# Patient Record
Sex: Female | Born: 1940 | Race: White | Hispanic: No | Smoking: Former smoker
Health system: Southern US, Community
[De-identification: ages and names within clinical notes are randomized; demographics above are authoritative.]

## PROBLEM LIST (undated history)

## (undated) DIAGNOSIS — K219 Gastro-esophageal reflux disease without esophagitis: Secondary | ICD-10-CM

## (undated) DIAGNOSIS — J449 Chronic obstructive pulmonary disease, unspecified: Secondary | ICD-10-CM

## (undated) DIAGNOSIS — I4891 Unspecified atrial fibrillation: Secondary | ICD-10-CM

## (undated) DIAGNOSIS — I10 Essential (primary) hypertension: Secondary | ICD-10-CM

---

## 2000-07-09 ENCOUNTER — Other Ambulatory Visit: Admission: RE | Admit: 2000-07-09 | Discharge: 2000-07-09 | Payer: Self-pay | Admitting: Obstetrics and Gynecology

## 2001-11-05 ENCOUNTER — Other Ambulatory Visit: Admission: RE | Admit: 2001-11-05 | Discharge: 2001-11-05 | Payer: Self-pay | Admitting: Obstetrics and Gynecology

## 2002-11-09 ENCOUNTER — Other Ambulatory Visit: Admission: RE | Admit: 2002-11-09 | Discharge: 2002-11-09 | Payer: Self-pay | Admitting: Obstetrics and Gynecology

## 2005-05-18 ENCOUNTER — Other Ambulatory Visit: Admission: RE | Admit: 2005-05-18 | Discharge: 2005-05-18 | Payer: Self-pay | Admitting: Obstetrics and Gynecology

## 2005-08-17 ENCOUNTER — Ambulatory Visit (HOSPITAL_BASED_OUTPATIENT_CLINIC_OR_DEPARTMENT_OTHER): Admission: RE | Admit: 2005-08-17 | Discharge: 2005-08-17 | Payer: Self-pay | Admitting: Urology

## 2006-05-17 ENCOUNTER — Ambulatory Visit (HOSPITAL_COMMUNITY): Admission: RE | Admit: 2006-05-17 | Discharge: 2006-05-17 | Payer: Self-pay | Admitting: *Deleted

## 2011-07-13 ENCOUNTER — Other Ambulatory Visit: Payer: Self-pay | Admitting: Gynecology

## 2014-04-26 ENCOUNTER — Emergency Department (HOSPITAL_COMMUNITY)
Admission: EM | Admit: 2014-04-26 | Discharge: 2014-04-26 | Disposition: A | Discharge status: Home / Self Care | Payer: Medicare Other | Source: Home / Self Care | Attending: Family Medicine | Admitting: Family Medicine

## 2014-04-26 ENCOUNTER — Encounter (HOSPITAL_COMMUNITY): Payer: Self-pay | Admitting: Emergency Medicine

## 2014-04-26 DIAGNOSIS — S61219A Laceration without foreign body of unspecified finger without damage to nail, initial encounter: Secondary | ICD-10-CM | POA: Diagnosis not present

## 2014-04-26 HISTORY — DX: Essential (primary) hypertension: I10

## 2014-04-26 NOTE — ED Notes (Signed)
Pt states she cut her right finger on a can of beans about 1100 this morning.  Pt denies any pain.

## 2014-04-26 NOTE — Discharge Instructions (Signed)
Keep clean and dry, avoid water for 2-3 days, return as needed.

## 2014-04-26 NOTE — ED Provider Notes (Signed)
CSN: [id number]     Arrival date & time 04/26/14  1223 History   First MD Initiated Contact with Patient 04/26/14 1357     Chief Complaint  Patient presents with  . Laceration    right finger   (Consider location/radiation/quality/duration/timing/severity/associated sxs/prior Treatment) Patient is a 74 y.o. female presenting with skin laceration. The history is provided by the patient.  Laceration Location:  Finger Finger laceration location:  R index finger Depth:  Cutaneous Quality: straight   Bleeding: controlled   Time since incident:  3 hours Laceration mechanism:  Metal edge Foreign body present:  No foreign bodies Tetanus status:  Up to date   Past Medical History  Diagnosis Date  . Hypertension    History reviewed. No pertinent past surgical history. History reviewed. No pertinent family history. History  Substance Use Topics  . Smoking status: Never Smoker   . Smokeless tobacco: Never Used  . Alcohol Use: No   OB History    No data available     Review of Systems  Constitutional: Negative.   Skin: Positive for wound.    Allergies  Review of patient's allergies indicates no known allergies.  Home Medications   Prior to Admission medications   Medication Sig Start Date End Date Taking? Authorizing Provider  lisinopril (PRINIVIL,ZESTRIL) 10 MG tablet Take 10 mg by mouth daily.   Yes Historical Provider, MD   BP 165/80 mmHg  Pulse 79  Temp(Src) 97.9 F (36.6 C) (Oral)  Resp 14  SpO2 95% Physical Exam  Constitutional: She is oriented to person, place, and time. She appears well-developed and well-nourished.  Musculoskeletal: Normal range of motion.  Neurological: She is alert and oriented to person, place, and time.  Skin: Skin is warm and dry.  Volar distal phalanx lac of rif., nvt fxn intact.  Nursing note and vitals reviewed.   ED Course  LACERATION REPAIR Date/Time: 04/26/2014 2:04 PM Performed by: Linna Hoff Authorized by: Bradd Canary  D Consent: Verbal consent obtained. Risks and benefits: risks, benefits and alternatives were discussed Consent given by: patient Body area: upper extremity Location details: right index finger Laceration length: 1.5 cm Foreign bodies: no foreign bodies Tendon involvement: none Nerve involvement: none Vascular damage: yes Patient sedated: no Preparation: Patient was prepped and draped in the usual sterile fashion. Irrigation solution: saline Amount of cleaning: standard Debridement: minimal Degree of undermining: none Skin closure: glue Technique: simple Approximation: close Approximation difficulty: simple Patient tolerance: Patient tolerated the procedure well with no immediate complications   (including critical care time) Labs Review Labs Reviewed - No data to display  Imaging Review No results found.   MDM   1. Finger laceration, initial encounter        Linna Hoff, MD 04/26/14 1415

## 2014-07-15 DIAGNOSIS — H40013 Open angle with borderline findings, low risk, bilateral: Secondary | ICD-10-CM | POA: Diagnosis not present

## 2014-10-07 DIAGNOSIS — M859 Disorder of bone density and structure, unspecified: Secondary | ICD-10-CM | POA: Diagnosis not present

## 2014-10-07 DIAGNOSIS — I10 Essential (primary) hypertension: Secondary | ICD-10-CM | POA: Diagnosis not present

## 2014-10-14 DIAGNOSIS — M8580 Other specified disorders of bone density and structure, unspecified site: Secondary | ICD-10-CM | POA: Diagnosis not present

## 2014-10-14 DIAGNOSIS — I129 Hypertensive chronic kidney disease with stage 1 through stage 4 chronic kidney disease, or unspecified chronic kidney disease: Secondary | ICD-10-CM | POA: Diagnosis not present

## 2014-10-14 DIAGNOSIS — E6609 Other obesity due to excess calories: Secondary | ICD-10-CM | POA: Diagnosis not present

## 2014-10-14 DIAGNOSIS — N182 Chronic kidney disease, stage 2 (mild): Secondary | ICD-10-CM | POA: Diagnosis not present

## 2015-01-13 DIAGNOSIS — H40013 Open angle with borderline findings, low risk, bilateral: Secondary | ICD-10-CM | POA: Diagnosis not present

## 2015-06-02 DIAGNOSIS — M4316 Spondylolisthesis, lumbar region: Secondary | ICD-10-CM | POA: Diagnosis not present

## 2015-06-02 DIAGNOSIS — M5136 Other intervertebral disc degeneration, lumbar region: Secondary | ICD-10-CM | POA: Diagnosis not present

## 2015-06-02 DIAGNOSIS — M4806 Spinal stenosis, lumbar region: Secondary | ICD-10-CM | POA: Diagnosis not present

## 2015-06-14 DIAGNOSIS — M4806 Spinal stenosis, lumbar region: Secondary | ICD-10-CM | POA: Diagnosis not present

## 2015-06-16 DIAGNOSIS — I129 Hypertensive chronic kidney disease with stage 1 through stage 4 chronic kidney disease, or unspecified chronic kidney disease: Secondary | ICD-10-CM | POA: Diagnosis not present

## 2015-06-16 DIAGNOSIS — N182 Chronic kidney disease, stage 2 (mild): Secondary | ICD-10-CM | POA: Diagnosis not present

## 2015-06-16 DIAGNOSIS — M8580 Other specified disorders of bone density and structure, unspecified site: Secondary | ICD-10-CM | POA: Diagnosis not present

## 2015-06-20 DIAGNOSIS — E875 Hyperkalemia: Secondary | ICD-10-CM | POA: Diagnosis not present

## 2015-06-20 DIAGNOSIS — Z0000 Encounter for general adult medical examination without abnormal findings: Secondary | ICD-10-CM | POA: Diagnosis not present

## 2015-06-20 DIAGNOSIS — R7989 Other specified abnormal findings of blood chemistry: Secondary | ICD-10-CM | POA: Diagnosis not present

## 2015-06-30 DIAGNOSIS — Z0000 Encounter for general adult medical examination without abnormal findings: Secondary | ICD-10-CM | POA: Diagnosis not present

## 2015-06-30 DIAGNOSIS — E875 Hyperkalemia: Secondary | ICD-10-CM | POA: Diagnosis not present

## 2015-06-30 DIAGNOSIS — R7989 Other specified abnormal findings of blood chemistry: Secondary | ICD-10-CM | POA: Diagnosis not present

## 2015-06-30 DIAGNOSIS — I10 Essential (primary) hypertension: Secondary | ICD-10-CM | POA: Diagnosis not present

## 2015-07-02 DIAGNOSIS — Z1212 Encounter for screening for malignant neoplasm of rectum: Secondary | ICD-10-CM | POA: Diagnosis not present

## 2015-07-02 DIAGNOSIS — Z1211 Encounter for screening for malignant neoplasm of colon: Secondary | ICD-10-CM | POA: Diagnosis not present

## 2015-08-03 DIAGNOSIS — E0590 Thyrotoxicosis, unspecified without thyrotoxic crisis or storm: Secondary | ICD-10-CM | POA: Diagnosis not present

## 2015-08-03 DIAGNOSIS — I10 Essential (primary) hypertension: Secondary | ICD-10-CM | POA: Diagnosis not present

## 2015-08-04 ENCOUNTER — Other Ambulatory Visit (HOSPITAL_COMMUNITY): Payer: Self-pay | Admitting: Internal Medicine

## 2015-08-04 ENCOUNTER — Other Ambulatory Visit (HOSPITAL_COMMUNITY): Payer: Self-pay | Admitting: Endocrinology

## 2015-08-04 DIAGNOSIS — E0590 Thyrotoxicosis, unspecified without thyrotoxic crisis or storm: Secondary | ICD-10-CM

## 2015-08-15 ENCOUNTER — Encounter (HOSPITAL_COMMUNITY)
Admission: RE | Admit: 2015-08-15 | Discharge: 2015-08-15 | Disposition: A | Discharge status: Home / Self Care | Payer: Medicare Other | Source: Ambulatory Visit | Attending: Internal Medicine | Admitting: Internal Medicine

## 2015-08-15 DIAGNOSIS — E0590 Thyrotoxicosis, unspecified without thyrotoxic crisis or storm: Secondary | ICD-10-CM | POA: Insufficient documentation

## 2015-08-15 MED ORDER — SODIUM IODIDE I 131 CAPSULE
9.4000 | Freq: Once | INTRAVENOUS | Status: AC | PRN
  Administered 2015-08-15: 9.4 via ORAL

## 2015-08-16 ENCOUNTER — Encounter (HOSPITAL_COMMUNITY)
Admission: RE | Admit: 2015-08-16 | Discharge: 2015-08-16 | Disposition: A | Discharge status: Home / Self Care | Payer: Medicare Other | Source: Ambulatory Visit | Attending: Internal Medicine | Admitting: Internal Medicine

## 2015-08-16 DIAGNOSIS — E0590 Thyrotoxicosis, unspecified without thyrotoxic crisis or storm: Secondary | ICD-10-CM | POA: Diagnosis not present

## 2015-08-16 MED ORDER — SODIUM PERTECHNETATE TC 99M INJECTION
9.2800 | Freq: Once | INTRAVENOUS | Status: AC | PRN
  Administered 2015-08-16: 9 via INTRAVENOUS

## 2015-08-24 DIAGNOSIS — H40053 Ocular hypertension, bilateral: Secondary | ICD-10-CM | POA: Diagnosis not present

## 2015-08-24 DIAGNOSIS — H40013 Open angle with borderline findings, low risk, bilateral: Secondary | ICD-10-CM | POA: Diagnosis not present

## 2015-09-29 DIAGNOSIS — E0590 Thyrotoxicosis, unspecified without thyrotoxic crisis or storm: Secondary | ICD-10-CM | POA: Diagnosis not present

## 2015-09-29 DIAGNOSIS — I10 Essential (primary) hypertension: Secondary | ICD-10-CM | POA: Diagnosis not present

## 2015-10-03 DIAGNOSIS — I10 Essential (primary) hypertension: Secondary | ICD-10-CM | POA: Diagnosis not present

## 2015-10-03 DIAGNOSIS — E0590 Thyrotoxicosis, unspecified without thyrotoxic crisis or storm: Secondary | ICD-10-CM | POA: Diagnosis not present

## 2015-10-11 ENCOUNTER — Encounter (HOSPITAL_COMMUNITY): Payer: Self-pay | Admitting: Family Medicine

## 2015-10-11 ENCOUNTER — Emergency Department (HOSPITAL_COMMUNITY)
Admission: EM | Admit: 2015-10-11 | Discharge: 2015-10-11 | Disposition: A | Discharge status: Home / Self Care | Payer: Medicare Other | Attending: Emergency Medicine | Admitting: Emergency Medicine

## 2015-10-11 ENCOUNTER — Emergency Department (HOSPITAL_COMMUNITY): Payer: Medicare Other

## 2015-10-11 DIAGNOSIS — S2231XA Fracture of one rib, right side, initial encounter for closed fracture: Secondary | ICD-10-CM

## 2015-10-11 DIAGNOSIS — I10 Essential (primary) hypertension: Secondary | ICD-10-CM | POA: Insufficient documentation

## 2015-10-11 DIAGNOSIS — S299XXA Unspecified injury of thorax, initial encounter: Secondary | ICD-10-CM | POA: Diagnosis present

## 2015-10-11 DIAGNOSIS — X509XXA Other and unspecified overexertion or strenuous movements or postures, initial encounter: Secondary | ICD-10-CM | POA: Insufficient documentation

## 2015-10-11 DIAGNOSIS — Y939 Activity, unspecified: Secondary | ICD-10-CM | POA: Diagnosis not present

## 2015-10-11 DIAGNOSIS — Y929 Unspecified place or not applicable: Secondary | ICD-10-CM | POA: Diagnosis not present

## 2015-10-11 DIAGNOSIS — Y990 Civilian activity done for income or pay: Secondary | ICD-10-CM | POA: Insufficient documentation

## 2015-10-11 IMAGING — DX DG RIBS W/ CHEST 3+V*R*
3 series · 3 of 3 positions shown · non-contrast
Comparison: [DATE]

CLINICAL DATA: Right rib pain after reaching and stocking shelves
at work.

EXAM:
RIGHT RIBS AND CHEST - 3+ VIEW

[chest pa]
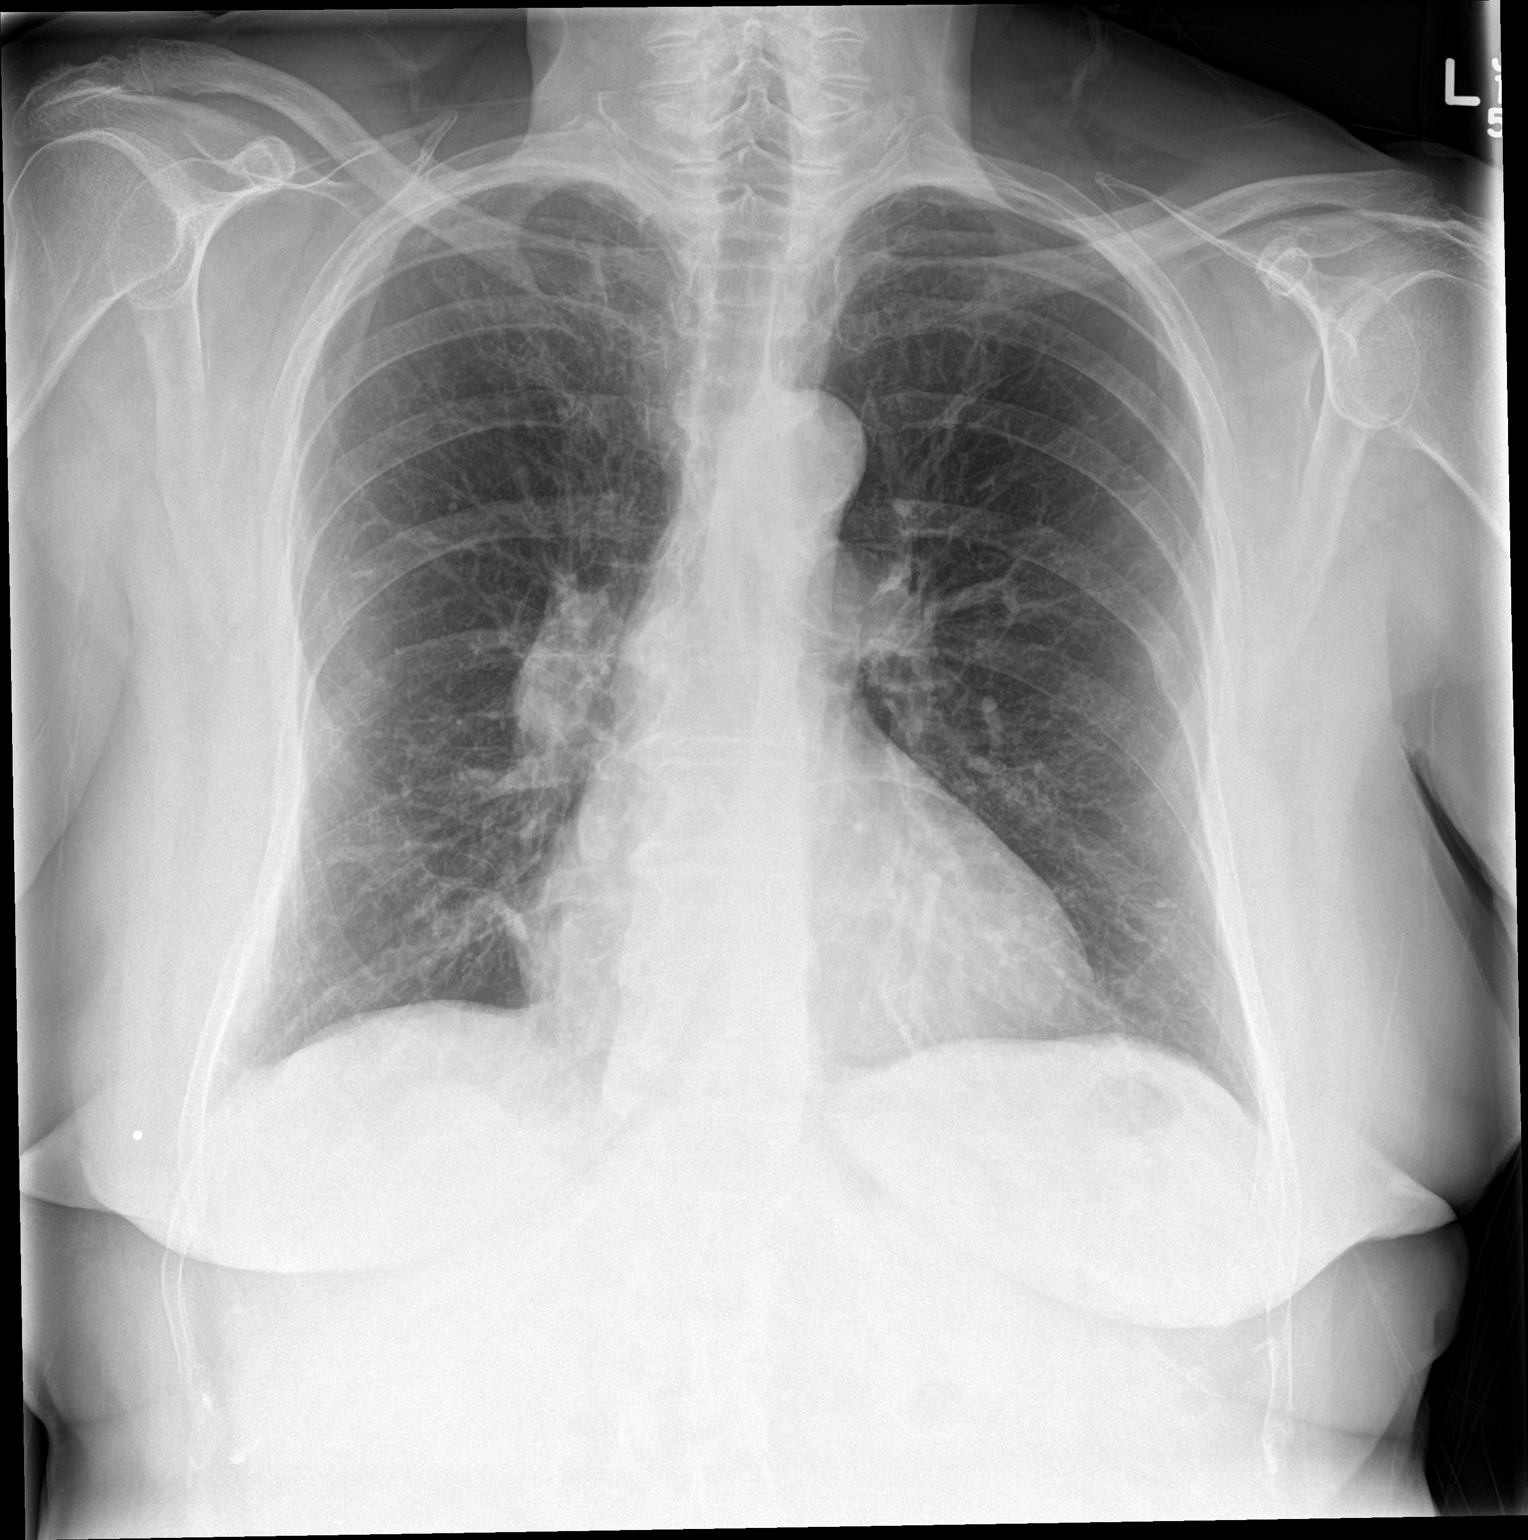

[rib ap]
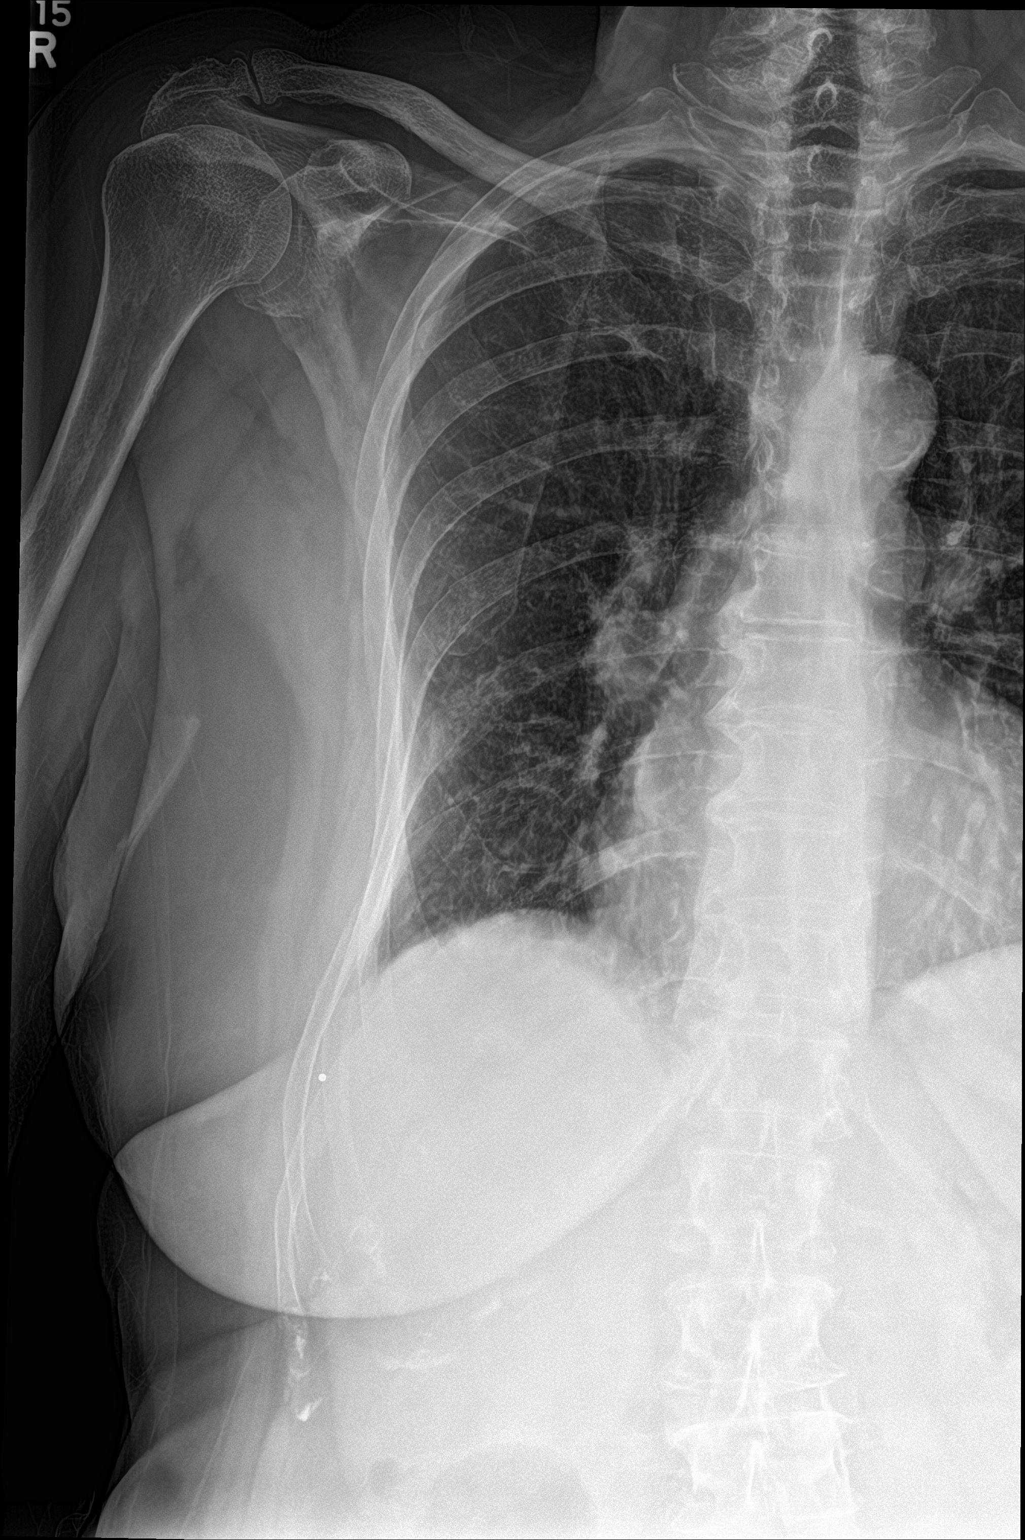

[rib ap obl]
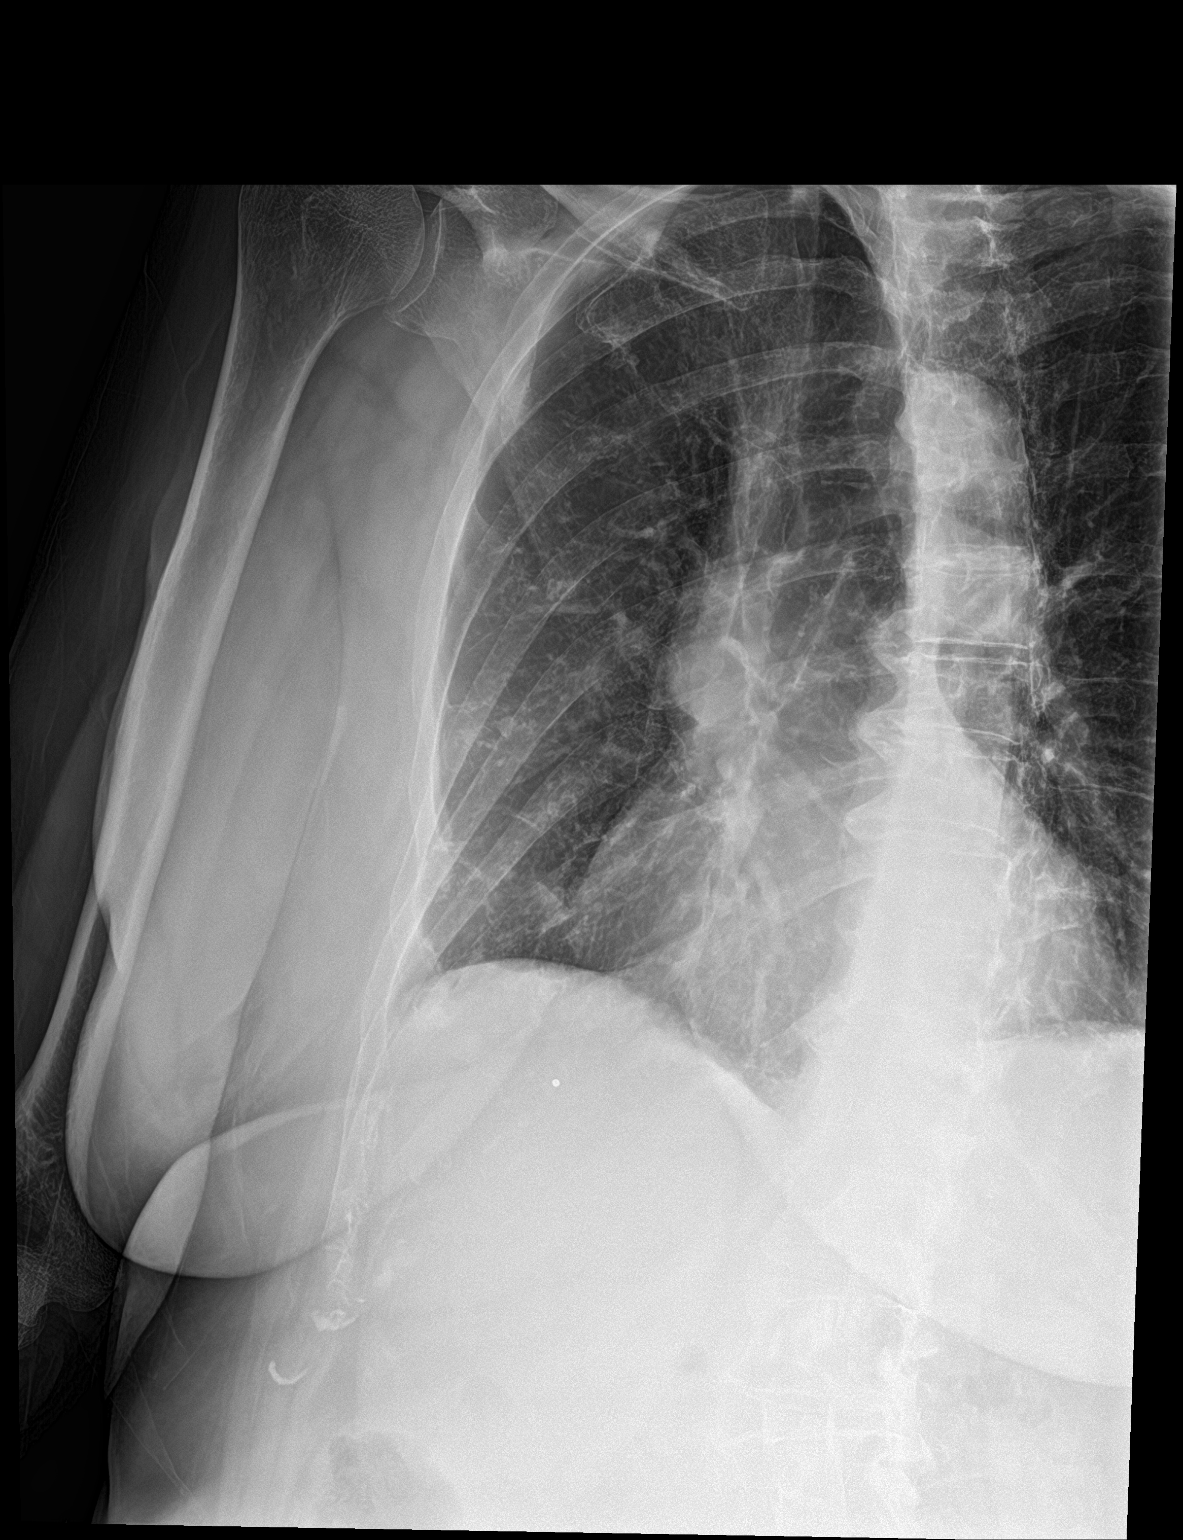

[3 of 3 positions shown; findings below may reference images not displayed]

FINDINGS: Three views right ribs submitted. No infiltrate or pulmonary edema.
There is subtle nondisplaced fracture of the right seventh rib. No
pneumothorax. Question nondisplaced fracture or old fracture of the
left seventh rib.
IMPRESSION: Subtle nondisplaced fracture of the right seventh rib . No
pneumothorax. Question nondisplaced fracture or old fracture of the
left seventh rib.

## 2015-10-11 MED ORDER — METHOCARBAMOL 500 MG PO TABS
1000.0000 mg | ORAL_TABLET | Freq: Once | ORAL | Status: AC
  Administered 2015-10-11: 1000 mg via ORAL
  Filled 2015-10-11: qty 2

## 2015-10-11 MED ORDER — ACETAMINOPHEN 325 MG PO TABS
650.0000 mg | ORAL_TABLET | Freq: Once | ORAL | Status: AC
Start: 2015-10-11 — End: 2015-10-11
  Administered 2015-10-11: 650 mg via ORAL
  Filled 2015-10-11: qty 2

## 2015-10-11 MED ORDER — ACETAMINOPHEN 325 MG PO TABS: 650.0000 mg | ORAL_TABLET | Freq: Four times a day (QID) | ORAL | Status: DC | PRN

## 2015-10-11 MED ORDER — NAPROXEN 500 MG PO TABS: 500.0000 mg | ORAL_TABLET | Freq: Two times a day (BID) | ORAL | Status: DC

## 2015-10-11 MED ORDER — METHOCARBAMOL 500 MG PO TABS: 500.0000 mg | ORAL_TABLET | Freq: Two times a day (BID) | ORAL | Status: DC

## 2015-10-11 NOTE — ED Provider Notes (Signed)
Bianca Wolf is a 75 year old lady who had a popping sensation while reaching for something on a shelf at work. Here on chest x-Bianca Wolf she has a right rib fracture. It is nondisplaced. She is moving and breathing without difficulty. Her lung exam is clear with good breath sounds bilaterally. She's advised regarding incentive spirometry use, pain management, and return precautions and voices understanding.  I performed a history and physical examination of Bianca Wolf and discussed her management with Bianca Wolf.  I agree with the history, physical, assessment, and plan of care, with the following exceptions: None  I was present for the following procedures: None Time Spent in Critical Care of the patient: None Time spent in discussions with the patient and family: 7  Bianca Wolf    Bianca Harral, MD 10/11/15 [id number]

## 2015-10-11 NOTE — ED Provider Notes (Signed)
CSN: [id number]     Arrival date & time 10/11/15  1654 History  By signing my name below, I, Bianca Wolf, attest that this documentation has been prepared under the direction and in the presence of non-physician practitioner, Melton Krebs, [place]. Electronically Signed: Marisue Wolf, Scribe. 10/11/2015. 5:49 PM.   Chief Complaint  Patient presents with  . Back Pain    The history is provided by the patient. No language interpreter was used.   HPI Comments:  Bianca Wolf is a 75 y.o. female who presents to the Emergency Department complaining of sudden onset right rib pain and upper back pain, worse with certain movements and cough. 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 No alleviating factors noted or treatments attempted PTA. Denies shortness of breath, chest pain, nausea, heart history besides HTN.  Past Medical History  Diagnosis Date  . Hypertension    History reviewed. No pertinent past surgical history. History reviewed. No pertinent family history. Social History  Substance Use Topics  . Smoking status: Never Smoker   . Smokeless tobacco: Never Used  . Alcohol Use: No   OB History    No data available     Review of Systems 10 systems reviewed and all are negative for acute change except as noted in the HPI.  Allergies  Review of patient's allergies indicates no known allergies.  Home Medications   Prior to Admission medications   Medication Sig Start Date End Date Taking? Authorizing Provider  lisinopril (PRINIVIL,ZESTRIL) 10 MG tablet Take 10 mg by mouth daily.    Historical Provider, MD   BP 142/52 mmHg  Pulse 63  Temp(Src) 98.6 F (37 C) (Oral)  Resp 18  SpO2 99%   Physical Exam  Constitutional: She is oriented to person, place, and time. She appears well-developed and well-nourished.  HENT:  Head: Normocephalic and atraumatic.  Eyes: Conjunctivae and EOM are normal.  Neck: Normal range of  motion. No tracheal deviation present.  Cardiovascular: Normal rate, regular rhythm and normal heart sounds.  Exam reveals no gallop and no friction rub.   No murmur heard. Pulmonary/Chest: Effort normal and breath sounds normal. No stridor. No respiratory distress. She has no wheezes. She has no rales. She exhibits tenderness.    Abdominal: She exhibits no distension.  Musculoskeletal: Normal range of motion.  Neurological: She is alert and oriented to person, place, and time.  Psychiatric: She has a normal mood and affect.  Nursing note and vitals reviewed.   ED Course  Procedures  DIAGNOSTIC STUDIES:  Oxygen Saturation is 99% on RA, normal by my interpretation.    COORDINATION OF CARE:  5:48 PM Will order x-ray of right ribs with chest. Discussed treatment plan with pt at bedside and pt agreed to plan.  6:34 PM Informed pt of imaging results.  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 EXAM: RIGHT RIBS AND CHEST - 3+ VIEW COMPARISON:  07/24/2006 FINDINGS: Three views right ribs submitted. No infiltrate or pulmonary edema. There is subtle nondisplaced fracture of the right seventh rib. No pneumothorax. Question nondisplaced fracture or old fracture of the left seventh rib. IMPRESSION: Subtle nondisplaced fracture of the right seventh rib . No pneumothorax. Question nondisplaced fracture or old fracture of the left seventh rib. Electronically Signed   By: Natasha Mead M.D.   On: 10/11/2015 18:16  I have personally reviewed and evaluated these images and lab results as part of my medical decision-making.  MDM   Final diagnoses:  Rib fracture, right, closed, initial encounter   Patient nontoxic appearing, VSS. Per xrays, patient with subtle nondisplaced fracture of the right seventh rib. No pneumothorax. Questionable nondisplaced fracture or old fracture of the left seventh rib. She is not  hypoxic, no signs of respiratory distress. Patient resting comfortably upon exam. Patient given spirometer and symptomatic treatment at discharge. Patient may be safely discharged home. Discussed reasons for return. Patient to follow-up with primary care provider within one week. Patient in understanding and agreement with the plan.  Patient also evaluated by Dr. Rosalia Hammersay who agrees with above plan.   I personally performed the services described in this documentation, which was scribed in my presence. The recorded information has been reviewed and is accurate.  Melton KrebsSamantha Nicole Laretta Pyatt, PA-C 10/21/15 1035  Margarita Grizzleanielle Ray, MD 11/10/15 [id number]

## 2015-10-11 NOTE — Discharge Instructions (Signed)
Ms. Bianca Wolf,  Nice meeting you! Please follow-up with your primary care provider within 3 days. Return to the emergency department if you develop fevers, cough, increased pain, new/worsening symptoms. Feel better soon!  S. Lane Hacker, PA-C Rib Fracture A rib fracture is a break or crack in one of the bones of the ribs. The ribs are a group of long, curved bones that wrap around your chest and attach to your spine. They protect your lungs and other organs in the chest cavity. A broken or cracked rib is often painful, but most do not cause other problems. Most rib fractures heal on their own over time. However, rib fractures can be more serious if multiple ribs are broken or if broken ribs move out of place and push against other structures. CAUSES   A direct blow to the chest. For example, this could happen during contact sports, a car accident, or a fall against a hard object.  Repetitive movements with high force, such as pitching a baseball or having severe coughing spells. SYMPTOMS   Pain when you breathe in or cough.  Pain when someone presses on the injured area. DIAGNOSIS  Your caregiver will perform a physical exam. Various imaging tests may be ordered to confirm the diagnosis and to look for related injuries. These tests may include a chest X-ray, computed tomography (CT), magnetic resonance imaging (MRI), or a bone scan. TREATMENT  Rib fractures usually heal on their own in 1-3 months. The longer healing period is often associated with a continued cough or other aggravating activities. During the healing period, pain control is very important. Medication is usually given to control pain. Hospitalization or surgery may be needed for more severe injuries, such as those in which multiple ribs are broken or the ribs have moved out of place.  HOME CARE INSTRUCTIONS   Avoid strenuous activity and any activities or movements that cause pain. Be careful during activities and avoid  bumping the injured rib.  Gradually increase activity as directed by your caregiver.  Only take over-the-counter or prescription medications as directed by your caregiver. Do not take other medications without asking your caregiver first.  Apply ice to the injured area for the first 1-2 days after you have been treated or as directed by your caregiver. Applying ice helps to reduce inflammation and pain.  Put ice in a plastic bag.  Place a towel between your skin and the bag.   Leave the ice on for 15-20 minutes at a time, every 2 hours while you are awake.  Perform deep breathing as directed by your caregiver. This will help prevent pneumonia, which is a common complication of a broken rib. Your caregiver may instruct you to:  Take deep breaths several times a day.  Try to cough several times a day, holding a pillow against the injured area.  Use a device called an incentive spirometer to practice deep breathing several times a day.  Drink enough fluids to keep your urine clear or pale yellow. This will help you avoid constipation.   Do not wear a rib belt or binder. These restrict breathing, which can lead to pneumonia.  SEEK IMMEDIATE MEDICAL CARE IF:   You have a fever.   You have difficulty breathing or shortness of breath.   You develop a continual cough, or you cough up thick or bloody sputum.  You feel sick to your stomach (nausea), throw up (vomit), or have abdominal pain.   You have worsening pain  not controlled with medications.  MAKE SURE YOU:  Understand these instructions.  Will watch your condition.  Will get help right away if you are not doing well or get worse.   This information is not intended to replace advice given to you by your health care provider. Make sure you discuss any questions you have with your health care provider.   Document Released: 04/09/2005 Document Revised: 12/10/2012 Document Reviewed: 06/11/2012 Elsevier Interactive  Patient Education [employer]2016 Elsevier Inc.

## 2015-10-11 NOTE — ED Notes (Signed)
Patient transported to X-ray 

## 2015-10-11 NOTE — ED Notes (Signed)
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 sts felt like something pulled or popped.

## 2015-10-11 NOTE — ED Notes (Signed)
Educated pt on IS use

## 2015-10-19 DIAGNOSIS — S2231XD Fracture of one rib, right side, subsequent encounter for fracture with routine healing: Secondary | ICD-10-CM | POA: Diagnosis not present

## 2015-10-19 DIAGNOSIS — M8580 Other specified disorders of bone density and structure, unspecified site: Secondary | ICD-10-CM | POA: Diagnosis not present

## 2015-12-06 DIAGNOSIS — E0590 Thyrotoxicosis, unspecified without thyrotoxic crisis or storm: Secondary | ICD-10-CM | POA: Diagnosis not present

## 2015-12-13 DIAGNOSIS — M47816 Spondylosis without myelopathy or radiculopathy, lumbar region: Secondary | ICD-10-CM | POA: Diagnosis not present

## 2015-12-13 DIAGNOSIS — M4316 Spondylolisthesis, lumbar region: Secondary | ICD-10-CM | POA: Diagnosis not present

## 2015-12-13 DIAGNOSIS — M5136 Other intervertebral disc degeneration, lumbar region: Secondary | ICD-10-CM | POA: Diagnosis not present

## 2016-01-03 DIAGNOSIS — Z23 Encounter for immunization: Secondary | ICD-10-CM | POA: Diagnosis not present

## 2016-01-03 DIAGNOSIS — I10 Essential (primary) hypertension: Secondary | ICD-10-CM | POA: Diagnosis not present

## 2016-01-03 DIAGNOSIS — K219 Gastro-esophageal reflux disease without esophagitis: Secondary | ICD-10-CM | POA: Diagnosis not present

## 2016-01-10 DIAGNOSIS — M47817 Spondylosis without myelopathy or radiculopathy, lumbosacral region: Secondary | ICD-10-CM | POA: Diagnosis not present

## 2016-02-13 DIAGNOSIS — E0590 Thyrotoxicosis, unspecified without thyrotoxic crisis or storm: Secondary | ICD-10-CM | POA: Diagnosis not present

## 2016-02-13 DIAGNOSIS — I10 Essential (primary) hypertension: Secondary | ICD-10-CM | POA: Diagnosis not present

## 2016-02-23 DIAGNOSIS — H40053 Ocular hypertension, bilateral: Secondary | ICD-10-CM | POA: Diagnosis not present

## 2016-04-05 DIAGNOSIS — E0590 Thyrotoxicosis, unspecified without thyrotoxic crisis or storm: Secondary | ICD-10-CM | POA: Diagnosis not present

## 2016-04-12 DIAGNOSIS — E0590 Thyrotoxicosis, unspecified without thyrotoxic crisis or storm: Secondary | ICD-10-CM | POA: Diagnosis not present

## 2016-07-04 DIAGNOSIS — I10 Essential (primary) hypertension: Secondary | ICD-10-CM | POA: Diagnosis not present

## 2016-07-04 DIAGNOSIS — Z0000 Encounter for general adult medical examination without abnormal findings: Secondary | ICD-10-CM | POA: Diagnosis not present

## 2016-07-04 DIAGNOSIS — N390 Urinary tract infection, site not specified: Secondary | ICD-10-CM | POA: Diagnosis not present

## 2016-07-04 DIAGNOSIS — E559 Vitamin D deficiency, unspecified: Secondary | ICD-10-CM | POA: Diagnosis not present

## 2016-07-11 DIAGNOSIS — M8589 Other specified disorders of bone density and structure, multiple sites: Secondary | ICD-10-CM | POA: Diagnosis not present

## 2016-07-11 DIAGNOSIS — N390 Urinary tract infection, site not specified: Secondary | ICD-10-CM | POA: Diagnosis not present

## 2016-07-11 DIAGNOSIS — Z0000 Encounter for general adult medical examination without abnormal findings: Secondary | ICD-10-CM | POA: Diagnosis not present

## 2016-07-11 DIAGNOSIS — I10 Essential (primary) hypertension: Secondary | ICD-10-CM | POA: Diagnosis not present

## 2016-07-11 DIAGNOSIS — K219 Gastro-esophageal reflux disease without esophagitis: Secondary | ICD-10-CM | POA: Diagnosis not present

## 2016-07-30 DIAGNOSIS — M85859 Other specified disorders of bone density and structure, unspecified thigh: Secondary | ICD-10-CM | POA: Diagnosis not present

## 2016-08-31 DIAGNOSIS — H2513 Age-related nuclear cataract, bilateral: Secondary | ICD-10-CM | POA: Diagnosis not present

## 2016-08-31 DIAGNOSIS — H40013 Open angle with borderline findings, low risk, bilateral: Secondary | ICD-10-CM | POA: Diagnosis not present

## 2016-10-11 DIAGNOSIS — E0590 Thyrotoxicosis, unspecified without thyrotoxic crisis or storm: Secondary | ICD-10-CM | POA: Diagnosis not present

## 2016-12-20 DIAGNOSIS — E0590 Thyrotoxicosis, unspecified without thyrotoxic crisis or storm: Secondary | ICD-10-CM | POA: Diagnosis not present

## 2017-01-14 DIAGNOSIS — E0590 Thyrotoxicosis, unspecified without thyrotoxic crisis or storm: Secondary | ICD-10-CM | POA: Diagnosis not present

## 2017-01-14 DIAGNOSIS — Z0000 Encounter for general adult medical examination without abnormal findings: Secondary | ICD-10-CM | POA: Diagnosis not present

## 2017-01-14 DIAGNOSIS — I10 Essential (primary) hypertension: Secondary | ICD-10-CM | POA: Diagnosis not present

## 2017-01-15 DIAGNOSIS — K219 Gastro-esophageal reflux disease without esophagitis: Secondary | ICD-10-CM | POA: Diagnosis not present

## 2017-01-15 DIAGNOSIS — E0590 Thyrotoxicosis, unspecified without thyrotoxic crisis or storm: Secondary | ICD-10-CM | POA: Diagnosis not present

## 2017-01-15 DIAGNOSIS — I10 Essential (primary) hypertension: Secondary | ICD-10-CM | POA: Diagnosis not present

## 2017-01-15 DIAGNOSIS — E781 Pure hyperglyceridemia: Secondary | ICD-10-CM | POA: Diagnosis not present

## 2017-04-02 DIAGNOSIS — E0590 Thyrotoxicosis, unspecified without thyrotoxic crisis or storm: Secondary | ICD-10-CM | POA: Diagnosis not present

## 2017-06-12 DIAGNOSIS — N390 Urinary tract infection, site not specified: Secondary | ICD-10-CM | POA: Diagnosis not present

## 2017-07-08 DIAGNOSIS — E559 Vitamin D deficiency, unspecified: Secondary | ICD-10-CM | POA: Diagnosis not present

## 2017-07-08 DIAGNOSIS — E0590 Thyrotoxicosis, unspecified without thyrotoxic crisis or storm: Secondary | ICD-10-CM | POA: Diagnosis not present

## 2017-07-08 DIAGNOSIS — I10 Essential (primary) hypertension: Secondary | ICD-10-CM | POA: Diagnosis not present

## 2017-07-09 DIAGNOSIS — Z0000 Encounter for general adult medical examination without abnormal findings: Secondary | ICD-10-CM | POA: Diagnosis not present

## 2017-07-16 DIAGNOSIS — K219 Gastro-esophageal reflux disease without esophagitis: Secondary | ICD-10-CM | POA: Diagnosis not present

## 2017-07-16 DIAGNOSIS — M8589 Other specified disorders of bone density and structure, multiple sites: Secondary | ICD-10-CM | POA: Diagnosis not present

## 2017-07-16 DIAGNOSIS — I10 Essential (primary) hypertension: Secondary | ICD-10-CM | POA: Diagnosis not present

## 2017-07-16 DIAGNOSIS — Z0000 Encounter for general adult medical examination without abnormal findings: Secondary | ICD-10-CM | POA: Diagnosis not present

## 2017-07-18 DIAGNOSIS — H40053 Ocular hypertension, bilateral: Secondary | ICD-10-CM | POA: Diagnosis not present

## 2017-07-18 DIAGNOSIS — H40013 Open angle with borderline findings, low risk, bilateral: Secondary | ICD-10-CM | POA: Diagnosis not present

## 2017-09-12 DIAGNOSIS — E0590 Thyrotoxicosis, unspecified without thyrotoxic crisis or storm: Secondary | ICD-10-CM | POA: Diagnosis not present

## 2017-10-18 DIAGNOSIS — H40053 Ocular hypertension, bilateral: Secondary | ICD-10-CM | POA: Diagnosis not present

## 2017-10-18 DIAGNOSIS — H40013 Open angle with borderline findings, low risk, bilateral: Secondary | ICD-10-CM | POA: Diagnosis not present

## 2017-11-25 DIAGNOSIS — N390 Urinary tract infection, site not specified: Secondary | ICD-10-CM | POA: Diagnosis not present

## 2017-11-25 DIAGNOSIS — R300 Dysuria: Secondary | ICD-10-CM | POA: Diagnosis not present

## 2018-01-08 DIAGNOSIS — I10 Essential (primary) hypertension: Secondary | ICD-10-CM | POA: Diagnosis not present

## 2018-01-08 DIAGNOSIS — K219 Gastro-esophageal reflux disease without esophagitis: Secondary | ICD-10-CM | POA: Diagnosis not present

## 2018-01-15 DIAGNOSIS — E0590 Thyrotoxicosis, unspecified without thyrotoxic crisis or storm: Secondary | ICD-10-CM | POA: Diagnosis not present

## 2018-04-14 DIAGNOSIS — N390 Urinary tract infection, site not specified: Secondary | ICD-10-CM | POA: Diagnosis not present

## 2018-04-14 DIAGNOSIS — R300 Dysuria: Secondary | ICD-10-CM | POA: Diagnosis not present

## 2018-07-21 DIAGNOSIS — I10 Essential (primary) hypertension: Secondary | ICD-10-CM | POA: Diagnosis not present

## 2018-07-21 DIAGNOSIS — E781 Pure hyperglyceridemia: Secondary | ICD-10-CM | POA: Diagnosis not present

## 2018-07-21 DIAGNOSIS — K219 Gastro-esophageal reflux disease without esophagitis: Secondary | ICD-10-CM | POA: Diagnosis not present

## 2018-08-07 DIAGNOSIS — I10 Essential (primary) hypertension: Secondary | ICD-10-CM | POA: Diagnosis not present

## 2018-08-07 DIAGNOSIS — M8580 Other specified disorders of bone density and structure, unspecified site: Secondary | ICD-10-CM | POA: Diagnosis not present

## 2018-08-07 DIAGNOSIS — Z23 Encounter for immunization: Secondary | ICD-10-CM | POA: Diagnosis not present

## 2018-08-07 DIAGNOSIS — Z0000 Encounter for general adult medical examination without abnormal findings: Secondary | ICD-10-CM | POA: Diagnosis not present

## 2018-08-11 DIAGNOSIS — E0590 Thyrotoxicosis, unspecified without thyrotoxic crisis or storm: Secondary | ICD-10-CM | POA: Diagnosis not present

## 2018-08-11 DIAGNOSIS — E559 Vitamin D deficiency, unspecified: Secondary | ICD-10-CM | POA: Diagnosis not present

## 2018-09-15 DIAGNOSIS — Z1211 Encounter for screening for malignant neoplasm of colon: Secondary | ICD-10-CM | POA: Diagnosis not present

## 2018-09-15 DIAGNOSIS — Z1212 Encounter for screening for malignant neoplasm of rectum: Secondary | ICD-10-CM | POA: Diagnosis not present

## 2018-10-21 DIAGNOSIS — R300 Dysuria: Secondary | ICD-10-CM | POA: Diagnosis not present

## 2018-10-27 DIAGNOSIS — H25013 Cortical age-related cataract, bilateral: Secondary | ICD-10-CM | POA: Diagnosis not present

## 2018-10-27 DIAGNOSIS — H40013 Open angle with borderline findings, low risk, bilateral: Secondary | ICD-10-CM | POA: Diagnosis not present

## 2018-10-27 DIAGNOSIS — H35013 Changes in retinal vascular appearance, bilateral: Secondary | ICD-10-CM | POA: Diagnosis not present

## 2018-10-27 DIAGNOSIS — H2513 Age-related nuclear cataract, bilateral: Secondary | ICD-10-CM | POA: Diagnosis not present

## 2018-12-03 DIAGNOSIS — T63481A Toxic effect of venom of other arthropod, accidental (unintentional), initial encounter: Secondary | ICD-10-CM | POA: Diagnosis not present

## 2019-03-23 DIAGNOSIS — N390 Urinary tract infection, site not specified: Secondary | ICD-10-CM | POA: Diagnosis not present

## 2019-03-23 DIAGNOSIS — R300 Dysuria: Secondary | ICD-10-CM | POA: Diagnosis not present

## 2019-05-07 DIAGNOSIS — Z1159 Encounter for screening for other viral diseases: Secondary | ICD-10-CM | POA: Diagnosis not present

## 2019-05-07 DIAGNOSIS — Z20828 Contact with and (suspected) exposure to other viral communicable diseases: Secondary | ICD-10-CM | POA: Diagnosis not present

## 2019-05-18 DIAGNOSIS — Z20828 Contact with and (suspected) exposure to other viral communicable diseases: Secondary | ICD-10-CM | POA: Diagnosis not present

## 2019-06-25 ENCOUNTER — Ambulatory Visit: Payer: Medicare HMO | Attending: Internal Medicine

## 2019-06-25 DIAGNOSIS — Z23 Encounter for immunization: Secondary | ICD-10-CM | POA: Insufficient documentation

## 2019-06-25 NOTE — Progress Notes (Signed)
   Covid-19 Vaccination Clinic  Name:  Bianca Wolf    MRN: [id number] DOB: 12-26-1940  06/25/2019  [name] was observed post Covid-19 immunization for 15 minutes without incident. She was provided with Vaccine Information Sheet and instruction to access the V-Safe system.   [name] was instructed to call 911 with any severe reactions post vaccine: Marland Kitchen Difficulty breathing  . Swelling of face and throat  . A fast heartbeat  . A bad rash all over body  . Dizziness and weakness   Immunizations Administered    Name Date Dose VIS Date Route   Pfizer COVID-19 Vaccine 06/25/2019  3:28 PM 0.3 mL 04/03/2019 Intramuscular   Manufacturer: [employer], [employer]   Lot: [id number]   NDC: [address]-2

## 2019-06-29 DIAGNOSIS — Z20828 Contact with and (suspected) exposure to other viral communicable diseases: Secondary | ICD-10-CM | POA: Diagnosis not present

## 2019-07-22 ENCOUNTER — Ambulatory Visit: Payer: Medicare HMO | Attending: Internal Medicine

## 2019-07-22 DIAGNOSIS — Z23 Encounter for immunization: Secondary | ICD-10-CM

## 2019-07-22 NOTE — Progress Notes (Signed)
   Covid-19 Vaccination Clinic  Name:  Bianca Wolf    MRN: [id number] DOB: 08/18/1940  07/22/2019  [name] was observed post Covid-19 immunization for 15 minutes without incident. She was provided with Vaccine Information Sheet and instruction to access the V-Safe system.   [name] was instructed to call 911 with any severe reactions post vaccine: Marland Kitchen Difficulty breathing  . Swelling of face and throat  . A fast heartbeat  . A bad rash all over body  . Dizziness and weakness   Immunizations Administered    Name Date Dose VIS Date Route   Pfizer COVID-19 Vaccine 07/22/2019  2:52 PM 0.3 mL 04/03/2019 Intramuscular   Manufacturer: [employer], [employer]   Lot: [id number]   NDC: [address]-8

## 2019-07-23 DIAGNOSIS — H40013 Open angle with borderline findings, low risk, bilateral: Secondary | ICD-10-CM | POA: Diagnosis not present

## 2019-07-23 DIAGNOSIS — H524 Presbyopia: Secondary | ICD-10-CM | POA: Diagnosis not present

## 2019-07-23 DIAGNOSIS — H52223 Regular astigmatism, bilateral: Secondary | ICD-10-CM | POA: Diagnosis not present

## 2019-08-10 DIAGNOSIS — I10 Essential (primary) hypertension: Secondary | ICD-10-CM | POA: Diagnosis not present

## 2019-08-10 DIAGNOSIS — E0590 Thyrotoxicosis, unspecified without thyrotoxic crisis or storm: Secondary | ICD-10-CM | POA: Diagnosis not present

## 2019-08-13 DIAGNOSIS — K219 Gastro-esophageal reflux disease without esophagitis: Secondary | ICD-10-CM | POA: Diagnosis not present

## 2019-08-13 DIAGNOSIS — I10 Essential (primary) hypertension: Secondary | ICD-10-CM | POA: Diagnosis not present

## 2019-08-13 DIAGNOSIS — E559 Vitamin D deficiency, unspecified: Secondary | ICD-10-CM | POA: Diagnosis not present

## 2019-08-13 DIAGNOSIS — Z0000 Encounter for general adult medical examination without abnormal findings: Secondary | ICD-10-CM | POA: Diagnosis not present

## 2019-08-13 DIAGNOSIS — R079 Chest pain, unspecified: Secondary | ICD-10-CM | POA: Diagnosis not present

## 2019-08-13 DIAGNOSIS — E781 Pure hyperglyceridemia: Secondary | ICD-10-CM | POA: Diagnosis not present

## 2019-08-13 DIAGNOSIS — J309 Allergic rhinitis, unspecified: Secondary | ICD-10-CM | POA: Diagnosis not present

## 2019-08-13 DIAGNOSIS — M8580 Other specified disorders of bone density and structure, unspecified site: Secondary | ICD-10-CM | POA: Diagnosis not present

## 2019-08-13 DIAGNOSIS — R0602 Shortness of breath: Secondary | ICD-10-CM | POA: Diagnosis not present

## 2019-08-13 DIAGNOSIS — E0590 Thyrotoxicosis, unspecified without thyrotoxic crisis or storm: Secondary | ICD-10-CM | POA: Diagnosis not present

## 2019-08-13 DIAGNOSIS — R0789 Other chest pain: Secondary | ICD-10-CM | POA: Diagnosis not present

## 2019-08-13 DIAGNOSIS — J45909 Unspecified asthma, uncomplicated: Secondary | ICD-10-CM | POA: Diagnosis not present

## 2019-08-27 DIAGNOSIS — M8589 Other specified disorders of bone density and structure, multiple sites: Secondary | ICD-10-CM | POA: Diagnosis not present

## 2019-08-27 DIAGNOSIS — M810 Age-related osteoporosis without current pathological fracture: Secondary | ICD-10-CM | POA: Diagnosis not present

## 2019-08-27 DIAGNOSIS — E559 Vitamin D deficiency, unspecified: Secondary | ICD-10-CM | POA: Diagnosis not present

## 2019-09-07 DIAGNOSIS — H40053 Ocular hypertension, bilateral: Secondary | ICD-10-CM | POA: Diagnosis not present

## 2019-09-07 DIAGNOSIS — H40013 Open angle with borderline findings, low risk, bilateral: Secondary | ICD-10-CM | POA: Diagnosis not present

## 2020-03-31 DIAGNOSIS — H524 Presbyopia: Secondary | ICD-10-CM | POA: Diagnosis not present

## 2020-05-16 DIAGNOSIS — H1131 Conjunctival hemorrhage, right eye: Secondary | ICD-10-CM | POA: Diagnosis not present

## 2020-05-18 DIAGNOSIS — R42 Dizziness and giddiness: Secondary | ICD-10-CM | POA: Diagnosis not present

## 2020-05-18 DIAGNOSIS — N3281 Overactive bladder: Secondary | ICD-10-CM | POA: Diagnosis not present

## 2020-05-18 DIAGNOSIS — H6123 Impacted cerumen, bilateral: Secondary | ICD-10-CM | POA: Diagnosis not present

## 2020-05-18 DIAGNOSIS — R410 Disorientation, unspecified: Secondary | ICD-10-CM | POA: Diagnosis not present

## 2020-08-10 DIAGNOSIS — I10 Essential (primary) hypertension: Secondary | ICD-10-CM | POA: Diagnosis not present

## 2020-08-10 DIAGNOSIS — E0590 Thyrotoxicosis, unspecified without thyrotoxic crisis or storm: Secondary | ICD-10-CM | POA: Diagnosis not present

## 2020-08-15 DIAGNOSIS — K219 Gastro-esophageal reflux disease without esophagitis: Secondary | ICD-10-CM | POA: Diagnosis not present

## 2020-08-15 DIAGNOSIS — J309 Allergic rhinitis, unspecified: Secondary | ICD-10-CM | POA: Diagnosis not present

## 2020-08-15 DIAGNOSIS — N3943 Post-void dribbling: Secondary | ICD-10-CM | POA: Diagnosis not present

## 2020-08-15 DIAGNOSIS — E0590 Thyrotoxicosis, unspecified without thyrotoxic crisis or storm: Secondary | ICD-10-CM | POA: Diagnosis not present

## 2020-08-15 DIAGNOSIS — I10 Essential (primary) hypertension: Secondary | ICD-10-CM | POA: Diagnosis not present

## 2020-08-15 DIAGNOSIS — Z0000 Encounter for general adult medical examination without abnormal findings: Secondary | ICD-10-CM | POA: Diagnosis not present

## 2020-08-15 DIAGNOSIS — M8580 Other specified disorders of bone density and structure, unspecified site: Secondary | ICD-10-CM | POA: Diagnosis not present

## 2020-09-13 DIAGNOSIS — R350 Frequency of micturition: Secondary | ICD-10-CM | POA: Diagnosis not present

## 2020-09-13 DIAGNOSIS — I10 Essential (primary) hypertension: Secondary | ICD-10-CM | POA: Diagnosis not present

## 2020-09-27 DIAGNOSIS — H2513 Age-related nuclear cataract, bilateral: Secondary | ICD-10-CM | POA: Diagnosis not present

## 2020-09-27 DIAGNOSIS — H25013 Cortical age-related cataract, bilateral: Secondary | ICD-10-CM | POA: Diagnosis not present

## 2020-09-27 DIAGNOSIS — H40013 Open angle with borderline findings, low risk, bilateral: Secondary | ICD-10-CM | POA: Diagnosis not present

## 2020-11-08 DIAGNOSIS — H25013 Cortical age-related cataract, bilateral: Secondary | ICD-10-CM | POA: Diagnosis not present

## 2020-11-08 DIAGNOSIS — H2513 Age-related nuclear cataract, bilateral: Secondary | ICD-10-CM | POA: Diagnosis not present

## 2020-11-08 DIAGNOSIS — H18413 Arcus senilis, bilateral: Secondary | ICD-10-CM | POA: Diagnosis not present

## 2020-11-08 DIAGNOSIS — H2511 Age-related nuclear cataract, right eye: Secondary | ICD-10-CM | POA: Diagnosis not present

## 2020-11-08 DIAGNOSIS — H25043 Posterior subcapsular polar age-related cataract, bilateral: Secondary | ICD-10-CM | POA: Diagnosis not present

## 2021-01-19 DIAGNOSIS — H25811 Combined forms of age-related cataract, right eye: Secondary | ICD-10-CM | POA: Diagnosis not present

## 2021-01-20 DIAGNOSIS — H2512 Age-related nuclear cataract, left eye: Secondary | ICD-10-CM | POA: Diagnosis not present

## 2021-01-20 DIAGNOSIS — H2511 Age-related nuclear cataract, right eye: Secondary | ICD-10-CM | POA: Diagnosis not present

## 2021-02-10 DIAGNOSIS — H2512 Age-related nuclear cataract, left eye: Secondary | ICD-10-CM | POA: Diagnosis not present

## 2021-02-10 DIAGNOSIS — H25812 Combined forms of age-related cataract, left eye: Secondary | ICD-10-CM | POA: Diagnosis not present

## 2021-04-03 DIAGNOSIS — J4521 Mild intermittent asthma with (acute) exacerbation: Secondary | ICD-10-CM | POA: Diagnosis not present

## 2021-04-04 DIAGNOSIS — J4521 Mild intermittent asthma with (acute) exacerbation: Secondary | ICD-10-CM | POA: Diagnosis not present

## 2021-04-04 DIAGNOSIS — R059 Cough, unspecified: Secondary | ICD-10-CM | POA: Diagnosis not present

## 2021-04-17 DIAGNOSIS — H52223 Regular astigmatism, bilateral: Secondary | ICD-10-CM | POA: Diagnosis not present

## 2021-04-17 DIAGNOSIS — H524 Presbyopia: Secondary | ICD-10-CM | POA: Diagnosis not present

## 2021-06-02 DIAGNOSIS — H40013 Open angle with borderline findings, low risk, bilateral: Secondary | ICD-10-CM | POA: Diagnosis not present

## 2021-06-19 DIAGNOSIS — N3281 Overactive bladder: Secondary | ICD-10-CM | POA: Diagnosis not present

## 2021-08-22 DIAGNOSIS — R300 Dysuria: Secondary | ICD-10-CM | POA: Diagnosis not present

## 2021-09-06 DIAGNOSIS — M545 Low back pain, unspecified: Secondary | ICD-10-CM | POA: Diagnosis not present

## 2021-09-06 DIAGNOSIS — S32000A Wedge compression fracture of unspecified lumbar vertebra, initial encounter for closed fracture: Secondary | ICD-10-CM | POA: Diagnosis not present

## 2021-09-07 ENCOUNTER — Ambulatory Visit: Payer: Medicare HMO | Admitting: Family Medicine

## 2021-09-07 ENCOUNTER — Encounter: Payer: Self-pay | Admitting: Family Medicine

## 2021-09-07 DIAGNOSIS — S32050A Wedge compression fracture of fifth lumbar vertebra, initial encounter for closed fracture: Secondary | ICD-10-CM

## 2021-09-07 DIAGNOSIS — Z8781 Personal history of (healed) traumatic fracture: Secondary | ICD-10-CM | POA: Insufficient documentation

## 2021-09-07 MED ORDER — LORAZEPAM 0.5 MG PO TABS: ORAL_TABLET | ORAL | 0 refills | Status: DC

## 2021-09-07 NOTE — Patient Instructions (Addendum)
Nice to meet you.  I think you have a compression fracture at L5 vertebrae.   Plan for MRI to evaluate for kyphoplasty or vertibroplasty   Follow-up after the MRI if we need to.   You should hear from MRI scheduling within 1 week. If you do not hear please let me know.

## 2021-09-07 NOTE — Progress Notes (Signed)
    Subjective:    CC: Back pain  I, Molly Weber, LAT, ATC, am serving as scribe for Dr. Lynne Leader.  HPI: Pt is an 81 y/o female c/o back pain x several years that has worsened over the last month after picking up some boxes at work. Pt locates pain to the R lower back.  She works at [employer] 30 hours/week.  Radiating pain: no LE numbness/tingling: no Aggravating factors: lumbar flexion; bed mobility; transitioning from sit-to-stand Treatments tried: Tramadol; Tylenol; Advil  Pertinent review of Systems: No fevers or chills  Relevant historical information: Osteoporosis   Objective:    Vitals:   09/07/21 1552  BP: (!) 148/88  Pulse: (!) 142  SpO2: 94%   General: Well Developed, well nourished, and in no acute distress.   MSK: L-spine: Nontender midline.  Tender palpation lumbar paraspinal musculature.  Decreased lumbar motion.  Lower extremity strength is intact.  Lab and Radiology Results  X-ray images L-spine on a CD provided by patient obtain a primary care provider office recently personally and independently interpreted today. Compression fracture at L5 vertebrae visible.  No other acute fractures are present.   Impression and Recommendations:    Assessment and Plan: 81 y.o. female with acute low back pain ongoing for about 1 month.  Based on x-ray of her lumbar spine this is concerning for an acute vertebral compression fracture at L5.  Plan on MRI to further evaluate the acuity of the fracture and for kyphoplasty or vertebroplasty planning.  She notes that she has not claustrophobia so I prescribed Ativan and we hope that we can get her through a more conventional MRI as it would be much faster.  Spent time talking about compression fractures and kyphoplasty and vertebroplasty today.  Anticipate referring directly to interventional radiology based on the results of the MRI.  Certainly she can return to clinic especially if the MRI results are  confusing. Currently she has tramadol for pain control.  Happy to refill when she needs it.  PDMP not reviewed this encounter. Orders Placed This Encounter  Procedures   MR Lumbar Spine Wo Contrast    Standing Status:   Future    Standing Expiration Date:   09/08/2022    Order Specific Question:   What is the patient's sedation requirement?    Answer:   Anti-anxiety    Order Specific Question:   Does the patient have a pacemaker or implanted devices?    Answer:   No    Order Specific Question:   Preferred imaging location?    Answer:   [facility]process development [facility] (table limit-350lbs)   Meds ordered this encounter  Medications   LORazepam (ATIVAN) 0.5 MG tablet    Sig: 1-2 tabs 30 - 60 min prior to MRI. Do not drive with this medicine.    Dispense:  4 tablet    Refill:  0    Discussed warning signs or symptoms. Please see discharge instructions. Patient expresses understanding.   The above documentation has been reviewed and is accurate and complete Lynne Leader, M.D.

## 2021-09-08 DIAGNOSIS — J45909 Unspecified asthma, uncomplicated: Secondary | ICD-10-CM | POA: Insufficient documentation

## 2021-09-08 DIAGNOSIS — E0590 Thyrotoxicosis, unspecified without thyrotoxic crisis or storm: Secondary | ICD-10-CM | POA: Insufficient documentation

## 2021-09-08 DIAGNOSIS — E781 Pure hyperglyceridemia: Secondary | ICD-10-CM | POA: Insufficient documentation

## 2021-09-08 DIAGNOSIS — N3281 Overactive bladder: Secondary | ICD-10-CM | POA: Insufficient documentation

## 2021-09-08 DIAGNOSIS — I10 Essential (primary) hypertension: Secondary | ICD-10-CM | POA: Insufficient documentation

## 2021-09-08 DIAGNOSIS — J309 Allergic rhinitis, unspecified: Secondary | ICD-10-CM | POA: Insufficient documentation

## 2021-09-08 DIAGNOSIS — E559 Vitamin D deficiency, unspecified: Secondary | ICD-10-CM | POA: Insufficient documentation

## 2021-09-08 DIAGNOSIS — R350 Frequency of micturition: Secondary | ICD-10-CM | POA: Insufficient documentation

## 2021-09-08 DIAGNOSIS — M810 Age-related osteoporosis without current pathological fracture: Secondary | ICD-10-CM | POA: Insufficient documentation

## 2021-09-08 DIAGNOSIS — K219 Gastro-esophageal reflux disease without esophagitis: Secondary | ICD-10-CM | POA: Insufficient documentation

## 2021-09-12 ENCOUNTER — Ambulatory Visit (INDEPENDENT_AMBULATORY_CARE_PROVIDER_SITE_OTHER): Payer: Medicare HMO

## 2021-09-12 DIAGNOSIS — S32050A Wedge compression fracture of fifth lumbar vertebra, initial encounter for closed fracture: Secondary | ICD-10-CM

## 2021-09-12 DIAGNOSIS — S32030A Wedge compression fracture of third lumbar vertebra, initial encounter for closed fracture: Secondary | ICD-10-CM | POA: Diagnosis not present

## 2021-09-12 IMAGING — MR MR LUMBAR SPINE W/O CM
4 of 5 series · 25 of 48 positions shown · non-contrast
Comparison: CT abdomen and pelvis [DATE]

CLINICAL DATA: Lumbar compression fracture. Low back pain after
picking up boxes 1 month ago. Bilateral leg weakness. Remote history
of lumbar surgery.

EXAM:
MRI LUMBAR SPINE WITHOUT CONTRAST
TECHNIQUE: Multiplanar, multisequence MR imaging of the lumbar spine was
performed. No intravenous contrast was administered.

[Series 3: T2 · sagittal · 4.0mm · 0.81mm/px · 6 of 15 slices shown (1 of 2)]
[im 1/15]
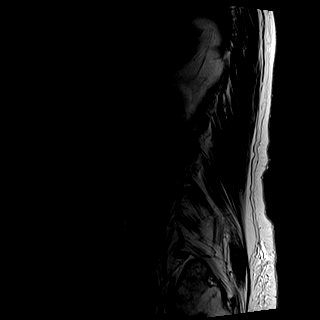
[im 3/15]
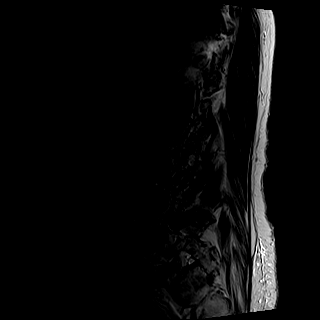
[im 6/15]
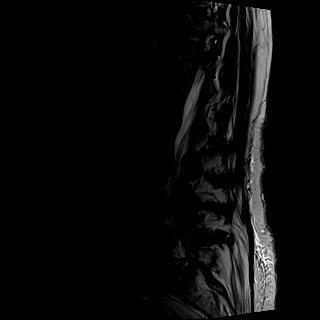
[im 9/15]
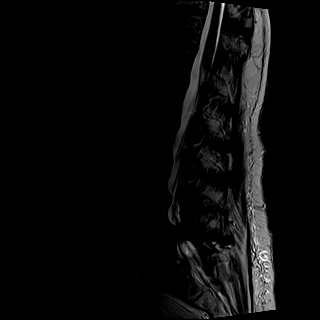
[im 12/15]
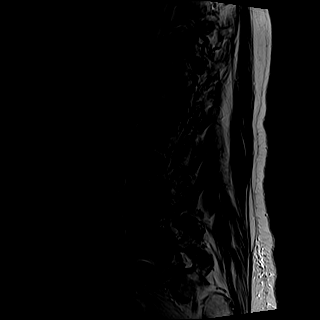
[im 15/15]
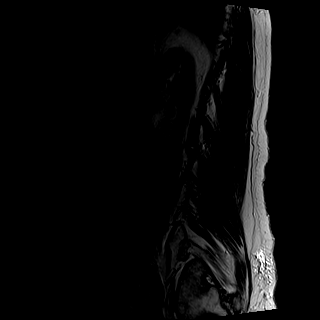

[Series 4: T1 · sagittal · 4.0mm · 0.41mm/px · 6 of 15 slices shown (1 of 2)]
[im 1/15]
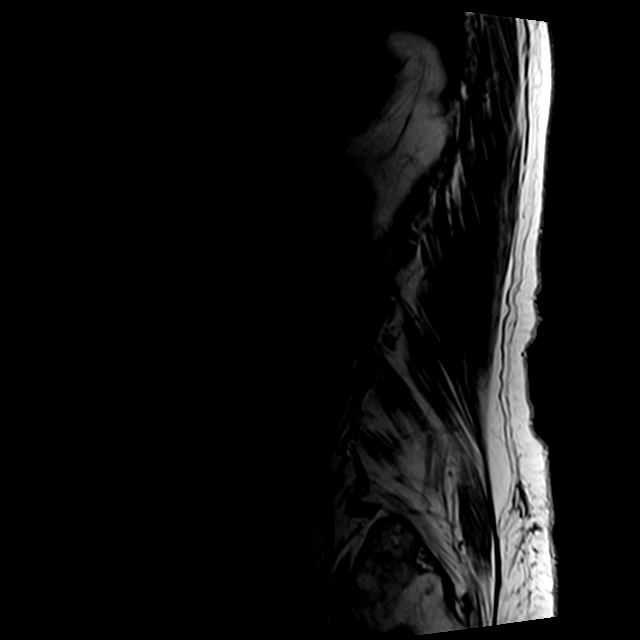
[im 3/15]
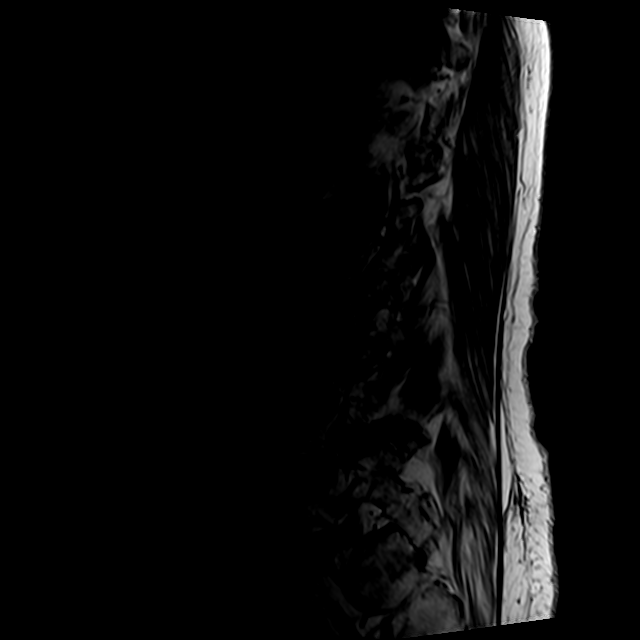
[im 6/15]
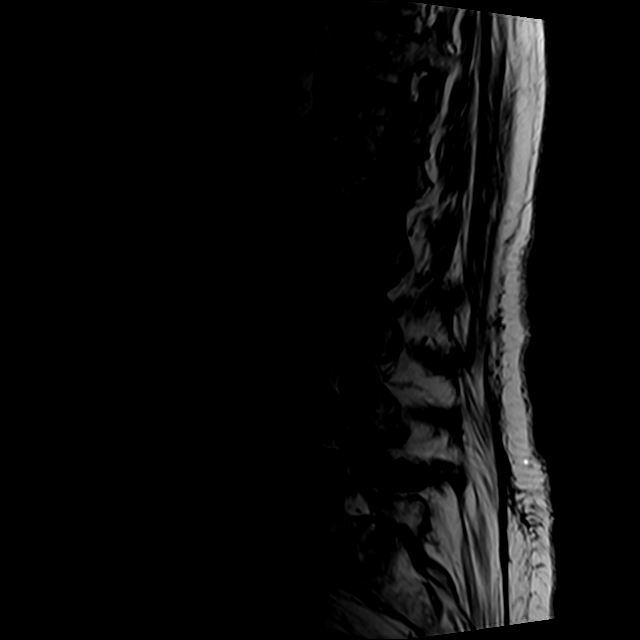
[im 9/15]
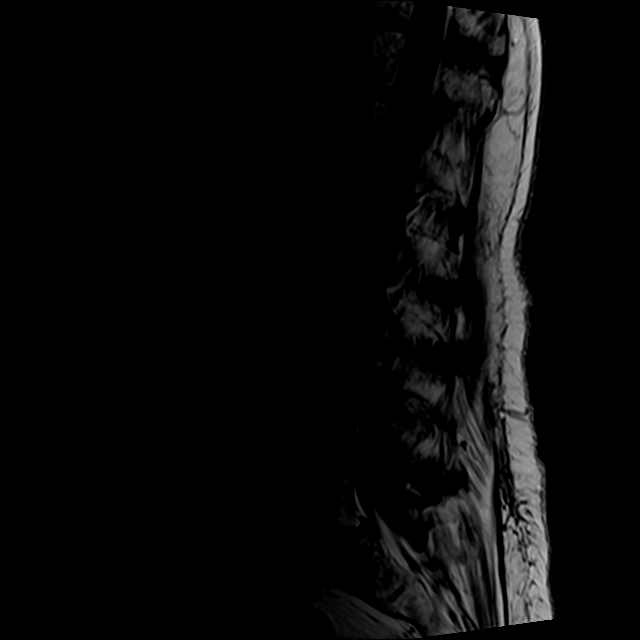
[im 12/15]
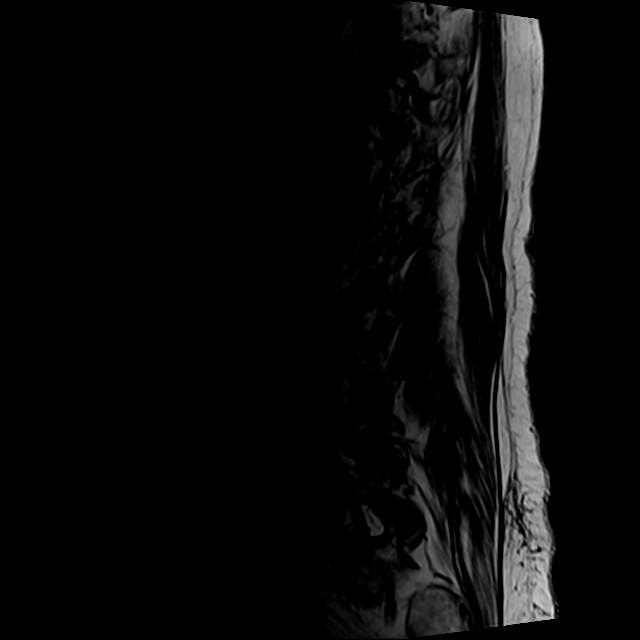
[im 15/15]
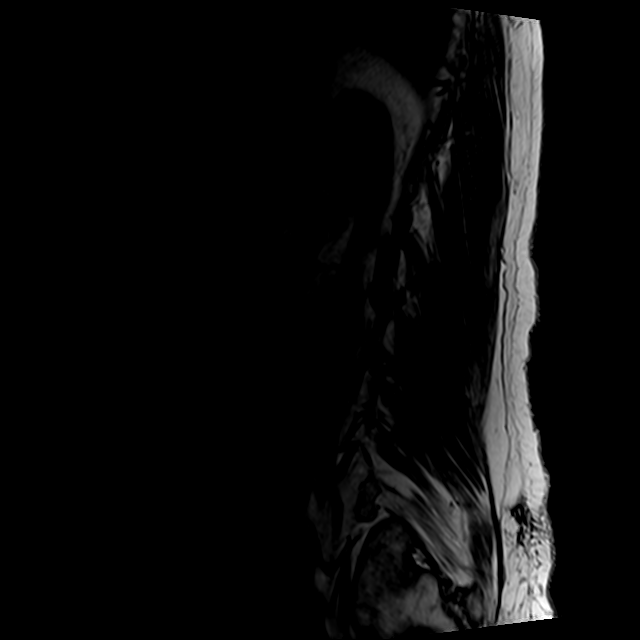

[Series 6: T2 · axial · 4.0mm · 0.78mm/px · z∈[-55,+148]mm · 9 of 38 slices shown (2 of 2)]
[im 1/38]
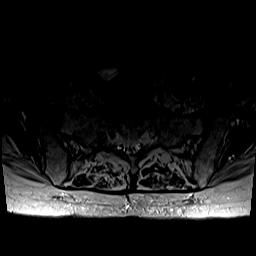
[im 6/38]
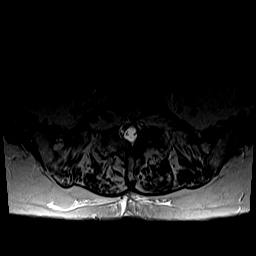
[im 11/38]
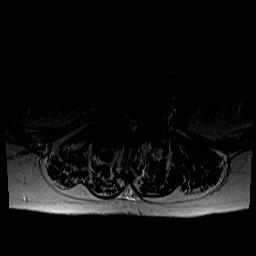
[im 16/38]
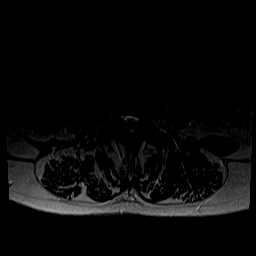
[im 19/38]
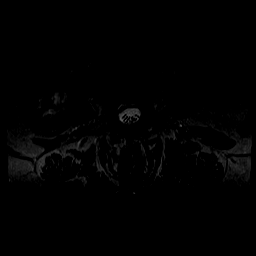
[im 22/38]
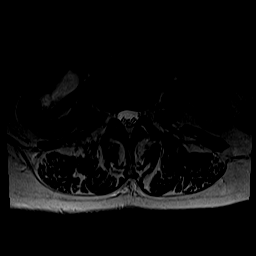
[im 27/38]
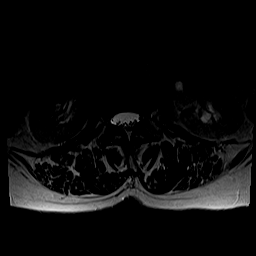
[im 32/38]
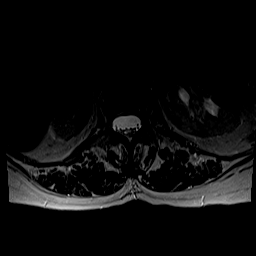
[im 38/38]
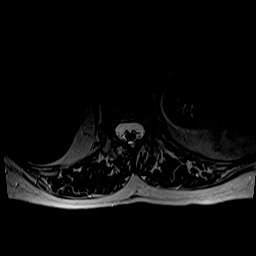

[Series 7: T1 · axial · 4.0mm · 0.39mm/px · z∈[-55,+118]mm · 4 of 38 slices shown (2 of 2)]
[im 1/38]
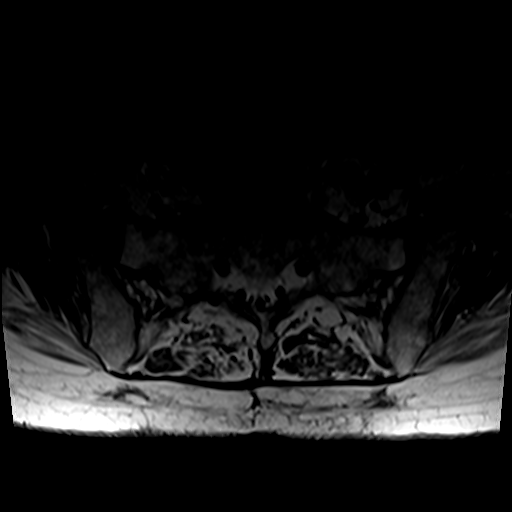
[im 6/38]
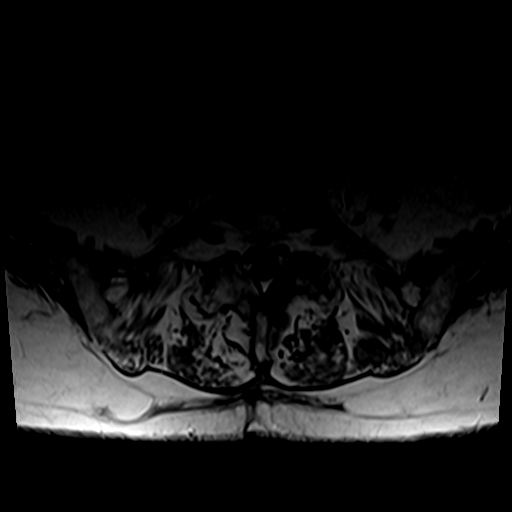
[im 19/38]
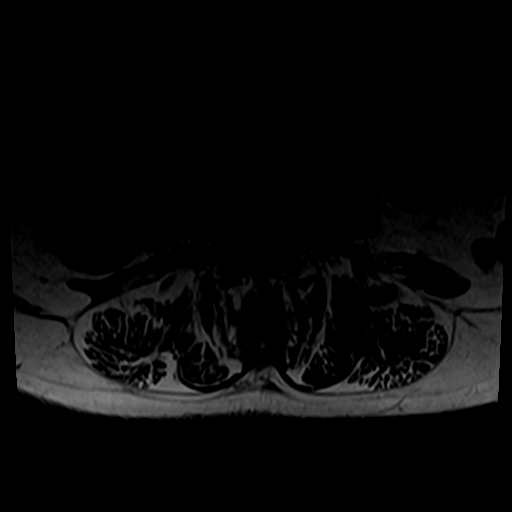
[im 32/38]
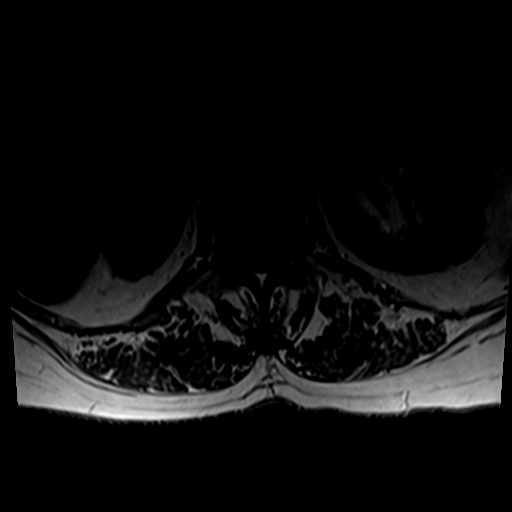

[25 of 48 positions shown; findings below may reference images not displayed]

FINDINGS: Segmentation:  Standard.

Alignment: Mild lumbar dextroscoliosis. Facet mediated
anterolisthesis of L4 on L5 and L5 on S1 measuring 7 mm and 4 mm,
respectively.

Vertebrae: L3 superior endplate compression fracture with 20%
vertebral body height loss, small fluid-filled fracture cleft, and
mild marrow edema. L4 superior endplate compression fracture with
35% height loss and no edema. Preserved vertebral body heights at
L1, L2, and L5. No suspicious marrow lesion.

Conus medullaris and cauda equina: Conus extends to the T12-L1
level. Conus and cauda equina appear normal.

Paraspinal and other soft tissues: Bilateral renal sinus cysts and
possibly mild chronic bilateral hydronephrosis.

Disc levels:

Disc desiccation throughout the lumbar spine. Disc space narrowing
is mild at L1-2 and L3-4, moderate at L4-5, and severe at L5-S1.

T12-L1: Negative.

L1-2: Mild left eccentric disc bulging and mild facet and ligamentum
flavum hypertrophy without stenosis.

L2-3: Disc bulging and mild facet and ligamentum flavum hypertrophy
without stenosis.

L3-4: Disc bulging and moderate to severe facet and ligamentum
flavum hypertrophy result in moderate spinal stenosis, mild right
and moderate to severe left lateral recess stenosis, and mild
bilateral neural foraminal stenosis. Potential left L4 nerve root
impingement.

L4-5: Anterolisthesis with bulging uncovered disc and severe facet
and ligamentum flavum hypertrophy result in severe spinal stenosis
and moderate right and mild left neural foraminal stenosis.

L5-S1: Prior right laminectomy. Anterolisthesis with mild bulging of
uncovered disc, endplate spurring, and moderate facet hypertrophy
result in mild right lateral recess stenosis without spinal or
neural foraminal stenosis.
IMPRESSION: 1. L3 compression fracture with 20% height loss and mild marrow
edema, possibly subacute.
2. Chronic L4 compression fracture.
3. Lumbar disc and facet degeneration with severe spinal stenosis at
L4-5 and moderate spinal stenosis at L3-4.

## 2021-09-13 ENCOUNTER — Other Ambulatory Visit: Payer: Self-pay | Admitting: Family Medicine

## 2021-09-13 ENCOUNTER — Telehealth: Payer: Self-pay | Admitting: Family Medicine

## 2021-09-13 DIAGNOSIS — S32030A Wedge compression fracture of third lumbar vertebra, initial encounter for closed fracture: Secondary | ICD-10-CM

## 2021-09-13 NOTE — Progress Notes (Signed)
Lumbar spine MRI does show a compression fracture at L3 that looks subacute meaning potentially treatable with kyphoplasty or vertebroplasty.  There is a chronic compression fracture at L4 which is old and not treatable but also probably not causing pain.  Additionally you have severe spinal stenosis at L4-5 and moderate spinal stenosis at L3-4.  I have referred you to interventional radiology to evaluate for kyphoplasty or vertebroplasty. Please call interventional radiology agrees for imaging to schedule.  Phone number is [phone].

## 2021-09-13 NOTE — Telephone Encounter (Signed)
Ordered referral to interventional radiology Our office should call [facility] at [phone] to inform [facility] imaging

## 2021-09-14 DIAGNOSIS — M810 Age-related osteoporosis without current pathological fracture: Secondary | ICD-10-CM | POA: Diagnosis not present

## 2021-09-14 DIAGNOSIS — S32040A Wedge compression fracture of fourth lumbar vertebra, initial encounter for closed fracture: Secondary | ICD-10-CM | POA: Diagnosis not present

## 2021-09-14 DIAGNOSIS — I10 Essential (primary) hypertension: Secondary | ICD-10-CM | POA: Diagnosis not present

## 2021-09-14 DIAGNOSIS — M8000XA Age-related osteoporosis with current pathological fracture, unspecified site, initial encounter for fracture: Secondary | ICD-10-CM | POA: Diagnosis not present

## 2021-09-15 ENCOUNTER — Ambulatory Visit
Admission: RE | Admit: 2021-09-15 | Discharge: 2021-09-15 | Disposition: A | Discharge status: Home / Self Care | Payer: Medicare HMO | Source: Ambulatory Visit | Attending: Family Medicine | Admitting: Family Medicine

## 2021-09-15 DIAGNOSIS — M4846XA Fatigue fracture of vertebra, lumbar region, initial encounter for fracture: Secondary | ICD-10-CM | POA: Diagnosis not present

## 2021-09-15 DIAGNOSIS — S32030A Wedge compression fracture of third lumbar vertebra, initial encounter for closed fracture: Secondary | ICD-10-CM

## 2021-09-15 NOTE — Consult Note (Signed)
Chief Complaint: Patient was seen in consultation today for lumbar spine pain, insufficiency fracture at the request of Corey,Evan S  Referring Physician(s): Corey,Evan S  History of Present Illness: Bianca Wolf is a 81 y.o. female Referred at the kind request of Dr. Denyse Amass to evaluate for subacute symptomatic L3 compression fracture.  Bianca Wolf was in her usual state of good health and working at the [employer] in [place] when she strained her back while lifting up a box.  The next day she had progressively severe lumbar spine pain.  This happened approximately 1 month ago.  She is currently taking tramadol 50 mg every 6 hours for pain.  She reports that her back pain is severe rating a 10/10 on a 10 point scale.  With pain medication, her pain decreases slightly to an 8/10.  Her pain is particularly exacerbated by bending over.  Additionally, she reports fairly significant disability.  She scored a 19/24 on the [employer] disability questionnaire.   She denies lower extremity weakness, numbness, paresthesias or changes in bowel or bladder function.    Past Medical History:  Diagnosis Date   Hypertension     No past surgical history on file.  Allergies: Patient has no known allergies.  Medications: Prior to Admission medications   Medication Sig Start Date End Date Taking? Authorizing Provider  acetaminophen (TYLENOL) 325 MG tablet Take 2 tablets (650 mg total) by mouth every 6 (six) hours as needed. 10/11/15   Melton Krebs, PA-C  benazepril (LOTENSIN) 20 MG tablet Take 20 mg by mouth daily. 07/09/21   [provider]  Cholecalciferol (VITAMIN D-3) 5000 UNIT/ML LIQD Take 1 capsule by mouth daily.    [provider]  GEMTESA 75 MG TABS Take 75 mg by mouth daily. 04/30/21   [provider]  lisinopril (PRINIVIL,ZESTRIL) 10 MG tablet Take 10 mg by mouth daily.    [provider]  LORazepam (ATIVAN) 0.5 MG tablet 1-2 tabs 30 - 60 min  prior to MRI. Do not drive with this medicine. 09/07/21   Rodolph Bong, MD  nitrofurantoin, macrocrystal-monohydrate, (MACROBID) 100 MG capsule Take 100 mg by mouth every 12 (twelve) hours. 08/23/21   [provider]  omeprazole (PRILOSEC) 20 MG capsule Take 20 mg by mouth daily. 07/10/21   [provider]  solifenacin (VESICARE) 10 MG tablet Take 10 mg by mouth daily. 06/19/21   [provider]  traMADol (ULTRAM) 50 MG tablet Take by mouth every 6 (six) hours as needed.    [provider]     No family history on file.  Social History   Socioeconomic History   Marital status: Widowed    Spouse name: Not on file   Number of children: Not on file   Years of education: Not on file   Highest education level: Not on file  Occupational History   Not on file  Tobacco Use   Smoking status: Never   Smokeless tobacco: Never  Substance and Sexual Activity   Alcohol use: No   Drug use: No   Sexual activity: Yes    Birth control/protection: None  Other Topics Concern   Not on file  Social History Narrative   Not on file   Social Determinants of Health   Financial Resource Strain: Not on file  Food Insecurity: Not on file  Transportation Needs: Not on file  Physical Activity: Not on file  Stress: Not on file  Social Connections: Not on file  Review of Systems: A 12 point ROS discussed and pertinent positives are indicated in the HPI above.  All other systems are negative.  Review of Systems  Vital Signs: BP (!) 186/77 (BP Location: Left Arm, Patient Position: Sitting, Cuff Size: Normal)   Temp 97.7 F (36.5 C) (Oral)   Wt 72.6 kg Comment: per pt  SpO2 98% Comment: room air  BMI 27.46 kg/m   Physical Exam Constitutional:      General: She is not in acute distress.    Appearance: Normal appearance.  HENT:     Head: Normocephalic and atraumatic.  Eyes:     General: No scleral icterus. Cardiovascular:     Rate and Rhythm: Normal rate.   Pulmonary:     Effort: Pulmonary effort is normal.  Musculoskeletal:       Back:     Comments: TTP at L3 and in the right paraspinal muscles as well.   Neurological:     Mental Status: She is alert and oriented to person, place, and time.  Psychiatric:        Mood and Affect: Mood normal.        Behavior: Behavior normal.     Imaging: MR Lumbar Spine Wo Contrast  Result Date: 09/12/2021 CLINICAL DATA:  Lumbar compression fracture. Low back pain after picking up boxes 1 month ago. Bilateral leg weakness. Remote history of lumbar surgery. EXAM: MRI LUMBAR SPINE WITHOUT CONTRAST TECHNIQUE: Multiplanar, multisequence MR imaging of the lumbar spine was performed. No intravenous contrast was administered. COMPARISON:  CT abdomen and pelvis 06/29/2005 FINDINGS: Segmentation:  Standard. Alignment: Mild lumbar dextroscoliosis. Facet mediated anterolisthesis of L4 on L5 and L5 on S1 measuring 7 mm and 4 mm, respectively. Vertebrae: L3 superior endplate compression fracture with 20% vertebral body height loss, small fluid-filled fracture cleft, and mild marrow edema. L4 superior endplate compression fracture with 35% height loss and no edema. Preserved vertebral body heights at L1, L2, and L5. No suspicious marrow lesion. Conus medullaris and cauda equina: Conus extends to the T12-L1 level. Conus and cauda equina appear normal. Paraspinal and other soft tissues: Bilateral renal sinus cysts and possibly mild chronic bilateral hydronephrosis. Disc levels: Disc desiccation throughout the lumbar spine. Disc space narrowing is mild at L1-2 and L3-4, moderate at L4-5, and severe at L5-S1. T12-L1: Negative. L1-2: Mild left eccentric disc bulging and mild facet and ligamentum flavum hypertrophy without stenosis. L2-3: Disc bulging and mild facet and ligamentum flavum hypertrophy without stenosis. L3-4: Disc bulging and moderate to severe facet and ligamentum flavum hypertrophy result in moderate spinal stenosis,  mild right and moderate to severe left lateral recess stenosis, and mild bilateral neural foraminal stenosis. Potential left L4 nerve root impingement. L4-5: Anterolisthesis with bulging uncovered disc and severe facet and ligamentum flavum hypertrophy result in severe spinal stenosis and moderate right and mild left neural foraminal stenosis. L5-S1: Prior right laminectomy. Anterolisthesis with mild bulging of uncovered disc, endplate spurring, and moderate facet hypertrophy result in mild right lateral recess stenosis without spinal or neural foraminal stenosis. IMPRESSION: 1. L3 compression fracture with 20% height loss and mild marrow edema, possibly subacute. 2. Chronic L4 compression fracture. 3. 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 Electronically Signed   By: Logan Bores M.D.   On: 09/12/2021 15:48    Labs:  CBC: No results for input(s): WBC, HGB, HCT, PLT in the last 8760 hours.  COAGS: No results for input(s): INR,  APTT in the last 8760 hours.  BMP: No results for input(s): NA, K, CL, CO2, GLUCOSE, BUN, CALCIUM, CREATININE, GFRNONAA, GFRAA in the last 8760 hours.  Invalid input(s): CMP  LIVER FUNCTION TESTS: No results for input(s): BILITOT, AST, ALT, ALKPHOS, PROT, ALBUMIN in the last 8760 hours.  TUMOR MARKERS: No results for input(s): AFPTM, CEA, CA199, CHROMGRNA in the last 8760 hours.  Assessment and Plan:    Very pleasant 81 year old female with a spontaneous lumbar insufficiency fracture at L3 resulting in 20% height loss with persistent marrow edema on MRI.  This results in severe 10/10 pain exacerbated by bending and secondary disability (19/24 on the [employer] disability questionnaire).  She is an excellent candidate for percutaneous cement augmentation with balloon kyphoplasty.  Additionally, due to her overall good health she would be a good candidate for outpatient KP.    1.)  plan to schedule  for L3 cement augmentation with balloon kyphoplasty.  She would like to proceed as soon as possible.    Thank you for this interesting consult.  I greatly enjoyed meeting Bianca Wolf and look forward to participating in their care.  A copy of this report was sent to the requesting provider on this date.  Electronically Signed: Criselda Peaches 09/15/2021, 12:05 PM   I spent a total of  40 Minutes  in face to face in clinical consultation, greater than 50% of which was counseling/coordinating care for L3 compression fracture.

## 2021-09-19 ENCOUNTER — Other Ambulatory Visit: Payer: Self-pay | Admitting: *Deleted

## 2021-09-19 ENCOUNTER — Other Ambulatory Visit: Payer: Self-pay | Admitting: Family Medicine

## 2021-09-19 DIAGNOSIS — S32030A Wedge compression fracture of third lumbar vertebra, initial encounter for closed fracture: Secondary | ICD-10-CM

## 2021-09-19 DIAGNOSIS — M4716 Other spondylosis with myelopathy, lumbar region: Secondary | ICD-10-CM | POA: Diagnosis not present

## 2021-09-19 LAB — CBC
HCT: 36.2 % (ref 35.0–45.0)
Hemoglobin: 11.9 g/dL (ref 11.7–15.5)
MCH: 29.8 pg (ref 27.0–33.0)
MCHC: 32.9 g/dL (ref 32.0–36.0)
MCV: 90.7 fL (ref 80.0–100.0)
MPV: 10.4 fL (ref 7.5–12.5)
Platelets: 267 10*3/uL (ref 140–400)
RBC: 3.99 10*6/uL (ref 3.80–5.10)
RDW: 12.7 % (ref 11.0–15.0)
WBC: 7.4 10*3/uL (ref 3.8–10.8)

## 2021-09-21 ENCOUNTER — Ambulatory Visit
Admission: RE | Admit: 2021-09-21 | Discharge: 2021-09-21 | Disposition: A | Discharge status: Home / Self Care | Payer: Medicare HMO | Source: Ambulatory Visit | Attending: Family Medicine | Admitting: Family Medicine

## 2021-09-21 DIAGNOSIS — M8008XA Age-related osteoporosis with current pathological fracture, vertebra(e), initial encounter for fracture: Secondary | ICD-10-CM | POA: Diagnosis not present

## 2021-09-21 DIAGNOSIS — S32030A Wedge compression fracture of third lumbar vertebra, initial encounter for closed fracture: Secondary | ICD-10-CM

## 2021-09-21 HISTORY — PX: IR KYPHO LUMBAR INC FX REDUCE BONE BX UNI/BIL CANNULATION INC/IMAGING: IMG5519

## 2021-09-21 IMAGING — XA IR KYPHO VERTEBRAL LUMBAR AUGMENTATION
11 of 15 series · 12 of 16 positions shown · non-contrast
Comparison: MRI lumbar spine [DATE]

CLINICAL DATA: 80-year-old female with a symptomatic L3 compression
fracture. She presents today for cement augmentation with balloon
kyphoplasty.

EXAM:
FLUOROSCOPIC GUIDED KYPHOPLASTY OF THE L3 VERTEBRAL BODY

[Series 1: standard · 1 of 1 slices shown (1 of 11)]
[im 1/1]
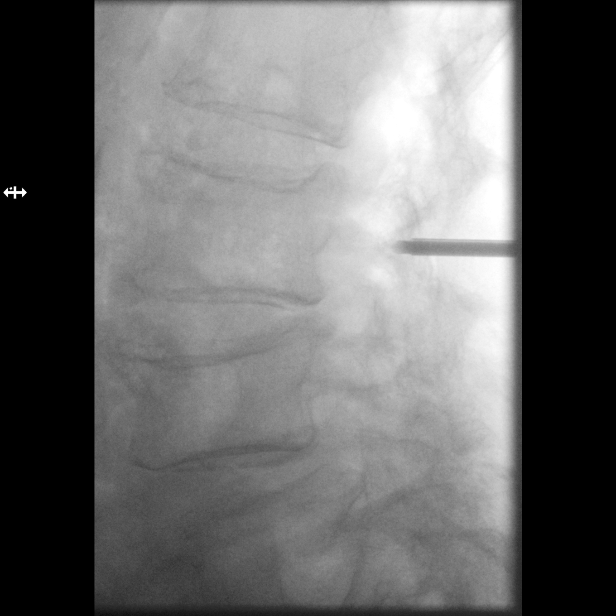

[Series 3: standard · 1 of 1 slices shown (2 of 11)]
[im 1/1]
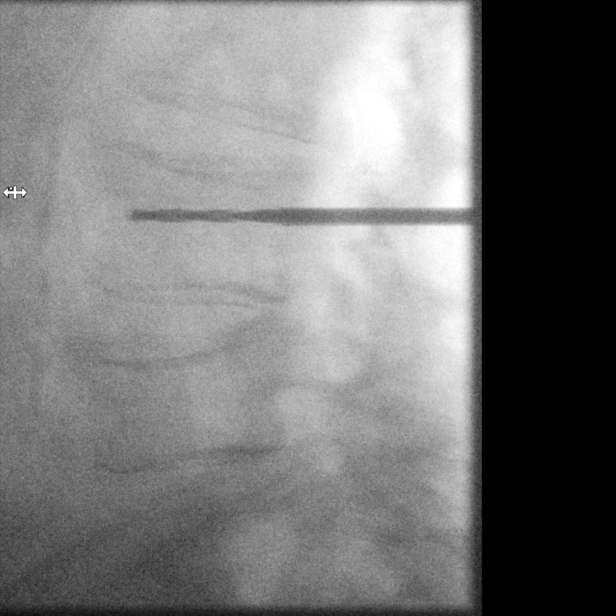

[Series 4: standard · 1 of 1 slices shown (3 of 11)]
[im 1/1]
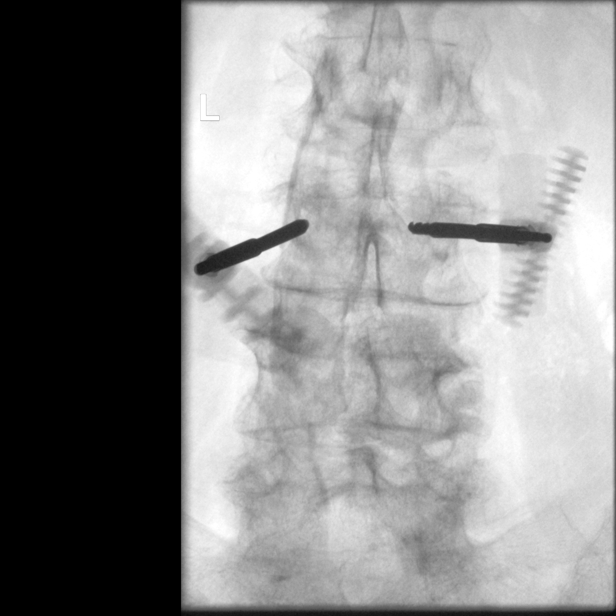

[Series 5: standard · 1 of 1 slices shown (4 of 11)]
[im 1/1]
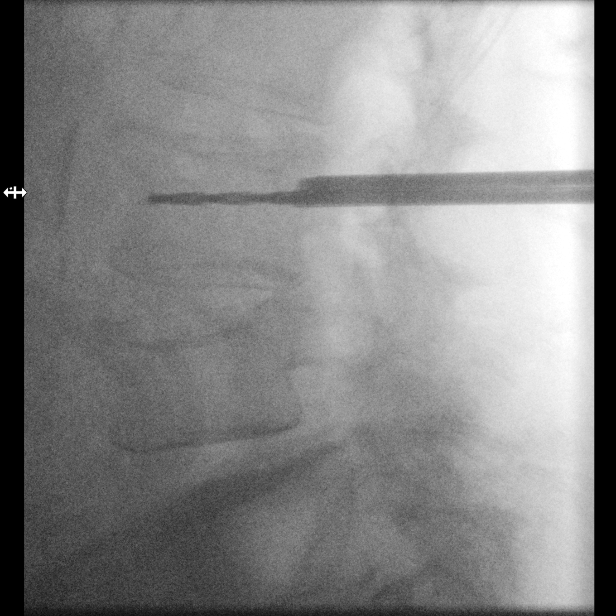

[Series 7: standard · 1 of 1 slices shown (5 of 11)]
[im 1/1]
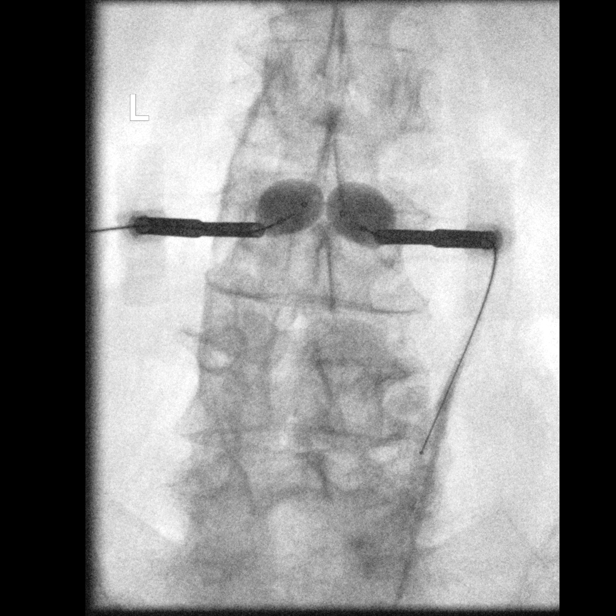

[Series 8: standard · 2 of 2 slices shown (6 of 11)]
[im 1/2]
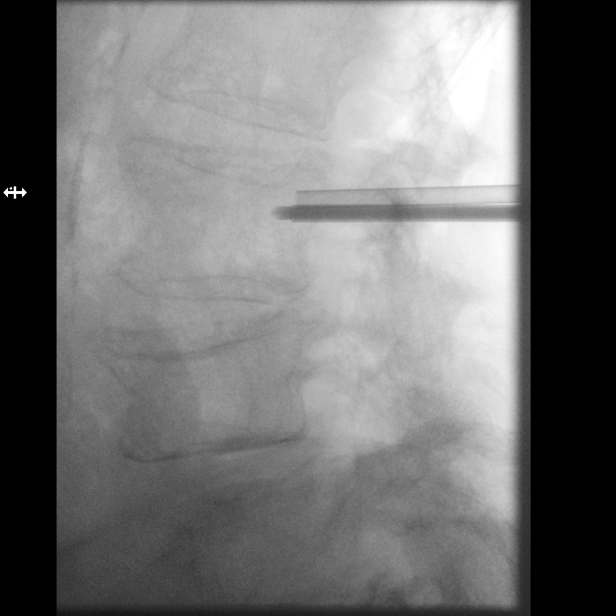
[im 2/2]
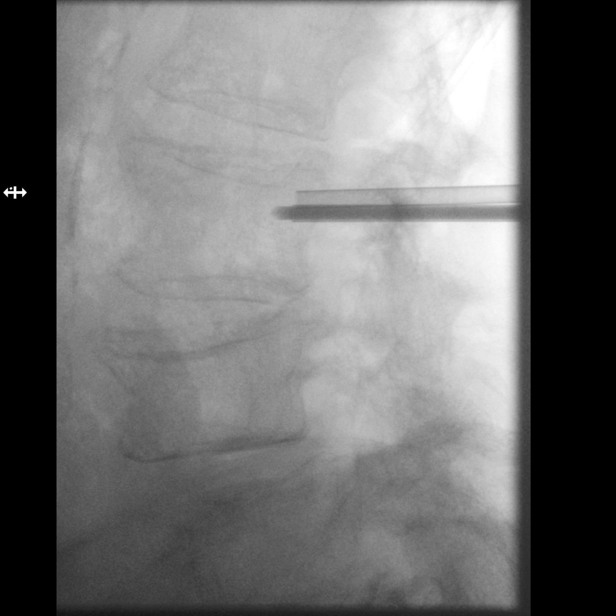

[Series 11: standard · 1 of 1 slices shown (7 of 11)]
[im 1/1]
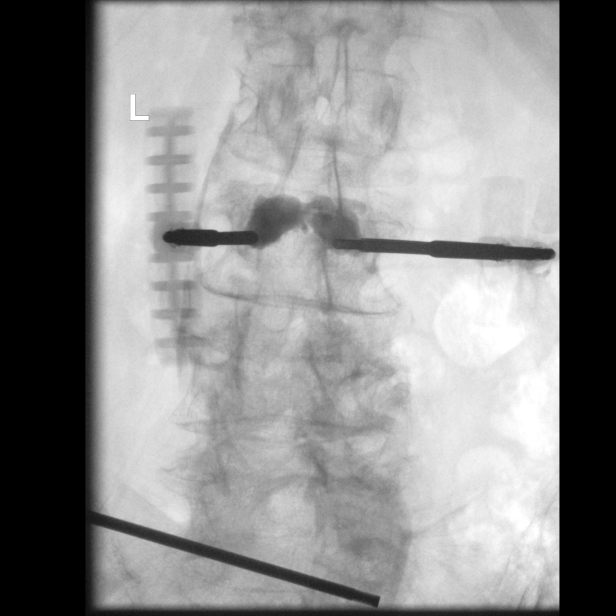

[Series 12: standard · 1 of 1 slices shown (8 of 11)]
[im 1/1]
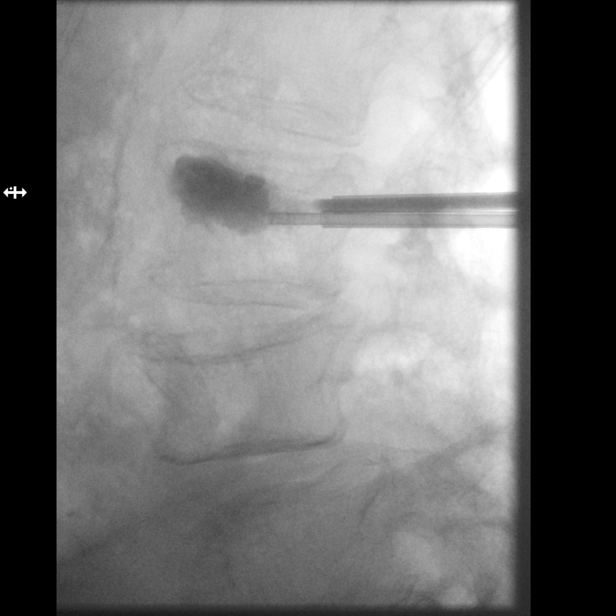

[Series 13: standard · 1 of 1 slices shown (9 of 11)]
[im 1/1]
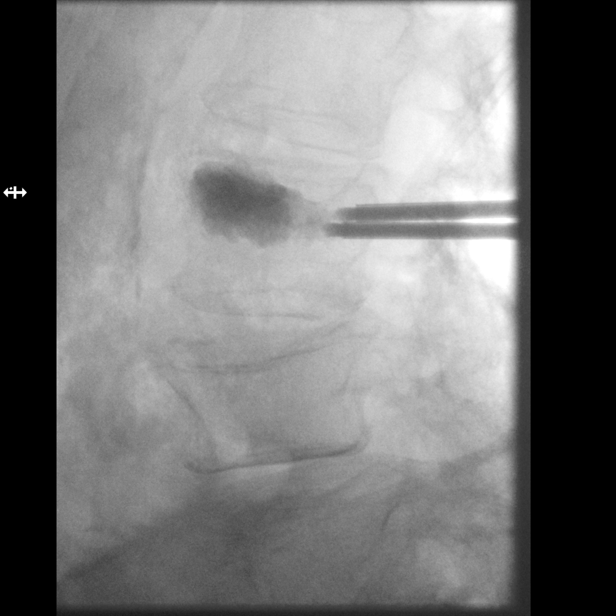

[Series 15: standard · 1 of 1 slices shown (10 of 11)]
[im 1/1]
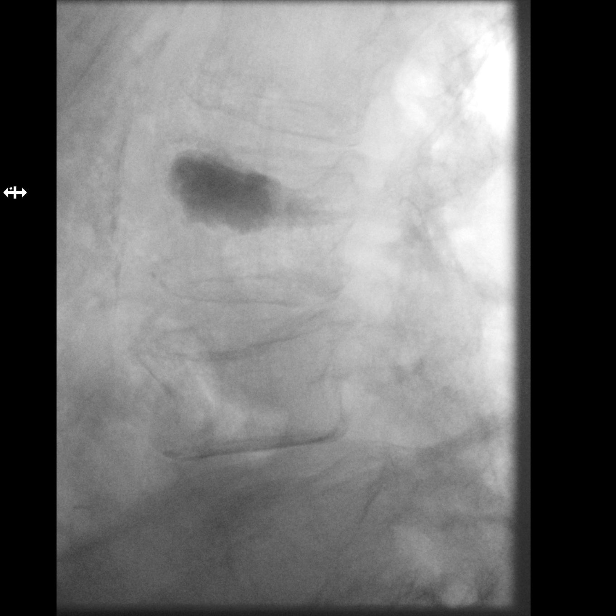

[Series 16: standard · 1 of 1 slices shown (11 of 11)]
[im 1/1]
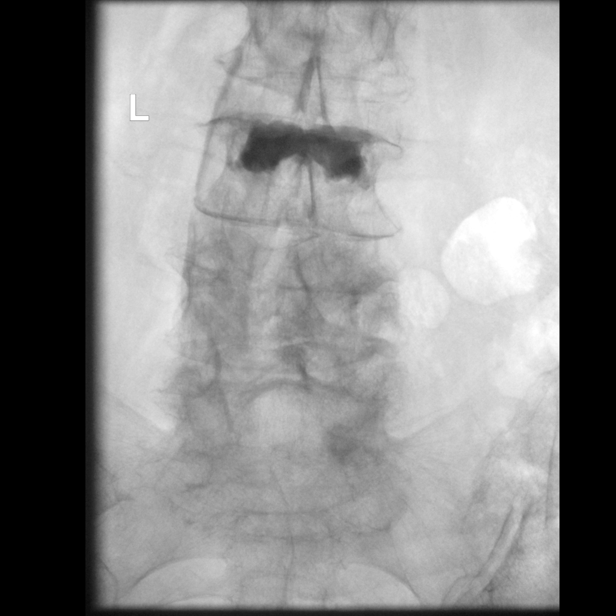

[12 of 16 positions shown; findings below may reference images not displayed]

MEDICATIONS:
As antibiotic prophylaxis, 2 g Ancef was ordered pre-procedure and
administered intravenously within 1 hour of incision.

Postprocedure, [4G] mg Tylenol was administered intravenously.

ANESTHESIA/SEDATION:
Moderate (conscious) sedation was employed during this procedure. A
total of Versed 3.5 mg and Fentanyl 125 mcg was administered
intravenously.

Moderate Sedation Time: 39 minutes. The patient's level of
consciousness and vital signs were monitored continuously by
radiology nursing throughout the procedure under my direct
supervision.

FLUOROSCOPY TIME:  Radiation exposure index: 92.3 mGy reference air
kerma

COMPLICATIONS:
None immediate.

PROCEDURE:
The procedure, risks (including but not limited to bleeding,
infection, organ damage), benefits, and alternatives were explained
to the patient. Questions regarding the procedure were encouraged
and answered. The patient understands and consents to the procedure.

The patient has suffered a fracture of the L3. It is recommended
that patients aged 50 years or older be evaluated for possible
testing or treatment of osteoporosis. A copy of this procedure
report is sent to the patient's referring physician

The patient was placed prone on the fluoroscopic table. The skin
overlying the lumbar region was then prepped and draped in the usual
sterile fashion. Maximal barrier sterile technique was utilized
including caps, mask, sterile gowns, sterile gloves, sterile drape,
hand hygiene and skin antiseptic. Intravenous Fentanyl and Versed
were administered as conscious sedation during continuous
cardiorespiratory monitoring by the radiology RN.

The right pedicle at L3 was then infiltrated with 1% lidocaine
followed by the advancement of a Kyphon trocar needle through the
left pedicle into the posterior one-third of the vertebral body.
Subsequently, the osteo drill was advanced to the anterior third of
the vertebral body. The osteo drill was retracted. Through the
working cannula, a Kyphon inflatable bone tamp 15 x 2.5 was advanced
and positioned with the distal marker approximately 5 mm from the
anterior aspect of the cortex. Appropriate positioning was confirmed
on the AP projection. At this time, the balloon was expanded using
contrast via a Kyphon inflation syringe device via micro tubing.

In similar fashion, the left L3 pedicle was infiltrated with 1%
lidocaine followed by the advancement of a second Kyphon trocar
needle through the right pedicle into the posterior third of the
vertebral body. Subsequently, the osteo drill was coaxially advanced
to the anterior right third. The osteo drill was exchanged for a
Kyphon inflatable bone tamp 15 x 2.5, advanced to the 5 mm of the
anterior aspect of the cortex. The balloon was then expanded using
contrast as above.

Inflations were continued until there was near apposition with the
superior end plate. At this time, methylmethacrylate mixture was
reconstituted in the Kyphon bone mixing device system. This was then
loaded into the delivery mechanism, attached to Kyphon bone fillers.

The balloons were deflated and removed followed by the instillation
of methylmethacrylate mixture with excellent filling in the AP and
lateral projections. No extravasation was noted in the disk spaces
or posteriorly into the spinal canal. No epidural venous
contamination was seen.

The working cannulae and the bone filler were then retrieved and
removed. Hemostasis was achieved with manual compression. The
patient tolerated the procedure well without immediate
postprocedural complication.
IMPRESSION: 1. Technically successful L3 vertebral body augmentation using
balloon kyphoplasty.
2. Per CMS PQRS reporting requirements (PQRS Measure 24): Given the
patient's age of greater than 50 and the fracture site (hip, distal
radius, or spine), the patient should be tested for osteoporosis
using DXA, and the appropriate treatment considered based on the DXA
results.

## 2021-09-21 MED ORDER — IOPAMIDOL (ISOVUE-M 200) INJECTION 41%
20.0000 mL | Freq: Once | INTRAMUSCULAR | Status: AC
  Administered 2021-09-21: 20 mL

## 2021-09-21 MED ORDER — FENTANYL CITRATE PF 50 MCG/ML IJ SOSY
25.0000 ug | PREFILLED_SYRINGE | INTRAMUSCULAR | Status: DC | PRN
  Administered 2021-09-21 (×2): 25 ug via INTRAVENOUS
  Administered 2021-09-21: 50 ug via INTRAVENOUS
  Administered 2021-09-21: 25 ug via INTRAVENOUS

## 2021-09-21 MED ORDER — SODIUM CHLORIDE 0.9 % IV SOLN: INTRAVENOUS | Status: DC

## 2021-09-21 MED ORDER — CEFAZOLIN SODIUM-DEXTROSE 2-4 GM/100ML-% IV SOLN
2.0000 g | INTRAVENOUS | Status: AC
  Administered 2021-09-21: 2 g via INTRAVENOUS

## 2021-09-21 MED ORDER — MIDAZOLAM HCL 2 MG/2ML IJ SOLN
1.0000 mg | INTRAMUSCULAR | Status: DC | PRN
  Administered 2021-09-21: 1 mg via INTRAVENOUS
  Administered 2021-09-21 (×2): 0.5 mg via INTRAVENOUS
  Administered 2021-09-21: 1 mg via INTRAVENOUS

## 2021-09-21 MED ORDER — ACETAMINOPHEN 10 MG/ML IV SOLN
1000.0000 mg | Freq: Once | INTRAVENOUS | Status: AC
  Administered 2021-09-21: 1000 mg via INTRAVENOUS

## 2021-09-21 NOTE — Discharge Instructions (Signed)
Kyphoplasty Post Procedure Discharge Instructions  May resume a regular diet and any medications that you routinely take (including pain medications). However, if you are taking Aspirin or an anticoagulant/blood thinner you will be told when you can resume taking these by the healthcare provider. No driving day of procedure. The day of your procedure take it easy. You may use an ice pack as needed to injection sites on back.  Ice to back 30 minutes on and 30 minutes off, as needed. May remove bandaids tomorrow after taking a shower. Replace daily with a clean bandaid until healed.  Do not lift anything heavier than a milk jug for 1-2 weeks or determined by your physician.  Follow up with your physician in 2 weeks.    Please contact our office at [phone] for the following symptoms or if you have any questions:  Fever greater than 100 degrees Increased swelling, pain, or redness at injection site. Increased back and/or leg pain New numbness or change in symptoms from before the procedure.    Thank you for visiting [facility].

## 2021-09-21 NOTE — Progress Notes (Signed)
Pt back in nursing recovery area. Pt still drowsy from procedure but will wake up when spoken to. Pt follows commands, talks in complete sentences and has no complaints at this time. Pt will remain in nursing station until discharge.  ?

## 2021-09-22 ENCOUNTER — Encounter: Payer: Self-pay | Admitting: Family Medicine

## 2021-09-26 ENCOUNTER — Other Ambulatory Visit (HOSPITAL_COMMUNITY): Payer: Self-pay | Admitting: Radiology

## 2021-09-26 ENCOUNTER — Other Ambulatory Visit: Payer: Self-pay | Admitting: Interventional Radiology

## 2021-09-26 DIAGNOSIS — M545 Low back pain, unspecified: Secondary | ICD-10-CM

## 2021-09-26 DIAGNOSIS — G8918 Other acute postprocedural pain: Secondary | ICD-10-CM

## 2021-09-26 MED ORDER — OXYCODONE-ACETAMINOPHEN 2.5-325 MG PO TABS: 1.0000 | ORAL_TABLET | Freq: Four times a day (QID) | ORAL | 0 refills | Status: DC | PRN

## 2021-09-28 ENCOUNTER — Ambulatory Visit (HOSPITAL_COMMUNITY)
Admission: RE | Admit: 2021-09-28 | Discharge: 2021-09-28 | Disposition: A | Discharge status: Home / Self Care | Payer: Medicare HMO | Source: Ambulatory Visit | Attending: Interventional Radiology | Admitting: Interventional Radiology

## 2021-09-28 DIAGNOSIS — G8918 Other acute postprocedural pain: Secondary | ICD-10-CM | POA: Diagnosis not present

## 2021-09-28 DIAGNOSIS — M4316 Spondylolisthesis, lumbar region: Secondary | ICD-10-CM | POA: Diagnosis not present

## 2021-09-28 DIAGNOSIS — M545 Low back pain, unspecified: Secondary | ICD-10-CM | POA: Diagnosis not present

## 2021-09-28 IMAGING — MR MR LUMBAR SPINE W/O CM
4 of 5 series · 30 of 48 positions shown · non-contrast
Comparison: MRI of the lumbar spine [DATE]

CLINICAL DATA: L3 vertebral plasty.

EXAM:
MRI LUMBAR SPINE WITHOUT CONTRAST
TECHNIQUE: Multiplanar, multisequence MR imaging of the lumbar spine was
performed. No intravenous contrast was administered.

[Series 5: T1 · sagittal · 4.0mm · 0.81mm/px · 6 of 16 slices shown (1 of 2)]
[im 1/16]
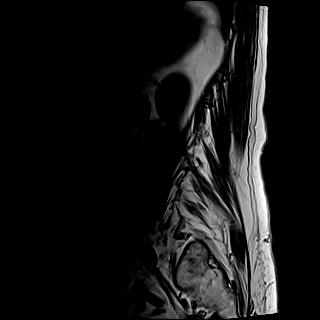
[im 4/16]
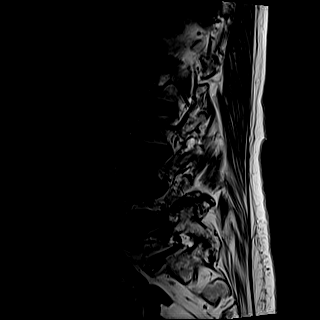
[im 7/16]
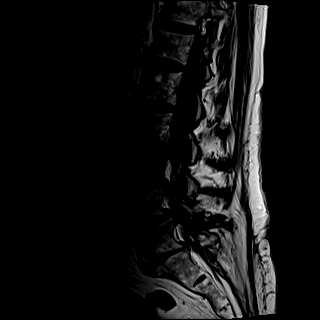
[im 10/16]
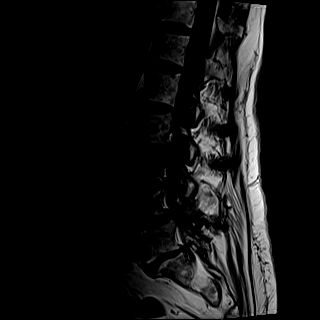
[im 13/16]
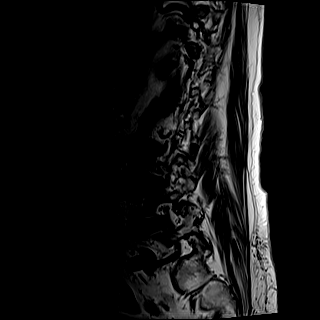
[im 16/16]
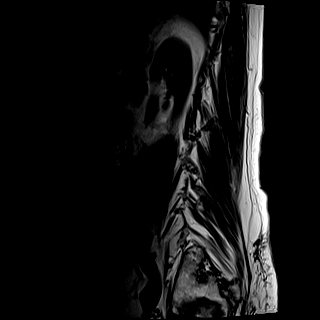

[Series 6: T2 · sagittal · 4.0mm · 0.81mm/px · 6 of 16 slices shown (1 of 2)]
[im 1/16]
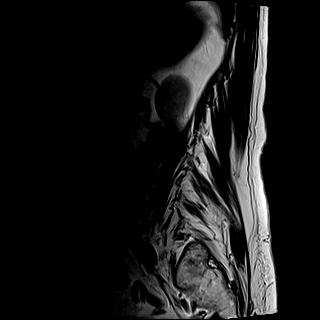
[im 4/16]
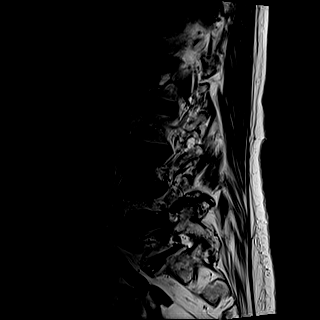
[im 7/16]
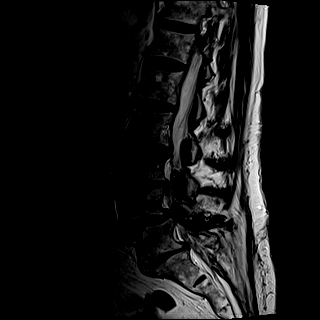
[im 10/16]
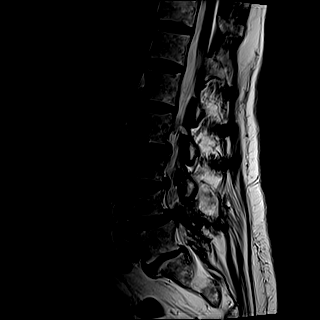
[im 13/16]
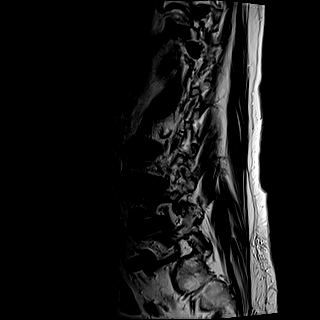
[im 16/16]
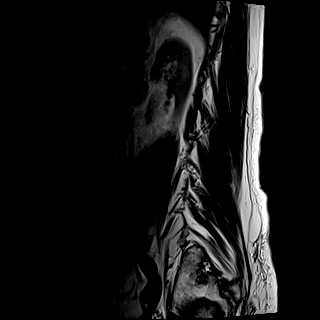

[Series 8: T2 · axial · 4.0mm · 0.62mm/px · z∈[-55,+161]mm · 9 of 38 slices shown (2 of 2)]
[im 1/38]
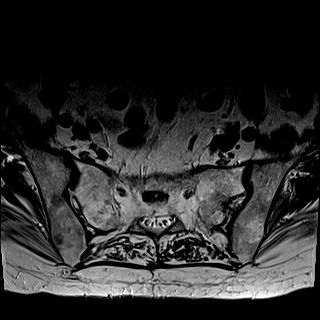
[im 6/38]
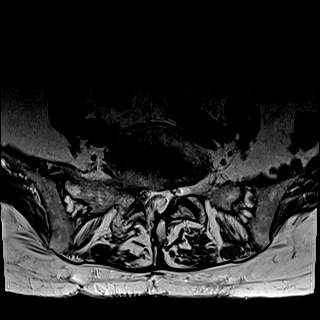
[im 11/38]
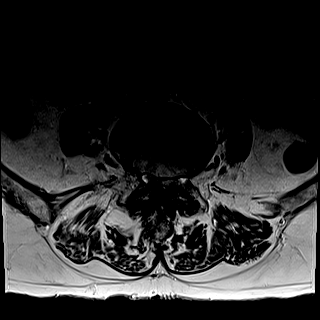
[im 16/38]
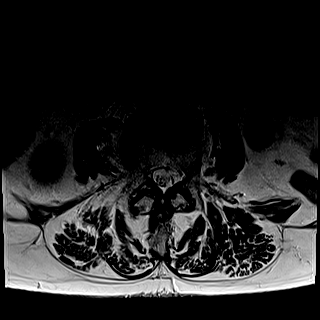
[im 19/38]
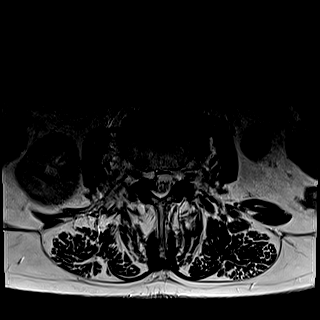
[im 22/38]
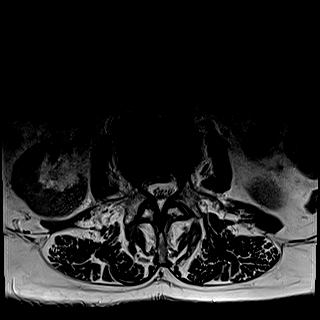
[im 27/38]
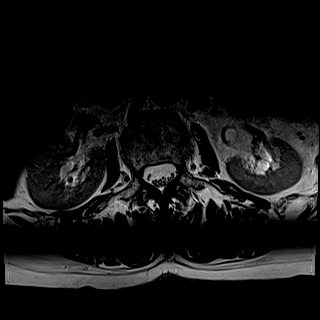
[im 32/38]
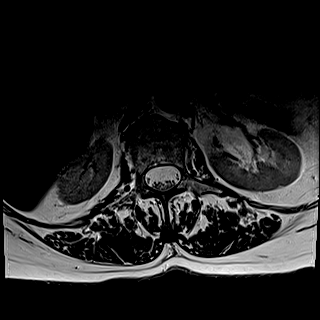
[im 38/38]
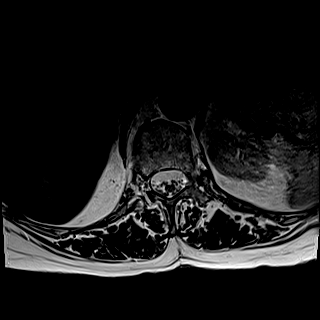

[Series 9: T1 · axial · 4.0mm · 0.39mm/px · z∈[-55,+161]mm · 9 of 38 slices shown (2 of 2)]
[im 1/38]
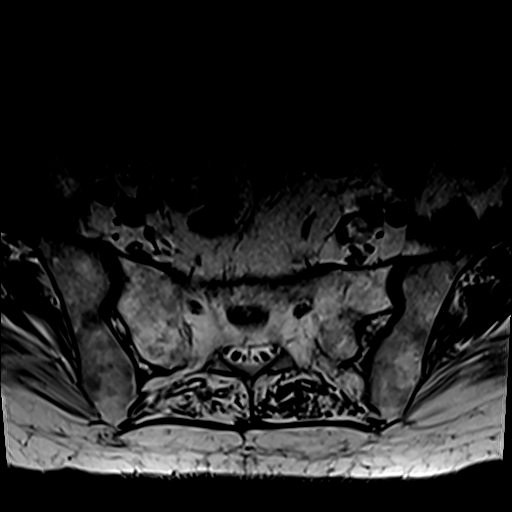
[im 6/38]
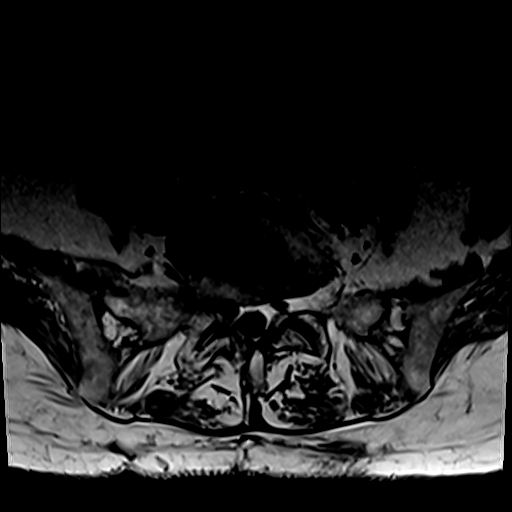
[im 11/38]
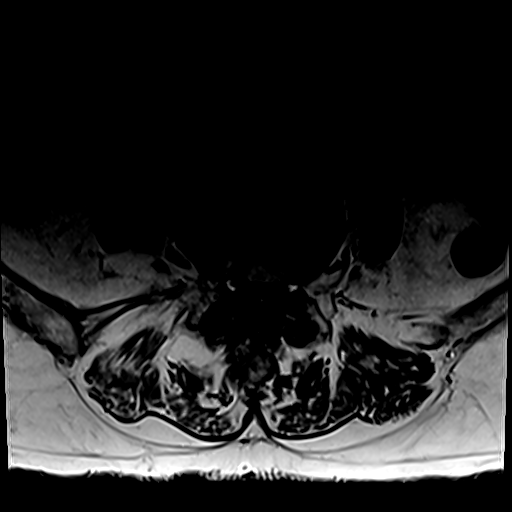
[im 16/38]
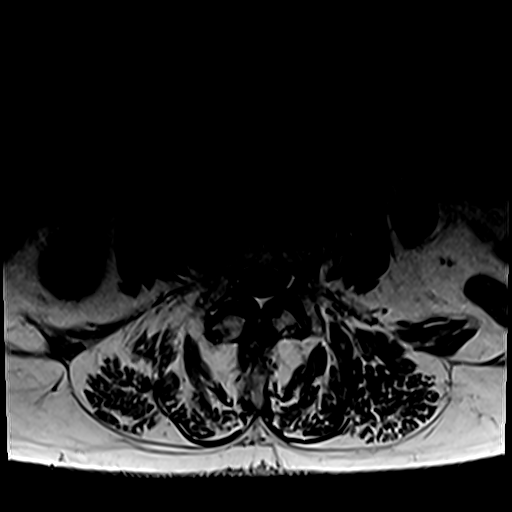
[im 19/38]
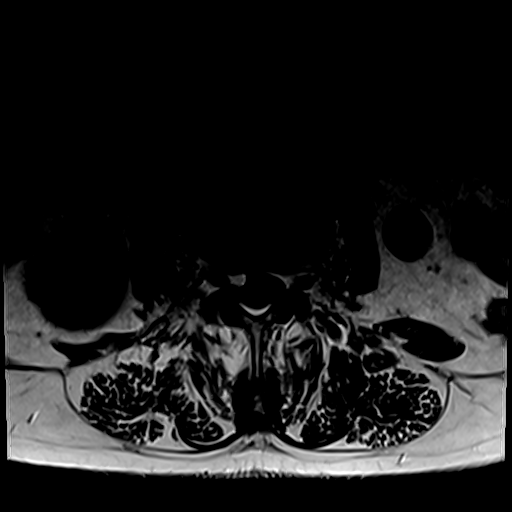
[im 22/38]
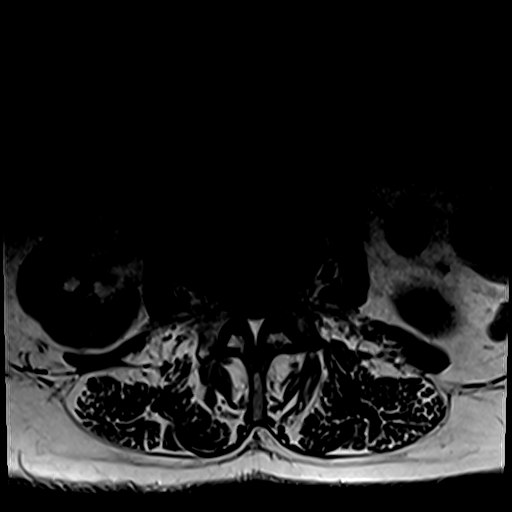
[im 27/38]
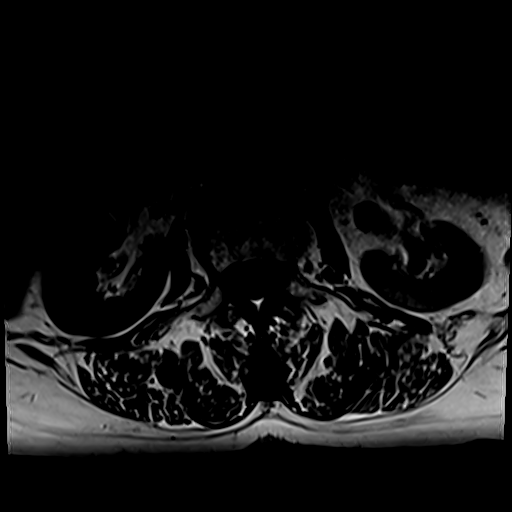
[im 32/38]
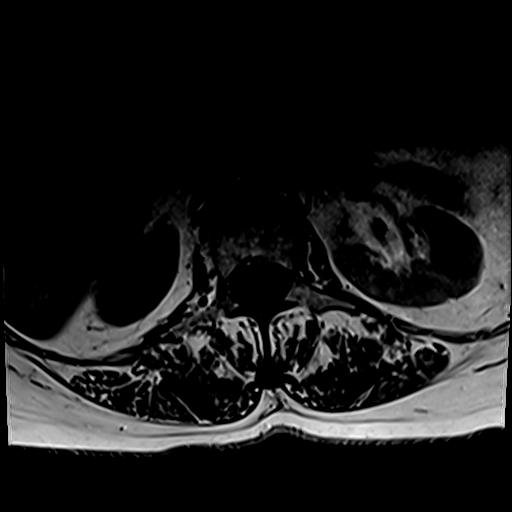
[im 38/38]
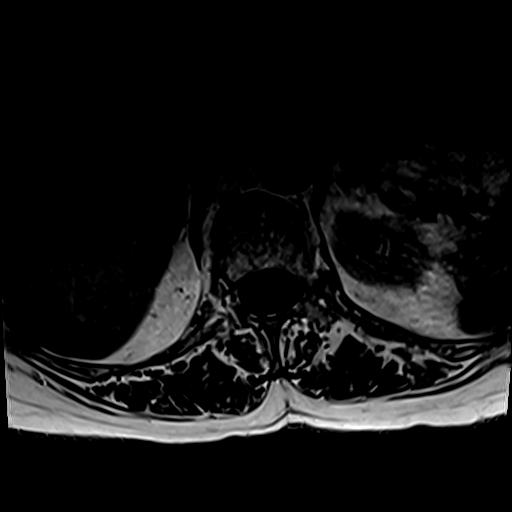

[30 of 48 positions shown; findings below may reference images not displayed]

FINDINGS: Segmentation: Standard segmentation is assumed. The inferior-most
fully formed intervertebral disc is maintained.

Alignment:  Grade 1 anterolisthesis of L4 on L5, similar.

Vertebrae: Status post L3 kyphoplasty. Progressive edema within the
vertebral body and bilateral pedicles with possible slight interval
increase in height loss (approximately 20-30%). Mild edema along the
inferior L2 endplate, probably degenerative/discogenic in etiology.
Chronic L4 compression fracture, similar.

Conus medullaris and cauda equina: Conus extends to the T12-L1
level. Conus appears normal.

Paraspinal and other soft tissues: Unremarkable.

Disc levels:

T12-L1: No significant disc protrusion, foraminal stenosis, or canal
stenosis.

L1-L2: No significant disc protrusion, foraminal stenosis, or canal
stenosis.

L2-L3: Broad disc bulging with mild right subarticular recess
stenosis. No significant canal foraminal stenosis.

L3-L4: Broad disc bulge. Bilateral facet arthropathy. Resulting
moderate canal stenosis and mild right and moderate left foraminal
stenosis, similar.

L4-L5: Similar grade 1 anterolisthesis. Uncovering the disc with
superimposed disc bulging. Ligamentum flavum thickening and severe
bilateral facet arthropathy. Resulting severe canal stenosis,
similar. Moderate right and mild left foraminal stenosis, similar.

L5-S1: Prior right laminectomy. Disc bulge and endplate spurring.
Facet arthropathy. Mild right subarticular recess stenosis, similar.
Patent canal and foramina.
IMPRESSION: 1. Status post L3 kyphoplasty. Progressive edema in the vertebral
body and bilateral pedicles with possible slight interval increase
in height loss (approximately 20-30%).
2. Similar multilevel degenerative change, including severe canal
stenosis at L4-L5 and moderate canal stenosis at L3-L4.

## 2021-09-29 ENCOUNTER — Inpatient Hospital Stay: Admission: RE | Admit: 2021-09-29 | Payer: Medicare HMO | Source: Ambulatory Visit

## 2021-09-29 ENCOUNTER — Other Ambulatory Visit: Payer: Self-pay | Admitting: Family Medicine

## 2021-09-29 ENCOUNTER — Telehealth: Payer: Self-pay

## 2021-09-29 DIAGNOSIS — M545 Low back pain, unspecified: Secondary | ICD-10-CM

## 2021-09-29 NOTE — Telephone Encounter (Signed)
Phone call to pt to follow up from her kyphoplasty on 09/21/21. Pt reports she is still having a lot of pain after speaking with the doctor and receiving another scan she has a scheduled an epidural injection on 10/03/21.  Pt. Made aware that she did not suffer from another fracture based off her most recent scan. Pt denies any signs of infection, redness at the site, draining or fever. Pt advised to call back if anything were to change or she has further concerns before her injection on Tuesday. Pt verbalized understanding.

## 2021-10-02 NOTE — Discharge Instructions (Addendum)

## 2021-10-03 ENCOUNTER — Ambulatory Visit
Admission: RE | Admit: 2021-10-03 | Discharge: 2021-10-03 | Disposition: A | Discharge status: Home / Self Care | Payer: Medicare HMO | Source: Ambulatory Visit | Attending: Family Medicine | Admitting: Family Medicine

## 2021-10-03 DIAGNOSIS — M48061 Spinal stenosis, lumbar region without neurogenic claudication: Secondary | ICD-10-CM | POA: Diagnosis not present

## 2021-10-03 DIAGNOSIS — G8929 Other chronic pain: Secondary | ICD-10-CM

## 2021-10-03 DIAGNOSIS — M47817 Spondylosis without myelopathy or radiculopathy, lumbosacral region: Secondary | ICD-10-CM | POA: Diagnosis not present

## 2021-10-03 MED ORDER — IOPAMIDOL (ISOVUE-M 200) INJECTION 41%
1.0000 mL | Freq: Once | INTRAMUSCULAR | Status: AC
  Administered 2021-10-03: 1 mL via EPIDURAL

## 2021-10-03 MED ORDER — METHYLPREDNISOLONE ACETATE 40 MG/ML INJ SUSP (RADIOLOG
80.0000 mg | Freq: Once | INTRAMUSCULAR | Status: AC
  Administered 2021-10-03: 80 mg via EPIDURAL

## 2021-10-04 ENCOUNTER — Other Ambulatory Visit: Payer: Self-pay | Admitting: Interventional Radiology

## 2021-10-04 DIAGNOSIS — Z712 Person consulting for explanation of examination or test findings: Secondary | ICD-10-CM

## 2021-10-10 ENCOUNTER — Telehealth: Payer: Self-pay | Admitting: Family Medicine

## 2021-10-10 NOTE — Telephone Encounter (Signed)
I called the patient.  We looked over her recent history.  She is scheduled with me on Friday the 23rd.  It looks like he is probably getting benefit from facet injections but I want to talk to her about it first.

## 2021-10-10 NOTE — Telephone Encounter (Signed)
Patient called stating that she had an epidural on 10/03/2021 but has not had any relief of pain. She asked what the next steps would be?  Please advise.

## 2021-10-10 NOTE — Progress Notes (Unsigned)
    Bianca Wolf is a 81 y.o. female who presents to [facility] today for f/u of chronic R lower back pain concerning for an acute vertebral compression fracture at L5.  She was last seen by Dr. Denyse Amass on 09/07/21 and was referred for a lumbar MRI that she had on 09/12/21.  Based on those results, she was referred to IR and had an L3 kyphoplasty on 09/21/21.  She then had a R L4-5 ESI on 10/03/21.  Today, pt reports   Diagnostic testing: L-spine MRI- 09/28/21, 09/12/21;   Pertinent review of systems: ***  Relevant historical information: ***   Exam:  There were no vitals taken for this visit. General: Well Developed, well nourished, and in no acute distress.   MSK: ***    Lab and Radiology Results No results found for this or any previous visit (from the past 72 hour(s)). No results found.     Assessment and Plan: 81 y.o. female with ***   PDMP not reviewed this encounter. No orders of the defined types were placed in this encounter.  No orders of the defined types were placed in this encounter.    Discussed warning signs or symptoms. Please see discharge instructions. Patient expresses understanding.   ***

## 2021-10-13 ENCOUNTER — Ambulatory Visit: Payer: Medicare HMO | Admitting: Family Medicine

## 2021-10-13 VITALS — BP 128/76 | HR 70 | Ht 64.0 in | Wt 155.6 lb

## 2021-10-13 DIAGNOSIS — Z8781 Personal history of (healed) traumatic fracture: Secondary | ICD-10-CM

## 2021-10-13 DIAGNOSIS — G8929 Other chronic pain: Secondary | ICD-10-CM | POA: Diagnosis not present

## 2021-10-13 DIAGNOSIS — M545 Low back pain, unspecified: Secondary | ICD-10-CM

## 2021-10-13 DIAGNOSIS — M47817 Spondylosis without myelopathy or radiculopathy, lumbosacral region: Secondary | ICD-10-CM

## 2021-10-16 ENCOUNTER — Other Ambulatory Visit: Payer: Self-pay | Admitting: Internal Medicine

## 2021-10-16 DIAGNOSIS — S32040A Wedge compression fracture of fourth lumbar vertebra, initial encounter for closed fracture: Secondary | ICD-10-CM

## 2021-10-16 NOTE — Therapy (Signed)
OUTPATIENT PHYSICAL THERAPY THORACOLUMBAR EVALUATION   Patient Name: Bianca Wolf MRN: [id number] DOB:Dec 03, 1940, 81 y.o., female Today's Date: 10/17/2021   PT End of Session - 10/17/21 0850     Visit Number 1    Number of Visits 12    Date for PT Re-Evaluation 11/28/21    Authorization Type humana- auth requested    Progress Note Due on Visit 10    PT Start Time 0850    PT Stop Time 0927    PT Time Calculation (min) 37 min    Activity Tolerance Patient limited by pain             Past Medical History:  Diagnosis Date   Hypertension    Past Surgical History:  Procedure Laterality Date   IR KYPHO LUMBAR INC FX REDUCE BONE BX UNI/BIL CANNULATION INC/IMAGING  09/21/2021   Patient Active Problem List   Diagnosis Date Noted   Age-related osteoporosis without current pathological fracture 09/08/2021   Allergic rhinitis 09/08/2021   Essential hypertension 09/08/2021   Hypertriglyceridemia 09/08/2021   Gastroesophageal reflux disease 09/08/2021   Increased frequency of urination 09/08/2021   Overactive bladder 09/08/2021   Hyperthyroidism 09/08/2021   Unspecified asthma, uncomplicated 09/08/2021   Vitamin D deficiency 09/08/2021   History of vertebral compression fracture 09/07/2021    PCP: Merri Brunette, MD  REFERRING PROVIDER:  Rodolph Bong, MD  REFERRING DIAG: [phone] (ICD-10-CM) - Facet arthritis of lumbosacral region M54.50,G89.29 (ICD-10-CM) - Chronic bilateral low back pain without sciatica  Rationale for Evaluation and Treatment Rehabilitation  THERAPY DIAG:  Other low back pain  Muscle weakness (generalized)  ONSET DATE: 6  SUBJECTIVE:                                                                                                                                                                                           SUBJECTIVE STATEMENT: States that the more she sits or lays the more it hurts to get back up. States that she had one shot and it  didn't do anything. States that she has been on pain meds but it hasn't done anything. States that she is aching and she can't lay long. States she was working at [employer] and the pain came on and then it went away and then it came back. States she is no longer working. States she wants to go back to work but doesn't know if she can. Sitting in hard bottom chair is more comfortable than a soft chair  PERTINENT HISTORY:  L5 compression L3 kyphoplasty on 09/21/21 R ESI L4-5 10/03/21 20 years ago lumbar surgery no hardware PAIN:  Are you having pain? Yes: NPRS scale: 8/10 Pain location: low back and tail bone  Pain description: aching, constant  Aggravating factors: laying, sitting Relieving factors: movement   PRECAUTIONS: None  WEIGHT BEARING RESTRICTIONS No  FALLS:  Has patient fallen in last 6 months? Yes. Number of falls 1  LIVING ENVIRONMENT: Lives with: lives with their family No AD equipment  OCCUPATION: works at [employer] day 8 hrs a day 4 days a week  PLOF: Independent  PATIENT GOALS to have less pain   OBJECTIVE:   DIAGNOSTIC FINDINGS:  09/28/21 MRI IMPRESSION: 1. Status post L3 kyphoplasty. Progressive edema in the vertebral body and bilateral pedicles with possible slight interval increase in height loss (approximately 20-30%). 2. 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   SCREENING FOR RED FLAGS: Bowel or bladder incontinence: No Spinal tumors: No Cauda equina syndrome: No Compression fracture: No Abdominal aneurysm: No  COGNITION:  Overall cognitive status: Within functional limits for tasks assessed     SENSATION: WFL    POSTURE: rounded shoulders, forward head, increased thoracic kyphosis, and flexed trunk   PALPATION: Tenderness to palpation along B lumbar paraspinals, QL and glutes   LUMBAR ROM: - not tested fall risk and too much pain  Active   A/PROM  eval  Flexion   Extension   Right lateral flexion   Left lateral flexion   Right rotation   Left rotation    (Blank rows = not tested)     LE Measurements Lower Extremity Right 10/17/2021 Left 10/17/2021   A/PROM MMT A/PROM MMT  Hip Flexion      Hip Extension      Hip Abduction      Hip Adduction      Hip Internal rotation 50  50   Hip External rotation 30*  50   Knee Flexion      Knee Extension      Ankle Dorsiflexion      Ankle Plantarflexion      Ankle Inversion      Ankle Eversion       (Blank rows = not tested)  * pain   LUMBAR SPECIAL TESTS:  Lumbar distraction - relief noted in back  Ely's test + B Repeated extensions in prone - improved ROM and reduced pain  FUNCTIONAL TESTS:  STS - slow labored movements, needs physical assist to stand upright- painful Bed mobilities - slow labored movements and Min assist to get up - painful  GAIT: Distance walked: 25 feet Assistive device utilized: None Level of assistance: Modified independence Comments: wide base of support, slow gait, grabs walls to stabilize self, stiff upright gait    TODAY'S TREATMENT  10/17/2021 Therapeutic Exercise:  Aerobic: Supine: 90/90 - felt better  Prone:laying  on 2 pillows then 1 pillow then no pillow 15 minutes  Seated:  Standing:wall lumbar extensions - not tolerated Neuromuscular Re-education: Manual Therapy: Therapeutic Activity: Self Care: Trigger Point Dry Needling:  Modalities:    PATIENT EDUCATION:  Education details: on current presentation, on HEP, on clinical outcomes score and POC, traction, getting into pool  Person educated: Patient Education method: Explanation, Demonstration, and Handouts Education comprehension: verbalized understanding   HOME EXERCISE PROGRAM: [id number]  ASSESSMENT:  CLINICAL IMPRESSION: Patient is a 81 y.o. female who was seen today for physical therapy evaluation and treatment for low back pain. Session limited secondary  to pain and difficulties getting in and out of different positions. Educated  patient on possible benefits of aquatics and decompressing lumbar spine. Educated patient on how to safely get in and out of pool at [facility] with someone with her. Patient would greatly benefit from skilled PT to improve overall function and QOL.   OBJECTIVE IMPAIRMENTS Abnormal gait, decreased activity tolerance, decreased balance, decreased knowledge of use of DME, decreased mobility, difficulty walking, decreased ROM, decreased strength, increased muscle spasms, postural dysfunction, and pain.   ACTIVITY LIMITATIONS bending, sitting, standing, sleeping, bed mobility, toileting, and locomotion level  PARTICIPATION LIMITATIONS: meal prep, cleaning, community activity, and occupation  PERSONAL FACTORS 1-2 comorbidities: low back surgery,  are also affecting patient's functional outcome.   REHAB POTENTIAL: Good  CLINICAL DECISION MAKING: Stable/uncomplicated  EVALUATION COMPLEXITY: Low  GOALS: Goals reviewed with patient?  yes  SHORT TERM GOALS:  Patient will be independent in self management strategies to improve quality of life and functional outcomes. Baseline: new program Target date: 11/07/2021 Goal status: INITIAL  2.  Patient will report at least 50% improvement in overall symptoms and/or function to demonstrate improved functional mobility Baseline: 0% Target date: 11/07/2021 Goal status: INITIAL  3.  Patient will be able to transition from STS without physical assist or heavy use of UE to improve transitional mobility Baseline: unable Target date: 11/07/2021 Goal status: INITIAL      LONG TERM GOALS:  Patient will report at least 75% improvement in overall symptoms and/or function to demonstrate improved functional mobility Baseline: 0% Target date: 11/28/2021 Goal status: INITIAL  2.  Patient will be able to get up from a chair and use the restroom without having increase in pain. Baseline:  painful Target date: 11/28/2021 Goal status: INITIAL  3.  Patient will be able to sit, stand and lay down without severe pain to improve tolerance to static positions. Baseline: painful Target date: 11/28/2021 Goal status: INITIAL    PLAN: PT FREQUENCY: 2x/week  PT DURATION: 6 weeks  PLANNED INTERVENTIONS: Therapeutic exercises, Therapeutic activity, Neuromuscular re-education, Balance training, Gait training, Patient/Family education, Joint manipulation, Joint mobilization, Aquatic Therapy, Dry Needling, Electrical stimulation, Spinal manipulation, Spinal mobilization, Cryotherapy, Moist heat, Ionotophoresis 4mg /ml Dexamethasone, Manual therapy, and Re-evaluation.  PLAN FOR NEXT SESSION:  traction, isometrics, ROM,  prone exercises/ ext based exercises-   10:15 AM, 10/17/21 Tereasa Coop, DPT Physical Therapy with

## 2021-10-17 ENCOUNTER — Ambulatory Visit (INDEPENDENT_AMBULATORY_CARE_PROVIDER_SITE_OTHER): Payer: Medicare HMO | Admitting: Physical Therapy

## 2021-10-17 ENCOUNTER — Ambulatory Visit: Payer: Medicare HMO | Admitting: Physical Therapy

## 2021-10-17 ENCOUNTER — Encounter: Payer: Self-pay | Admitting: Physical Therapy

## 2021-10-17 DIAGNOSIS — M5459 Other low back pain: Secondary | ICD-10-CM

## 2021-10-17 DIAGNOSIS — M6281 Muscle weakness (generalized): Secondary | ICD-10-CM

## 2021-10-23 DIAGNOSIS — R300 Dysuria: Secondary | ICD-10-CM | POA: Diagnosis not present

## 2021-10-23 DIAGNOSIS — M810 Age-related osteoporosis without current pathological fracture: Secondary | ICD-10-CM | POA: Diagnosis not present

## 2021-10-25 DIAGNOSIS — I10 Essential (primary) hypertension: Secondary | ICD-10-CM | POA: Diagnosis not present

## 2021-10-25 DIAGNOSIS — E0590 Thyrotoxicosis, unspecified without thyrotoxic crisis or storm: Secondary | ICD-10-CM | POA: Diagnosis not present

## 2021-10-26 ENCOUNTER — Ambulatory Visit
Admission: RE | Admit: 2021-10-26 | Discharge: 2021-10-26 | Disposition: A | Discharge status: Home / Self Care | Payer: Medicare HMO | Source: Ambulatory Visit | Attending: Interventional Radiology | Admitting: Interventional Radiology

## 2021-10-26 ENCOUNTER — Ambulatory Visit
Admission: RE | Admit: 2021-10-26 | Discharge: 2021-10-26 | Disposition: A | Discharge status: Home / Self Care | Payer: Medicare HMO | Source: Ambulatory Visit | Attending: Family Medicine | Admitting: Family Medicine

## 2021-10-26 DIAGNOSIS — M545 Low back pain, unspecified: Secondary | ICD-10-CM

## 2021-10-26 DIAGNOSIS — M4856XA Collapsed vertebra, not elsewhere classified, lumbar region, initial encounter for fracture: Secondary | ICD-10-CM | POA: Diagnosis not present

## 2021-10-26 DIAGNOSIS — M47817 Spondylosis without myelopathy or radiculopathy, lumbosacral region: Secondary | ICD-10-CM

## 2021-10-26 DIAGNOSIS — Z712 Person consulting for explanation of examination or test findings: Secondary | ICD-10-CM

## 2021-10-26 MED ORDER — METHYLPREDNISOLONE ACETATE 40 MG/ML INJ SUSP (RADIOLOG
80.0000 mg | Freq: Once | INTRAMUSCULAR | Status: AC
  Administered 2021-10-26: 80 mg via INTRA_ARTICULAR

## 2021-10-26 MED ORDER — IOPAMIDOL (ISOVUE-M 200) INJECTION 41%
1.0000 mL | Freq: Once | INTRAMUSCULAR | Status: AC
  Administered 2021-10-26: 1 mL via INTRA_ARTICULAR

## 2021-10-26 NOTE — Discharge Instructions (Signed)

## 2021-10-26 NOTE — Discharge Instructions (Signed)

## 2021-10-30 ENCOUNTER — Encounter: Payer: Self-pay | Admitting: Physical Therapy

## 2021-10-30 ENCOUNTER — Ambulatory Visit (INDEPENDENT_AMBULATORY_CARE_PROVIDER_SITE_OTHER): Payer: Medicare HMO | Admitting: Physical Therapy

## 2021-10-30 DIAGNOSIS — M5459 Other low back pain: Secondary | ICD-10-CM | POA: Diagnosis not present

## 2021-10-30 DIAGNOSIS — M6281 Muscle weakness (generalized): Secondary | ICD-10-CM | POA: Diagnosis not present

## 2021-10-30 NOTE — Therapy (Signed)
OUTPATIENT PHYSICAL THERAPY TREATMENT NOTE   Patient Name: Bianca Wolf MRN: [id number] DOB:Dec 18, 1940, 81 y.o., female Today's Date: 10/30/2021  END OF SESSION:   PT End of Session - 10/30/21 1501     Visit Number 2    Number of Visits 12    Date for PT Re-Evaluation 11/28/21    Authorization Type humana- auth approved from 10/17/21 to 12/12/21    Authorization - Visit Number 2    Authorization - Number of Visits 12    Progress Note Due on Visit 10    PT Start Time 1515    PT Stop Time 1553    PT Time Calculation (min) 38 min    Activity Tolerance Patient limited by pain             Past Medical History:  Diagnosis Date   Hypertension    Past Surgical History:  Procedure Laterality Date   IR KYPHO LUMBAR INC FX REDUCE BONE BX UNI/BIL CANNULATION INC/IMAGING  09/21/2021   Patient Active Problem List   Diagnosis Date Noted   Age-related osteoporosis without current pathological fracture 09/08/2021   Allergic rhinitis 09/08/2021   Essential hypertension 09/08/2021   Hypertriglyceridemia 09/08/2021   Gastroesophageal reflux disease 09/08/2021   Increased frequency of urination 09/08/2021   Overactive bladder 09/08/2021   Hyperthyroidism 09/08/2021   Unspecified asthma, uncomplicated 09/08/2021   Vitamin D deficiency 09/08/2021   History of vertebral compression fracture 09/07/2021      PCP: Merri Brunette, MD   REFERRING PROVIDER:  Rodolph Bong, MD   REFERRING DIAG: [phone] (ICD-10-CM) - Facet arthritis of lumbosacral region M54.50,G89.29 (ICD-10-CM) - Chronic bilateral low back pain without sciatica   Rationale for Evaluation and Treatment Rehabilitation   THERAPY DIAG:  Other low back pain   Muscle weakness (generalized)   ONSET DATE: 6   SUBJECTIVE:                                                                                                                                                                                            SUBJECTIVE  STATEMENT: 10/30/2021 States she floated in pool and she is going to try to go every  week and she feels better with moving. States she got an injection and it feels better.   Eval: States that the more she sits or lays the more it hurts to get back up. States that she had one shot and it didn't do anything. States that she has been on pain meds but it hasn't done anything. States that she is aching and she can't lay long. 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 States she is no longer working. States she wants to go back to work but doesn't know if she can. Sitting in hard bottom chair is more comfortable than a soft chair   PERTINENT HISTORY:  L5 compression L3 kyphoplasty on 09/21/21 R ESI L4-5 10/03/21 20 years ago lumbar surgery no hardware PAIN:  Are you having pain? Yes: NPRS scale: 8/10 Pain location: low back and tail bone  Pain description: aching, constant  Aggravating factors: laying, sitting Relieving factors: movement     PRECAUTIONS: None   WEIGHT BEARING RESTRICTIONS No   FALLS:  Has patient fallen in last 6 months? Yes. Number of falls 1   LIVING ENVIRONMENT: Lives with: lives with their family No AD equipment   OCCUPATION: works at [employer] day 8 hrs a day 4 days a week   PLOF: Independent   PATIENT GOALS to have less pain     OBJECTIVE:    DIAGNOSTIC FINDINGS:  09/28/21 MRI IMPRESSION: 1. Status post L3 kyphoplasty. Progressive edema in the vertebral body and bilateral pedicles with possible slight interval increase in height loss (approximately 20-30%). 2. 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     SCREENING FOR RED FLAGS: Bowel or bladder incontinence: No Spinal tumors: No Cauda equina syndrome: No Compression fracture: No Abdominal aneurysm: No   COGNITION:           Overall cognitive  status: Within functional limits for tasks assessed                          SENSATION: WFL       POSTURE: rounded shoulders, forward head, increased thoracic kyphosis, and flexed trunk    PALPATION: Tenderness to palpation along B lumbar paraspinals, QL and glutes    LUMBAR ROM: - not tested fall risk and too much pain   Active  A/PROM  eval  Flexion    Extension    Right lateral flexion    Left lateral flexion    Right rotation    Left rotation     (Blank rows = not tested)                  LE Measurements       Lower Extremity Right 10/17/2021 Left 10/17/2021    A/PROM MMT A/PROM MMT  Hip Flexion          Hip Extension          Hip Abduction          Hip Adduction          Hip Internal rotation 50   50    Hip External rotation 30*   50    Knee Flexion          Knee Extension          Ankle Dorsiflexion          Ankle Plantarflexion          Ankle Inversion          Ankle Eversion           (Blank rows = not tested)            * pain     LUMBAR SPECIAL TESTS:  Lumbar distraction - relief noted in back  Ely's test + B Repeated extensions in  prone - improved ROM and reduced pain   FUNCTIONAL TESTS:  STS - slow labored movements, needs physical assist to stand upright- painful Bed mobilities - slow labored movements and Min assist to get up - painful   GAIT: Distance walked: 25 feet Assistive device utilized: None Level of assistance: Modified independence Comments: wide base of support, slow gait, grabs walls to stabilize self, stiff upright gait       TODAY'S TREATMENT  10/30/2021  Therapeutic Exercise:    Aerobic:     Seated: hamstring stretch x3 30" holds,  lumbar flexion 2x20    Standing:wall rotations 2x15 B lumbar SB 2x15, lumbar extension 2x15 5" holds, shoulder flexion up wall x20 B , hip extension at counter 3x10 B slow and with UE support, hip abd 3x10 B Neuromuscular Re-education: Manual Therapy: Therapeutic Activity: Self  Care: Trigger Point Dry Needling:  Modalities:  Self care: wound care education with xeroform and bandages  to left medial ankle wound   PATIENT EDUCATION:  Education details: on current presentation, on keeping wound covered  Person educated: Patient Education method: Explanation, Demonstration, and Handouts Education comprehension: verbalized understanding     HOME EXERCISE PROGRAM: [id number]   ASSESSMENT:   CLINICAL IMPRESSION: 10/30/2021 Session focused on standing exercises secondary to poor tolerance with seated and supine exercises. This was tolerated well but patient fatigue quickly. Educated patient  on proper wound care. Redness less than previous session but still present. Patient to see MD tomorrow, instructed patient to show/f/u with MD about wound on ankle. Most concerning is the location as it is right over the medial malleoli. Will continue to monitor.  Eval: Patient is a 81 y.o. female who was seen today for physical therapy evaluation and treatment for low back pain. Session limited secondary to pain and difficulties getting in and out of different positions. Educated patient on possible benefits of aquatics and decompressing lumbar spine. Educated patient on how to safely get in and out of pool at [facility] with someone with her. Patient would greatly benefit from skilled PT to improve overall function and QOL.     OBJECTIVE IMPAIRMENTS Abnormal gait, decreased activity tolerance, decreased balance, decreased knowledge of use of DME, decreased mobility, difficulty walking, decreased ROM, decreased strength, increased muscle spasms, postural dysfunction, and pain.    ACTIVITY LIMITATIONS bending, sitting, standing, sleeping, bed mobility, toileting, and locomotion level   PARTICIPATION LIMITATIONS: meal prep, cleaning, community activity, and occupation   PERSONAL FACTORS 1-2 comorbidities: low back surgery,  are also affecting patient's functional outcome.    REHAB  POTENTIAL: Good   CLINICAL DECISION MAKING: Stable/uncomplicated   EVALUATION COMPLEXITY: Low   GOALS: Goals reviewed with patient?  yes   SHORT TERM GOALS:   Patient will be independent in self management strategies to improve quality of life and functional outcomes. Baseline: new program Target date: 11/07/2021 Goal status: INITIAL   2.  Patient will report at least 50% improvement in overall symptoms and/or function to demonstrate improved functional mobility Baseline: 0% Target date: 11/07/2021 Goal status: INITIAL   3.  Patient will be able to transition from STS without physical assist or heavy use of UE to improve transitional mobility Baseline: unable Target date: 11/07/2021 Goal status: INITIAL           LONG TERM GOALS:   Patient will report at least 75% improvement in overall symptoms and/or function to demonstrate improved functional mobility Baseline: 0% Target date: 11/28/2021 Goal status: INITIAL   2.  Patient  will be able to get up from a chair and use the restroom without having increase in pain. Baseline: painful Target date: 11/28/2021 Goal status: INITIAL   3.  Patient will be able to sit, stand and lay down without severe pain to improve tolerance to static positions. Baseline: painful Target date: 11/28/2021 Goal status: INITIAL       PLAN: PT FREQUENCY: 2x/week   PT DURATION: 6 weeks   PLANNED INTERVENTIONS: Therapeutic exercises, Therapeutic activity, Neuromuscular re-education, Balance training, Gait training, Patient/Family education, Joint manipulation, Joint mobilization, Aquatic Therapy, Dry Needling, Electrical stimulation, Spinal manipulation, Spinal mobilization, Cryotherapy, Moist heat, Ionotophoresis 4mg /ml Dexamethasone, Manual therapy, and Re-evaluation.   PLAN FOR NEXT SESSION:  traction, isometrics, ROM,  prone exercises/ ext based exercises-standing exercises     4:00 PM, 10/30/21 12/31/21, DPT Physical Therapy with  [facility]

## 2021-10-31 DIAGNOSIS — H40013 Open angle with borderline findings, low risk, bilateral: Secondary | ICD-10-CM | POA: Diagnosis not present

## 2021-11-01 ENCOUNTER — Encounter: Payer: Self-pay | Admitting: Physical Therapy

## 2021-11-01 DIAGNOSIS — M8000XA Age-related osteoporosis with current pathological fracture, unspecified site, initial encounter for fracture: Secondary | ICD-10-CM | POA: Diagnosis not present

## 2021-11-01 DIAGNOSIS — D649 Anemia, unspecified: Secondary | ICD-10-CM | POA: Diagnosis not present

## 2021-11-01 DIAGNOSIS — I10 Essential (primary) hypertension: Secondary | ICD-10-CM | POA: Diagnosis not present

## 2021-11-01 DIAGNOSIS — K219 Gastro-esophageal reflux disease without esophagitis: Secondary | ICD-10-CM | POA: Diagnosis not present

## 2021-11-01 DIAGNOSIS — S32040A Wedge compression fracture of fourth lumbar vertebra, initial encounter for closed fracture: Secondary | ICD-10-CM | POA: Diagnosis not present

## 2021-11-01 DIAGNOSIS — Z0000 Encounter for general adult medical examination without abnormal findings: Secondary | ICD-10-CM | POA: Diagnosis not present

## 2021-11-01 DIAGNOSIS — N3000 Acute cystitis without hematuria: Secondary | ICD-10-CM | POA: Diagnosis not present

## 2021-11-06 ENCOUNTER — Encounter: Payer: Self-pay | Admitting: Physical Therapy

## 2021-11-06 ENCOUNTER — Ambulatory Visit: Payer: Medicare HMO | Admitting: Physical Therapy

## 2021-11-06 DIAGNOSIS — M6281 Muscle weakness (generalized): Secondary | ICD-10-CM

## 2021-11-06 DIAGNOSIS — M5459 Other low back pain: Secondary | ICD-10-CM | POA: Diagnosis not present

## 2021-11-06 NOTE — Therapy (Signed)
OUTPATIENT PHYSICAL THERAPY TREATMENT NOTE   Patient Name: Bianca Wolf MRN: [id number] DOB:1940-11-26, 81 y.o., female Today's Date: 11/06/2021  END OF SESSION:   PT End of Session - 11/06/21 1514     Visit Number 3    Number of Visits 12    Date for PT Re-Evaluation 11/28/21    Authorization Type humana- auth approved from 10/17/21 to 12/12/21    Authorization - Visit Number 3    Authorization - Number of Visits 12    Progress Note Due on Visit 10    PT Start Time [id number]    PT Stop Time 1556    PT Time Calculation (min) 40 min    Activity Tolerance Patient limited by pain             Past Medical History:  Diagnosis Date   Hypertension    Past Surgical History:  Procedure Laterality Date   IR KYPHO LUMBAR INC FX REDUCE BONE BX UNI/BIL CANNULATION INC/IMAGING  09/21/2021   Patient Active Problem List   Diagnosis Date Noted   Age-related osteoporosis without current pathological fracture 09/08/2021   Allergic rhinitis 09/08/2021   Essential hypertension 09/08/2021   Hypertriglyceridemia 09/08/2021   Gastroesophageal reflux disease 09/08/2021   Increased frequency of urination 09/08/2021   Overactive bladder 09/08/2021   Hyperthyroidism 09/08/2021   Unspecified asthma, uncomplicated [id number]   Vitamin D deficiency 09/08/2021   History of vertebral compression fracture 09/07/2021      PCP: [name], MD   REFERRING PROVIDER:  Gregor Hams, MD   REFERRING DIAG: [phone] (ICD-10-CM) - Facet arthritis of lumbosacral region M54.50,G89.29 (ICD-10-CM) - Chronic bilateral low back pain without sciatica   Rationale for Evaluation and Treatment Rehabilitation   THERAPY DIAG:  Other low back pain   Muscle weakness (generalized)   ONSET DATE: 6   SUBJECTIVE:                                                                                                                                                                                            SUBJECTIVE  STATEMENT: 11/06/2021 Stats that her back is still hurting. States she saw her MD and he looked at the wound and told her to continue to keep it covered. States that her iron is low so she needs to see another MD. States that she went to the pool and it seemed to help a bit.   Eval: States that the more she sits or lays the more it hurts to get back up. States that she had one shot and it didn't do anything. States that she has been on pain  meds but it hasn't done anything. States that she is aching and she can't lay long. States she was working at [employer] and the pain came on and then it went away and then it came back. States she is no longer working. States she wants to go back to work but doesn't know if she can. Sitting in hard bottom chair is more comfortable than a soft chair   PERTINENT HISTORY:  L5 compression L3 kyphoplasty on 09/21/21 R ESI L4-5 10/03/21 20 years ago lumbar surgery no hardware PAIN:  Are you having pain? Yes: NPRS scale: 8/10 Pain location: low back and tail bone  Pain description: aching, constant  Aggravating factors: laying, sitting Relieving factors: movement     PRECAUTIONS: None   WEIGHT BEARING RESTRICTIONS No   FALLS:  Has patient fallen in last 6 months? Yes. Number of falls 1   LIVING ENVIRONMENT: Lives with: lives with their family No AD equipment   OCCUPATION: works at [employer] day 8 hrs a day 4 days a week   PLOF: Independent   PATIENT GOALS to have less pain     OBJECTIVE:    DIAGNOSTIC FINDINGS:  09/28/21 MRI IMPRESSION: 1. Status post L3 kyphoplasty. Progressive edema in the vertebral body and bilateral pedicles with possible slight interval increase in height loss (approximately 20-30%). 2. 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     SCREENING FOR RED FLAGS: Bowel or bladder incontinence: No Spinal tumors: No Cauda equina  syndrome: No Compression fracture: No Abdominal aneurysm: No   COGNITION:           Overall cognitive status: Within functional limits for tasks assessed                          SENSATION: WFL       POSTURE: rounded shoulders, forward head, increased thoracic kyphosis, and flexed trunk    PALPATION: Tenderness to palpation along B lumbar paraspinals, QL and glutes    LUMBAR ROM: - not tested fall risk and too much pain   Active  A/PROM  eval  Flexion    Extension    Right lateral flexion    Left lateral flexion    Right rotation    Left rotation     (Blank rows = not tested)                  LE Measurements       Lower Extremity Right 10/17/2021 Left 10/17/2021    A/PROM MMT A/PROM MMT  Hip Flexion          Hip Extension          Hip Abduction          Hip Adduction          Hip Internal rotation 50   50    Hip External rotation 30*   50    Knee Flexion          Knee Extension          Ankle Dorsiflexion          Ankle Plantarflexion          Ankle Inversion          Ankle Eversion           (Blank rows = not tested)            * pain  LUMBAR SPECIAL TESTS:  Lumbar distraction - relief noted in back  Ely's test + B Repeated extensions in prone - improved ROM and reduced pain   FUNCTIONAL TESTS:  STS - slow labored movements, needs physical assist to stand upright- painful Bed mobilities - slow labored movements and Min assist to get up - painful   GAIT: Distance walked: 25 feet Assistive device utilized: None Level of assistance: Modified independence Comments: wide base of support, slow gait, grabs walls to stabilize self, stiff upright gait       TODAY'S TREATMENT  11/06/2021 Therapeutic Exercise:    Seated: hamstring stretch x3 30" holds,  lumbar flexion 2x20, lumbar rotation x15 5" holds     Standing:wall rotations 2x15 B lumbar SB 2x15, lumbar extension 2x15 5" holds, self mobilization to lumbar muscles with tennis ball 5 minutes, hip  extension at counter 3x10 B slow and with UE support, hip abd 3x10 B Neuromuscular Re-education: Manual Therapy:IASTM with percussion gun for vibration 9 minutes Therapeutic Activity: Self Care: Trigger Point Dry Needling:  Modalities:    PATIENT EDUCATION:  Education details: on current presentation, on HEP Person educated: Patient Education method: Explanation, Demonstration, and Handouts Education comprehension: verbalized understanding     HOME EXERCISE PROGRAM: [email or web address]   ASSESSMENT:   CLINICAL IMPRESSION: 11/06/2021 Session focused on HEP and review of HEP secondary to poor adherence with HEP. Per request no table exercises on this date as this is a very aggravating position. Trailed percussion/vibration which was tolerated well reporting reduced pain afterwards. Educated patient on types of percussion guns and how to safely use if interested in buying one.   Eval: Patient is a 81 y.o. female who was seen today for physical therapy evaluation and treatment for low back pain. Session limited secondary to pain and difficulties getting in and out of different positions. Educated patient on possible benefits of aquatics and decompressing lumbar spine. Educated patient on how to safely get in and out of pool at [facility] with someone with her. Patient would greatly benefit from skilled PT to improve overall function and QOL.     OBJECTIVE IMPAIRMENTS Abnormal gait, decreased activity tolerance, decreased balance, decreased knowledge of use of DME, decreased mobility, difficulty walking, decreased ROM, decreased strength, increased muscle spasms, postural dysfunction, and pain.    ACTIVITY LIMITATIONS bending, sitting, standing, sleeping, bed mobility, toileting, and locomotion level   PARTICIPATION LIMITATIONS: meal prep, cleaning, community activity, and occupation   PERSONAL FACTORS 1-2 comorbidities: low back surgery,  are also affecting patient's functional outcome.    REHAB  POTENTIAL: Good   CLINICAL DECISION MAKING: Stable/uncomplicated   EVALUATION COMPLEXITY: Low   GOALS: Goals reviewed with patient?  yes   SHORT TERM GOALS:   Patient will be independent in self management strategies to improve quality of life and functional outcomes. Baseline: new program Target date: 11/07/2021 Goal status: INITIAL   2.  Patient will report at least 50% improvement in overall symptoms and/or function to demonstrate improved functional mobility Baseline: 0% Target date: 11/07/2021 Goal status: INITIAL   3.  Patient will be able to transition from STS without physical assist or heavy use of UE to improve transitional mobility Baseline: unable Target date: 11/07/2021 Goal status: INITIAL           LONG TERM GOALS:   Patient will report at least 75% improvement in overall symptoms and/or function to demonstrate improved functional mobility Baseline: 0% Target date: 11/28/2021 Goal status: INITIAL   2.  Patient will  be able to get up from a chair and use the restroom without having increase in pain. Baseline: painful Target date: 11/28/2021 Goal status: INITIAL   3.  Patient will be able to sit, stand and lay down without severe pain to improve tolerance to static positions. Baseline: painful Target date: 11/28/2021 Goal status: INITIAL       PLAN: PT FREQUENCY: 2x/week   PT DURATION: 6 weeks   PLANNED INTERVENTIONS: Therapeutic exercises, Therapeutic activity, Neuromuscular re-education, Balance training, Gait training, Patient/Family education, Joint manipulation, Joint mobilization, Aquatic Therapy, Dry Needling, Electrical stimulation, Spinal manipulation, Spinal mobilization, Cryotherapy, Moist heat, Ionotophoresis 4mg /ml Dexamethasone, Manual therapy, and Re-evaluation.   PLAN FOR NEXT SESSION:  traction, isometrics, ROM,  prone exercises/ ext based exercises-standing exercises     3:59 PM, 11/06/21 11/08/21, DPT Physical Therapy with  [facility]

## 2021-11-08 ENCOUNTER — Ambulatory Visit (INDEPENDENT_AMBULATORY_CARE_PROVIDER_SITE_OTHER): Payer: Medicare HMO | Admitting: Physical Therapy

## 2021-11-08 ENCOUNTER — Encounter: Payer: Self-pay | Admitting: Physical Therapy

## 2021-11-08 DIAGNOSIS — M6281 Muscle weakness (generalized): Secondary | ICD-10-CM

## 2021-11-08 DIAGNOSIS — M5459 Other low back pain: Secondary | ICD-10-CM | POA: Diagnosis not present

## 2021-11-08 NOTE — Therapy (Signed)
OUTPATIENT PHYSICAL THERAPY TREATMENT NOTE   Patient Name: Bianca Wolf MRN: [id number] DOB:[birth year], 81 y.o., female Today's Date: 11/08/2021  END OF SESSION:   PT End of Session - 11/08/21 1428     Visit Number 4    Number of Visits 12    Date for PT Re-Evaluation 11/28/21    Authorization Type humana- auth approved from 10/17/21 to 12/12/21    Authorization - Visit Number 4    Authorization - Number of Visits 12    Progress Note Due on Visit 10    PT Start Time 1431    PT Stop Time 1509    PT Time Calculation (min) 38 min    Activity Tolerance Patient limited by pain             Past Medical History:  Diagnosis Date   Hypertension    Past Surgical History:  Procedure Laterality Date   IR KYPHO LUMBAR INC FX REDUCE BONE BX UNI/BIL CANNULATION INC/IMAGING  09/21/2021   Patient Active Problem List   Diagnosis Date Noted   Age-related osteoporosis without current pathological fracture 09/08/2021   Allergic rhinitis 09/08/2021   Essential hypertension 09/08/2021   Hypertriglyceridemia 09/08/2021   Gastroesophageal reflux disease 09/08/2021   Increased frequency of urination 09/08/2021   Overactive bladder 09/08/2021   Hyperthyroidism 09/08/2021   Unspecified asthma, uncomplicated 09/08/2021   Vitamin D deficiency 09/08/2021   History of vertebral compression fracture 09/07/2021      PCP: Merri Brunette, MD   REFERRING PROVIDER:  Rodolph Bong, MD   REFERRING DIAG: [phone] (ICD-10-CM) - Facet arthritis of lumbosacral region M54.50,G89.29 (ICD-10-CM) - Chronic bilateral low back pain without sciatica   Rationale for Evaluation and Treatment Rehabilitation   THERAPY DIAG:  Other low back pain   Muscle weakness (generalized)   ONSET DATE: 6   SUBJECTIVE:                                                                                                                                                                                            SUBJECTIVE  STATEMENT: 11/08/2021 States she has been having more pain and soreness on both side.   Eval: States that the more she sits or lays the more it hurts to get back up. States that she had one shot and it didn't do anything. States that she has been on pain meds but it hasn't done anything. States that she is aching and she can't lay long. States she was working at [employer] and the pain came on and then it went away and then it came back. States she is  no longer working. States she wants to go back to work but doesn't know if she can. Sitting in hard bottom chair is more comfortable than a soft chair   PERTINENT HISTORY:  L5 compression L3 kyphoplasty on 09/21/21 R ESI L4-5 10/03/21 20 years ago lumbar surgery no hardware PAIN:  Are you having pain? Yes: NPRS scale: 8/10 Pain location: low back and tail bone  Pain description: aching, constant  Aggravating factors: laying, sitting Relieving factors: movement     PRECAUTIONS: None   WEIGHT BEARING RESTRICTIONS No   FALLS:  Has patient fallen in last 6 months? Yes. Number of falls 1   LIVING ENVIRONMENT: Lives with: lives with their family No AD equipment   OCCUPATION: works at [employer] day 8 hrs a day 4 days a week   PLOF: Independent   PATIENT GOALS to have less pain     OBJECTIVE:    DIAGNOSTIC FINDINGS:  09/28/21 MRI IMPRESSION: 1. Status post L3 kyphoplasty. Progressive edema in the vertebral body and bilateral pedicles with possible slight interval increase in height loss (approximately 20-30%). 2. 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     SCREENING FOR RED FLAGS: Bowel or bladder incontinence: No Spinal tumors: No Cauda equina syndrome: No Compression fracture: No Abdominal aneurysm: No   COGNITION:           Overall cognitive status: Within functional limits for tasks assessed                           SENSATION: WFL       POSTURE: rounded shoulders, forward head, increased thoracic kyphosis, and flexed trunk    PALPATION: Tenderness to palpation along B lumbar paraspinals, QL and glutes    LUMBAR ROM: - not tested fall risk and too much pain   Active  A/PROM  eval  Flexion    Extension    Right lateral flexion    Left lateral flexion    Right rotation    Left rotation     (Blank rows = not tested)                  LE Measurements       Lower Extremity Right 10/17/2021 Left 10/17/2021    A/PROM MMT A/PROM MMT  Hip Flexion          Hip Extension          Hip Abduction          Hip Adduction          Hip Internal rotation 50   50    Hip External rotation 30*   50    Knee Flexion          Knee Extension          Ankle Dorsiflexion          Ankle Plantarflexion          Ankle Inversion          Ankle Eversion           (Blank rows = not tested)            * pain     LUMBAR SPECIAL TESTS:  Lumbar distraction - relief noted in back  Ely's test + B Repeated extensions in prone - improved ROM and reduced pain   FUNCTIONAL TESTS:  STS - slow labored movements, needs physical  assist to stand upright- painful Bed mobilities - slow labored movements and Min assist to get up - painful   GAIT: Distance walked: 25 feet Assistive device utilized: None Level of assistance: Modified independence Comments: wide base of support, slow gait, grabs walls to stabilize self, stiff upright gait       TODAY'S TREATMENT  11/08/2021 Therapeutic Exercise:    Seated:seated on exercise ball with UE and PT assist 10 minutes -circles, pelvic tilts and bounces, lumbar flexion x20, lumbar rot 2x12 B, piriformis stretch x3 30" holds B     Standing: hip flexor stretch with glute squeeze 2x15 5" holds, side bending at wall x20 B, shoulder flexion up wall for lumbar extension stretch x25, hip abd 3x10 B, hip flexion 3x10 B, hip exten 3x10 B    Neuromuscular Re-education: Manual  Therapy:  Therapeutic Activity: Self Care: Trigger Point Dry Needling:  Modalities:    PATIENT EDUCATION:  Education details: on  HEP Person educated: Patient Education method: Explanation, Facilities manager, and Handouts Education comprehension: verbalized understanding     HOME EXERCISE PROGRAM: [id number]   ASSESSMENT:   CLINICAL IMPRESSION: 11/08/2021 Continued to progress exercises as tolerated. Patient very limited in hip motions and does not tolerate table exercises secondary to pain. Continued to encourage patient to get into pool to help with decompression and improve ability to tolerate movement/exercise. Slight decrease in pain noted end of session.  Eval: Patient is a 81 y.o. female who was seen today for physical therapy evaluation and treatment for low back pain. Session limited secondary to pain and difficulties getting in and out of different positions. Educated patient on possible benefits of aquatics and decompressing lumbar spine. Educated patient on how to safely get in and out of pool at [facility] with someone with her. Patient would greatly benefit from skilled PT to improve overall function and QOL.     OBJECTIVE IMPAIRMENTS Abnormal gait, decreased activity tolerance, decreased balance, decreased knowledge of use of DME, decreased mobility, difficulty walking, decreased ROM, decreased strength, increased muscle spasms, postural dysfunction, and pain.    ACTIVITY LIMITATIONS bending, sitting, standing, sleeping, bed mobility, toileting, and locomotion level   PARTICIPATION LIMITATIONS: meal prep, cleaning, community activity, and occupation   PERSONAL FACTORS 1-2 comorbidities: low back surgery,  are also affecting patient's functional outcome.    REHAB POTENTIAL: Good   CLINICAL DECISION MAKING: Stable/uncomplicated   EVALUATION COMPLEXITY: Low   GOALS: Goals reviewed with patient?  yes   SHORT TERM GOALS:   Patient will be independent in self management  strategies to improve quality of life and functional outcomes. Baseline: new program Target date: 11/07/2021 Goal status: INITIAL   2.  Patient will report at least 50% improvement in overall symptoms and/or function to demonstrate improved functional mobility Baseline: 0% Target date: 11/07/2021 Goal status: INITIAL   3.  Patient will be able to transition from STS without physical assist or heavy use of UE to improve transitional mobility Baseline: unable Target date: 11/07/2021 Goal status: INITIAL           LONG TERM GOALS:   Patient will report at least 75% improvement in overall symptoms and/or function to demonstrate improved functional mobility Baseline: 0% Target date: 11/28/2021 Goal status: INITIAL   2.  Patient will be able to get up from a chair and use the restroom without having increase in pain. Baseline: painful Target date: 11/28/2021 Goal status: INITIAL   3.  Patient will be able to sit, stand and lay down  without severe pain to improve tolerance to static positions. Baseline: painful Target date: 11/28/2021 Goal status: INITIAL       PLAN: PT FREQUENCY: 2x/week   PT DURATION: 6 weeks   PLANNED INTERVENTIONS: Therapeutic exercises, Therapeutic activity, Neuromuscular re-education, Balance training, Gait training, Patient/Family education, Joint manipulation, Joint mobilization, Aquatic Therapy, Dry Needling, Electrical stimulation, Spinal manipulation, Spinal mobilization, Cryotherapy, Moist heat, Ionotophoresis 4mg /ml Dexamethasone, Manual therapy, and Re-evaluation.   PLAN FOR NEXT SESSION:  , isometrics, ROM,  prone exercises/ ext based exercises-standing exercises     3:10 PM, 11/08/21 11/10/21, DPT Physical Therapy with [facility]

## 2021-11-14 ENCOUNTER — Ambulatory Visit (INDEPENDENT_AMBULATORY_CARE_PROVIDER_SITE_OTHER): Payer: Medicare HMO | Admitting: Physical Therapy

## 2021-11-14 ENCOUNTER — Encounter: Payer: Self-pay | Admitting: Physical Therapy

## 2021-11-14 DIAGNOSIS — M5459 Other low back pain: Secondary | ICD-10-CM | POA: Diagnosis not present

## 2021-11-14 DIAGNOSIS — M6281 Muscle weakness (generalized): Secondary | ICD-10-CM | POA: Diagnosis not present

## 2021-11-14 DIAGNOSIS — D649 Anemia, unspecified: Secondary | ICD-10-CM | POA: Diagnosis not present

## 2021-11-14 NOTE — Therapy (Signed)
OUTPATIENT PHYSICAL THERAPY TREATMENT NOTE   Patient Name: Bianca Wolf MRN: [id number] DOB:11-23-1940, 81 y.o., female Today's Date: 11/14/2021  END OF SESSION:   PT End of Session - 11/14/21 1516     Visit Number 5    Number of Visits 12    Date for PT Re-Evaluation 11/28/21    Authorization Type humana- auth approved from 10/17/21 to 12/12/21    Authorization - Visit Number 5    Authorization - Number of Visits 12    Progress Note Due on Visit 10    PT Start Time 1519    PT Stop Time 1557    PT Time Calculation (min) 38 min    Activity Tolerance Patient limited by pain             Past Medical History:  Diagnosis Date   Hypertension    Past Surgical History:  Procedure Laterality Date   IR KYPHO LUMBAR INC FX REDUCE BONE BX UNI/BIL CANNULATION INC/IMAGING  09/21/2021   Patient Active Problem List   Diagnosis Date Noted   Age-related osteoporosis without current pathological fracture 09/08/2021   Allergic rhinitis 09/08/2021   Essential hypertension 09/08/2021   Hypertriglyceridemia 09/08/2021   Gastroesophageal reflux disease 09/08/2021   Increased frequency of urination 09/08/2021   Overactive bladder 09/08/2021   Hyperthyroidism 09/08/2021   Unspecified asthma, uncomplicated 09/08/2021   Vitamin D deficiency 09/08/2021   History of vertebral compression fracture 09/07/2021      PCP: Merri Brunette, MD   REFERRING PROVIDER:  Rodolph Bong, MD   REFERRING DIAG: [phone] (ICD-10-CM) - Facet arthritis of lumbosacral region M54.50,G89.29 (ICD-10-CM) - Chronic bilateral low back pain without sciatica   Rationale for Evaluation and Treatment Rehabilitation   THERAPY DIAG:  Other low back pain   Muscle weakness (generalized)   ONSET DATE: 6   SUBJECTIVE:                                                                                                                                                                                            SUBJECTIVE  STATEMENT: 11/14/2021 States that she hasn't done the exercises but she has been traveling. States her wound is not covered but its looking better.   Eval: States that the more she sits or lays the more it hurts to get back up. States that she had one shot and it didn't do anything. States that she has been on pain meds but it hasn't done anything. States that she is aching and she can't lay long. States she was working at [employer] and the pain came on and then it  went away and then it came back. States she is no longer working. States she wants to go back to work but doesn't know if she can. Sitting in hard bottom chair is more comfortable than a soft chair   PERTINENT HISTORY:  L5 compression L3 kyphoplasty on 09/21/21 R ESI L4-5 10/03/21 20 years ago lumbar surgery no hardware PAIN:  Are you having pain? Yes: NPRS scale: 7/10 Pain location: low back and tail bone  Pain description: aching, constant  Aggravating factors: laying, sitting Relieving factors: movement     PRECAUTIONS: None   WEIGHT BEARING RESTRICTIONS No   FALLS:  Has patient fallen in last 6 months? Yes. Number of falls 1   LIVING ENVIRONMENT: Lives with: lives with their family No AD equipment   OCCUPATION: works at [employer] day 8 hrs a day 4 days a week   PLOF: Independent   PATIENT GOALS to have less pain     OBJECTIVE:    DIAGNOSTIC FINDINGS:  09/28/21 MRI IMPRESSION: 1. Status post L3 kyphoplasty. Progressive edema in the vertebral body and bilateral pedicles with possible slight interval increase in height loss (approximately 20-30%). 2. 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     SCREENING FOR RED FLAGS: Bowel or bladder incontinence: No Spinal tumors: No Cauda equina syndrome: No Compression fracture: No Abdominal aneurysm: No   COGNITION:           Overall cognitive status: Within functional  limits for tasks assessed                          SENSATION: WFL       POSTURE: rounded shoulders, forward head, increased thoracic kyphosis, and flexed trunk    PALPATION: Tenderness to palpation along B lumbar paraspinals, QL and glutes    LUMBAR ROM: - not tested fall risk and too much pain   Active  A/PROM  eval  Flexion    Extension    Right lateral flexion    Left lateral flexion    Right rotation    Left rotation     (Blank rows = not tested)                  LE Measurements       Lower Extremity Right 10/17/2021 Left 10/17/2021    A/PROM MMT A/PROM MMT  Hip Flexion          Hip Extension          Hip Abduction          Hip Adduction          Hip Internal rotation 50   50    Hip External rotation 30*   50    Knee Flexion          Knee Extension          Ankle Dorsiflexion          Ankle Plantarflexion          Ankle Inversion          Ankle Eversion           (Blank rows = not tested)            * pain     LUMBAR SPECIAL TESTS:  Lumbar distraction - relief noted in back  Ely's test + B Repeated extensions in prone - improved ROM and reduced pain  FUNCTIONAL TESTS:  STS - slow labored movements, needs physical assist to stand upright- painful Bed mobilities - slow labored movements and Min assist to get up - painful   GAIT: Distance walked: 25 feet Assistive device utilized: None Level of assistance: Modified independence Comments: wide base of support, slow gait, grabs walls to stabilize self, stiff upright gait       TODAY'S TREATMENT  11/14/2021 Therapeutic Exercise:    Seated:lumbar flexion with ball 3 minutes, lumbar flexion with side bending 4 minutes, hamstring stretch x3 30" holds, piriformis stretch x3 30" holds B, lumbar rotation x20 5" holds B     Standing: hip flexor stretch with glute squeeze 2x15 5" holds, side bending at wall x20 B, shoulder flexion up wall for lumbar extension stretch x25   Neuromuscular  Re-education: Manual Therapy:  Therapeutic Activity: Self Care: Trigger Point Dry Needling:  Modalities:    PATIENT EDUCATION:  Education details: on  HEP and importance of adherence to anticipate change Person educated: Patient Education method: Explanation, Demonstration, and Handouts Education comprehension: verbalized understanding     HOME EXERCISE PROGRAM: [email or web address]   ASSESSMENT:   CLINICAL IMPRESSION: 11/14/2021 Continued to progress ROM. Still difficulty tolerating supine so stayed with seated and standing exercises. Improved motion and ease of motion noted with all movements on this day. Continues to have poor adherence to HEP, encouraged patient to perform exercises at least once between now and next session. Will continue with current POC as tolerated.   Eval: Patient is a 81 y.o. female who was seen today for physical therapy evaluation and treatment for low back pain. Session limited secondary to pain and difficulties getting in and out of different positions. Educated patient on possible benefits of aquatics and decompressing lumbar spine. Educated patient on how to safely get in and out of pool at [facility] with someone with her. Patient would greatly benefit from skilled PT to improve overall function and QOL.     OBJECTIVE IMPAIRMENTS Abnormal gait, decreased activity tolerance, decreased balance, decreased knowledge of use of DME, decreased mobility, difficulty walking, decreased ROM, decreased strength, increased muscle spasms, postural dysfunction, and pain.    ACTIVITY LIMITATIONS bending, sitting, standing, sleeping, bed mobility, toileting, and locomotion level   PARTICIPATION LIMITATIONS: meal prep, cleaning, community activity, and occupation   PERSONAL FACTORS 1-2 comorbidities: low back surgery,  are also affecting patient's functional outcome.    REHAB POTENTIAL: Good   CLINICAL DECISION MAKING: Stable/uncomplicated   EVALUATION COMPLEXITY: Low    GOALS: Goals reviewed with patient?  yes   SHORT TERM GOALS:   Patient will be independent in self management strategies to improve quality of life and functional outcomes. Baseline: new program Target date: 11/07/2021 Goal status: INITIAL   2.  Patient will report at least 50% improvement in overall symptoms and/or function to demonstrate improved functional mobility Baseline: 0% Target date: 11/07/2021 Goal status: INITIAL   3.  Patient will be able to transition from STS without physical assist or heavy use of UE to improve transitional mobility Baseline: unable Target date: 11/07/2021 Goal status: INITIAL           LONG TERM GOALS:   Patient will report at least 75% improvement in overall symptoms and/or function to demonstrate improved functional mobility Baseline: 0% Target date: 11/28/2021 Goal status: INITIAL   2.  Patient will be able to get up from a chair and use the restroom without having increase in pain. Baseline: painful Target date: 11/28/2021 Goal status: INITIAL  3.  Patient will be able to sit, stand and lay down without severe pain to improve tolerance to static positions. Baseline: painful Target date: 11/28/2021 Goal status: INITIAL       PLAN: PT FREQUENCY: 2x/week   PT DURATION: 6 weeks   PLANNED INTERVENTIONS: Therapeutic exercises, Therapeutic activity, Neuromuscular re-education, Balance training, Gait training, Patient/Family education, Joint manipulation, Joint mobilization, Aquatic Therapy, Dry Needling, Electrical stimulation, Spinal manipulation, Spinal mobilization, Cryotherapy, Moist heat, Ionotophoresis 4mg /ml Dexamethasone, Manual therapy, and Re-evaluation.   PLAN FOR NEXT SESSION:  , isometrics, ROM,  prone exercises/ ext based exercises-standing exercises     3:58 PM, 11/14/21 11/16/21, DPT Physical Therapy with [facility]

## 2021-11-16 ENCOUNTER — Encounter: Payer: Self-pay | Admitting: Physical Therapy

## 2021-11-16 ENCOUNTER — Ambulatory Visit: Payer: Medicare HMO | Admitting: Physical Therapy

## 2021-11-16 DIAGNOSIS — M6281 Muscle weakness (generalized): Secondary | ICD-10-CM

## 2021-11-16 DIAGNOSIS — M5459 Other low back pain: Secondary | ICD-10-CM

## 2021-11-16 NOTE — Therapy (Signed)
OUTPATIENT PHYSICAL THERAPY TREATMENT NOTE   Patient Name: Bianca Wolf MRN: [id number] DOB:1941/02/22, 81 y.o., female Today's Date: 11/16/2021  END OF SESSION:   PT End of Session - 11/16/21 1519     Visit Number 6    Number of Visits 12    Date for PT Re-Evaluation 11/28/21    Authorization Type humana- auth approved from 10/17/21 to 12/12/21    Authorization - Visit Number 6    Authorization - Number of Visits 12    Progress Note Due on Visit 10    PT Start Time 1518    PT Stop Time 1556    PT Time Calculation (min) 38 min    Activity Tolerance Patient limited by pain             Past Medical History:  Diagnosis Date   Hypertension    Past Surgical History:  Procedure Laterality Date   IR KYPHO LUMBAR INC FX REDUCE BONE BX UNI/BIL CANNULATION INC/IMAGING  09/21/2021   Patient Active Problem List   Diagnosis Date Noted   Age-related osteoporosis without current pathological fracture 09/08/2021   Allergic rhinitis 09/08/2021   Essential hypertension 09/08/2021   Hypertriglyceridemia 09/08/2021   Gastroesophageal reflux disease 09/08/2021   Increased frequency of urination 09/08/2021   Overactive bladder 09/08/2021   Hyperthyroidism 09/08/2021   Unspecified asthma, uncomplicated 09/08/2021   Vitamin D deficiency 09/08/2021   History of vertebral compression fracture 09/07/2021      PCP: Merri Brunette, MD   REFERRING PROVIDER:  Rodolph Bong, MD   REFERRING DIAG: [phone] (ICD-10-CM) - Facet arthritis of lumbosacral region M54.50,G89.29 (ICD-10-CM) - Chronic bilateral low back pain without sciatica   Rationale for Evaluation and Treatment Rehabilitation   THERAPY DIAG:  Other low back pain   Muscle weakness (generalized)   ONSET DATE: 6   SUBJECTIVE:                                                                                                                                                                                            SUBJECTIVE  STATEMENT: 11/16/2021 States she was sore after last session. States she hasn't done her exercise. States she has gone to the pool once a week.   Eval: States that the more she sits or lays the more it hurts to get back up. States that she had one shot and it didn't do anything. States that she has been on pain meds but it hasn't done anything. States that she is aching and she can't lay long. States she was working at [employer] and the pain came on and then  it went away and then it came back. States she is no longer working. States she wants to go back to work but doesn't know if she can. Sitting in hard bottom chair is more comfortable than a soft chair   PERTINENT HISTORY:  L5 compression L3 kyphoplasty on 09/21/21 R ESI L4-5 10/03/21 20 years ago lumbar surgery no hardware PAIN:  Are you having pain? Yes: NPRS scale: 7/10 Pain location: low back and tail bone  Pain description: aching, constant  Aggravating factors: laying, sitting Relieving factors: movement     PRECAUTIONS: None   WEIGHT BEARING RESTRICTIONS No   FALLS:  Has patient fallen in last 6 months? Yes. Number of falls 1   LIVING ENVIRONMENT: Lives with: lives with their family No AD equipment   OCCUPATION: works at [employer] day 8 hrs a day 4 days a week   PLOF: Independent   PATIENT GOALS to have less pain     OBJECTIVE:    DIAGNOSTIC FINDINGS:  09/28/21 MRI IMPRESSION: 1. Status post L3 kyphoplasty. Progressive edema in the vertebral body and bilateral pedicles with possible slight interval increase in height loss (approximately 20-30%). 2. 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     SCREENING FOR RED FLAGS: Bowel or bladder incontinence: No Spinal tumors: No Cauda equina syndrome: No Compression fracture: No Abdominal aneurysm: No   COGNITION:           Overall cognitive status: Within functional  limits for tasks assessed                          SENSATION: WFL       POSTURE: rounded shoulders, forward head, increased thoracic kyphosis, and flexed trunk    PALPATION: Tenderness to palpation along B lumbar paraspinals, QL and glutes    LUMBAR ROM: - not tested fall risk and too much pain   Active  A/PROM  eval  Flexion    Extension    Right lateral flexion    Left lateral flexion    Right rotation    Left rotation     (Blank rows = not tested)                  LE Measurements       Lower Extremity Right 10/17/2021 Left 10/17/2021    A/PROM MMT A/PROM MMT  Hip Flexion          Hip Extension          Hip Abduction          Hip Adduction          Hip Internal rotation 50   50    Hip External rotation 30*   50    Knee Flexion          Knee Extension          Ankle Dorsiflexion          Ankle Plantarflexion          Ankle Inversion          Ankle Eversion           (Blank rows = not tested)            * pain     LUMBAR SPECIAL TESTS:  Lumbar distraction - relief noted in back  Ely's test + B Repeated extensions in prone - improved ROM and reduced pain  FUNCTIONAL TESTS:  STS - slow labored movements, needs physical assist to stand upright- painful Bed mobilities - slow labored movements and Min assist to get up - painful   GAIT: Distance walked: 25 feet Assistive device utilized: None Level of assistance: Modified independence Comments: wide base of support, slow gait, grabs walls to stabilize self, stiff upright gait       TODAY'S TREATMENT  11/16/2021 Therapeutic Exercise:    Seated:lumbar flexion   minutes,hip IR 3x10 B, piriformis stretch x3 30" holds B, lumbar rotation x20 5" holds B     Standing: hip flexor stretch with glute squeeze 2 1 minute each B 5" holds,  lumbar extension facing wall 5 minutes, lumbar traction at counter going into L stretch- 5 minute - PT assistance side bending at wall x20 B, shoulder flexion up wall for lumbar  extension stretch x25   Neuromuscular Re-education: Manual Therapy: long axis traction of legs in recliner position 12 minutes total  Therapeutic Activity: Self Care: Trigger Point Dry Needling:  Modalities:    PATIENT EDUCATION:  Education details: on  HEP  Person educated: Patient Education method: Explanation, Facilities manager, and Handouts Education comprehension: verbalized understanding     HOME EXERCISE PROGRAM: [id number]   ASSESSMENT:   CLINICAL IMPRESSION: 11/16/2021 Tolerated recliner position today and long axis traction well but no reduced symptoms noted. Patient with limited hip extension and IR that is likely contributing to current pain and gait difficulties. Will continue to trial supine/recliner/side lying position for additional manual interventions as tolerated.   Eval: Patient is a 81 y.o. female who was seen today for physical therapy evaluation and treatment for low back pain. Session limited secondary to pain and difficulties getting in and out of different positions. Educated patient on possible benefits of aquatics and decompressing lumbar spine. Educated patient on how to safely get in and out of pool at [facility] with someone with her. Patient would greatly benefit from skilled PT to improve overall function and QOL.     OBJECTIVE IMPAIRMENTS Abnormal gait, decreased activity tolerance, decreased balance, decreased knowledge of use of DME, decreased mobility, difficulty walking, decreased ROM, decreased strength, increased muscle spasms, postural dysfunction, and pain.    ACTIVITY LIMITATIONS bending, sitting, standing, sleeping, bed mobility, toileting, and locomotion level   PARTICIPATION LIMITATIONS: meal prep, cleaning, community activity, and occupation   PERSONAL FACTORS 1-2 comorbidities: low back surgery,  are also affecting patient's functional outcome.    REHAB POTENTIAL: Good   CLINICAL DECISION MAKING: Stable/uncomplicated   EVALUATION COMPLEXITY:  Low   GOALS: Goals reviewed with patient?  yes   SHORT TERM GOALS:   Patient will be independent in self management strategies to improve quality of life and functional outcomes. Baseline: new program Target date: 11/07/2021 Goal status: INITIAL   2.  Patient will report at least 50% improvement in overall symptoms and/or function to demonstrate improved functional mobility Baseline: 0% Target date: 11/07/2021 Goal status: INITIAL   3.  Patient will be able to transition from STS without physical assist or heavy use of UE to improve transitional mobility Baseline: unable Target date: 11/07/2021 Goal status: INITIAL           LONG TERM GOALS:   Patient will report at least 75% improvement in overall symptoms and/or function to demonstrate improved functional mobility Baseline: 0% Target date: 11/28/2021 Goal status: INITIAL   2.  Patient will be able to get up from a chair and use the restroom without having increase in pain.  Baseline: painful Target date: 11/28/2021 Goal status: INITIAL   3.  Patient will be able to sit, stand and lay down without severe pain to improve tolerance to static positions. Baseline: painful Target date: 11/28/2021 Goal status: INITIAL       PLAN: PT FREQUENCY: 2x/week   PT DURATION: 6 weeks   PLANNED INTERVENTIONS: Therapeutic exercises, Therapeutic activity, Neuromuscular re-education, Balance training, Gait training, Patient/Family education, Joint manipulation, Joint mobilization, Aquatic Therapy, Dry Needling, Electrical stimulation, Spinal manipulation, Spinal mobilization, Cryotherapy, Moist heat, Ionotophoresis 4mg /ml Dexamethasone, Manual therapy, and Re-evaluation.   PLAN FOR NEXT SESSION:  , isometrics, ROM,  prone exercises/ ext based exercises-standing exercises     3:59 PM, 11/16/21 11/18/21, DPT Physical Therapy with [facility]

## 2021-11-20 ENCOUNTER — Encounter: Payer: Self-pay | Admitting: Physical Therapy

## 2021-11-20 ENCOUNTER — Ambulatory Visit (INDEPENDENT_AMBULATORY_CARE_PROVIDER_SITE_OTHER): Payer: Medicare HMO | Admitting: Physical Therapy

## 2021-11-20 DIAGNOSIS — M5459 Other low back pain: Secondary | ICD-10-CM

## 2021-11-20 DIAGNOSIS — M6281 Muscle weakness (generalized): Secondary | ICD-10-CM

## 2021-11-20 NOTE — Therapy (Signed)
OUTPATIENT PHYSICAL THERAPY TREATMENT NOTE   Patient Name: Bianca Wolf MRN: [id number] DOB:10/16/1940, 81 y.o., female Today's Date: 11/20/2021  END OF SESSION:   PT End of Session - 11/20/21 1520     Visit Number 7    Number of Visits 12    Date for PT Re-Evaluation 11/28/21    Authorization Type humana- auth approved from 10/17/21 to 12/12/21    Authorization - Visit Number 7    Authorization - Number of Visits 12    Progress Note Due on Visit 10    PT Start Time 1520    PT Stop Time 1551    PT Time Calculation (min) 31 min    Activity Tolerance Patient limited by pain             Past Medical History:  Diagnosis Date   Hypertension    Past Surgical History:  Procedure Laterality Date   IR KYPHO LUMBAR INC FX REDUCE BONE BX UNI/BIL CANNULATION INC/IMAGING  09/21/2021   Patient Active Problem List   Diagnosis Date Noted   Age-related osteoporosis without current pathological fracture 09/08/2021   Allergic rhinitis 09/08/2021   Essential hypertension 09/08/2021   Hypertriglyceridemia 09/08/2021   Gastroesophageal reflux disease 09/08/2021   Increased frequency of urination 09/08/2021   Overactive bladder 09/08/2021   Hyperthyroidism 09/08/2021   Unspecified asthma, uncomplicated 76/80/8811   Vitamin D deficiency 09/08/2021   History of vertebral compression fracture 09/07/2021      PCP: [name], MD   REFERRING PROVIDER:  Gregor Hams, MD   REFERRING DIAG: [phone] (ICD-10-CM) - Facet arthritis of lumbosacral region M54.50,G89.29 (ICD-10-CM) - Chronic bilateral low back pain without sciatica   Rationale for Evaluation and Treatment Rehabilitation   THERAPY DIAG:  Other low back pain   Muscle weakness (generalized)   ONSET DATE: 6   SUBJECTIVE:                                                                                                                                                                                            SUBJECTIVE  STATEMENT: 11/20/2021 States she feels like she hurts more after therapy. States she has been up and doing things and is hurting a bit today. States that she doesn't know what to do.  Eval: States that the more she sits or lays the more it hurts to get back up. States that she had one shot and it didn't do anything. States that she has been on pain meds but it hasn't done anything. States that she is aching and she can't lay long. States she was working at a [employer]  and the pain came on and then it went away and then it came back. States she is no longer working. States she wants to go back to work but doesn't know if she can. Sitting in hard bottom chair is more comfortable than a soft chair   PERTINENT HISTORY:  L5 compression L3 kyphoplasty on 09/21/21 R ESI L4-5 10/03/21 20 years ago lumbar surgery no hardware PAIN:  Are you having pain? Yes: NPRS scale: 7/10 Pain location: low back and tail bone  Pain description: aching, constant  Aggravating factors: laying, sitting Relieving factors: movement     PRECAUTIONS: None   WEIGHT BEARING RESTRICTIONS No   FALLS:  Has patient fallen in last 6 months? Yes. Number of falls 1   LIVING ENVIRONMENT: Lives with: lives with their family No AD equipment   OCCUPATION: works at [employer] day 8 hrs a day 4 days a week   PLOF: Independent   PATIENT GOALS to have less pain     OBJECTIVE:    DIAGNOSTIC FINDINGS:  09/28/21 MRI IMPRESSION: 1. Status post L3 kyphoplasty. Progressive edema in the vertebral body and bilateral pedicles with possible slight interval increase in height loss (approximately 20-30%). 2. 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     SCREENING FOR RED FLAGS: Bowel or bladder incontinence: No Spinal tumors: No Cauda equina syndrome: No Compression fracture: No Abdominal aneurysm: No   COGNITION:           Overall  cognitive status: Within functional limits for tasks assessed                          SENSATION: WFL       POSTURE: rounded shoulders, forward head, increased thoracic kyphosis, and flexed trunk    PALPATION: Tenderness to palpation along B lumbar paraspinals, QL and glutes    LUMBAR ROM: - not tested fall risk and too much pain   Active  A/PROM  eval  Flexion    Extension    Right lateral flexion    Left lateral flexion    Right rotation    Left rotation     (Blank rows = not tested)                  LE Measurements       Lower Extremity Right 10/17/2021 Left 10/17/2021    A/PROM MMT A/PROM MMT  Hip Flexion          Hip Extension          Hip Abduction          Hip Adduction          Hip Internal rotation 50   50    Hip External rotation 30*   50    Knee Flexion          Knee Extension          Ankle Dorsiflexion          Ankle Plantarflexion          Ankle Inversion          Ankle Eversion           (Blank rows = not tested)            * pain     LUMBAR SPECIAL TESTS:  Lumbar distraction - relief noted in back  Ely's test + B Repeated extensions in prone -  improved ROM and reduced pain   FUNCTIONAL TESTS:  STS - slow labored movements, needs physical assist to stand upright- painful Bed mobilities - slow labored movements and Min assist to get up - painful   GAIT: Distance walked: 25 feet Assistive device utilized: None Level of assistance: Modified independence Comments: wide base of support, slow gait, grabs walls to stabilize self, stiff upright gait       TODAY'S TREATMENT  11/20/2021 Therapeutic Exercise:   Neuromuscular Re-education: Manual Therapy: STM to left QL and lumbar paraspinals Therapeutic Activity: Self Care: Trigger Point Dry Needling:  Modalities:    PATIENT EDUCATION:  Education details: on  HEP, on aquatics, on reducing stress and activities to rest and how to rest, on how to heal and MOI, on current age, stresses and  activities Person educated: Patient Education method: Explanation, Demonstration, and Handouts Education comprehension: verbalized understanding     HOME EXERCISE PROGRAM: [id number]   ASSESSMENT:   CLINICAL IMPRESSION: 11/20/2021 Session focused on education. Patient with poor tolerance to interventions with reports of increased pain following sessions. Stressed reducing overall activities as she is standing, sitting and performing activities that are currently too stressful on her body. Stressed getting in the pool daily to float and walk in chest deep water to help decompress lumbar spine. Patient to f/u with MD secondary to lack of progress while in therapy      OBJECTIVE IMPAIRMENTS Abnormal gait, decreased activity tolerance, decreased balance, decreased knowledge of use of DME, decreased mobility, difficulty walking, decreased ROM, decreased strength, increased muscle spasms, postural dysfunction, and pain.    ACTIVITY LIMITATIONS bending, sitting, standing, sleeping, bed mobility, toileting, and locomotion level   PARTICIPATION LIMITATIONS: meal prep, cleaning, community activity, and occupation   PERSONAL FACTORS 1-2 comorbidities: low back surgery,  are also affecting patient's functional outcome.    REHAB POTENTIAL: Good   CLINICAL DECISION MAKING: Stable/uncomplicated   EVALUATION COMPLEXITY: Low   GOALS: Goals reviewed with patient?  yes   SHORT TERM GOALS:   Patient will be independent in self management strategies to improve quality of life and functional outcomes. Baseline: new program Target date: 11/07/2021 Goal status: not met   2.  Patient will report at least 50% improvement in overall symptoms and/or function to demonstrate improved functional mobility Baseline: 0% Target date: 11/07/2021 Goal status: not met 3.  Patient will be able to transition from STS without physical assist or heavy use of UE to improve transitional mobility Baseline:  unable Target date: 11/07/2021 Goal status: not met           LONG TERM GOALS:   Patient will report at least 75% improvement in overall symptoms and/or function to demonstrate improved functional mobility Baseline: 0% Target date: 11/28/2021 Goal status: not met   2.  Patient will be able to get up from a chair and use the restroom without having increase in pain. Baseline: painful Target date: 11/28/2021 Goal status: not met   3.  Patient will be able to sit, stand and lay down without severe pain to improve tolerance to static positions. Baseline: painful Target date: 11/28/2021 Goal status: not met       PLAN: PT FREQUENCY: 2x/week   PT DURATION: 6 weeks   PLANNED INTERVENTIONS: Therapeutic exercises, Therapeutic activity, Neuromuscular re-education, Balance training, Gait training, Patient/Family education, Joint manipulation, Joint mobilization, Aquatic Therapy, Dry Needling, Electrical stimulation, Spinal manipulation, Spinal mobilization, Cryotherapy, Moist heat, Ionotophoresis 4mg /ml Dexamethasone, Manual therapy, and Re-evaluation.   PLAN FOR  NEXT SESSION:  DC to HEP secondary to lack of progress   3:56 PM, 11/20/21 Jerene Pitch, DPT Physical Therapy with [facility]

## 2021-11-22 ENCOUNTER — Encounter: Payer: Self-pay | Admitting: Physical Therapy

## 2021-11-27 ENCOUNTER — Ambulatory Visit: Payer: Medicare HMO | Admitting: Family Medicine

## 2021-11-27 VITALS — BP 180/94 | HR 66 | Ht 64.0 in | Wt 160.8 lb

## 2021-11-27 DIAGNOSIS — M5441 Lumbago with sciatica, right side: Secondary | ICD-10-CM | POA: Diagnosis not present

## 2021-11-27 DIAGNOSIS — M5442 Lumbago with sciatica, left side: Secondary | ICD-10-CM

## 2021-11-27 DIAGNOSIS — M47817 Spondylosis without myelopathy or radiculopathy, lumbosacral region: Secondary | ICD-10-CM | POA: Diagnosis not present

## 2021-11-27 DIAGNOSIS — G8929 Other chronic pain: Secondary | ICD-10-CM | POA: Diagnosis not present

## 2021-11-27 NOTE — Patient Instructions (Addendum)
Thank you for coming in today.   Please call [facility] at [phone] to schedule your spine injection.    Let me know how you feel after the next back injection.

## 2021-11-27 NOTE — Progress Notes (Signed)
I, Philbert Riser, LAT, ATC acting as a scribe for Clementeen Graham, MD.  Bianca Wolf is a 81 y.o. female who presents to [facility] at [facility] today for f/u chronic bilat LBP. Based MRI results, she was referred to IR and had an L3 kyphoplasty on 09/21/21. She then had a R L4-5 ESI on 10/03/21. Pt was last seen by Dr. Denyse Amass on 10/13/21 and facet injections were ordered and later performed on 10/26/21. Today, pt reports low back is still hurting. Pt notes last week, her L leg gave out of her, due to weakness. Pt c/o increased pain after PT, last visit completed on 7/31. Pt reports radiating pain in bilat legs w/ numbness into bilat feet.   Dx imaging: 09/28/21 L-spine MRI  09/12/21 L-spine MRI  Pertinent review of systems: Hypertension  Relevant historical information: Vertebral compression fracture.   Exam:  BP (!) 180/94   Pulse 66   Ht 5\' 4"  (1.626 m)   Wt 160 lb 12.8 oz (72.9 kg)   SpO2 97%   BMI 27.60 kg/m  General: Well Developed, well nourished, and in no acute distress.   MSK: L-spine: Nontender midline.  Mildly tender palpation right lumbar paraspinal musculature. Normal lumbar motion. Lower extremity strength is intact.    Lab and Radiology Results  EXAM: MRI LUMBAR SPINE WITHOUT CONTRAST   TECHNIQUE: Multiplanar, multisequence MR imaging of the lumbar spine was performed. No intravenous contrast was administered.   COMPARISON:  MRI of the lumbar spine Sep 12, 2021   FINDINGS: Segmentation: Standard segmentation is assumed. The inferior-most fully formed intervertebral disc is maintained.   Alignment:  Grade 1 anterolisthesis of L4 on L5, similar.   Vertebrae: Status post L3 kyphoplasty. Progressive edema within the vertebral body and bilateral pedicles with possible slight interval increase in height loss (approximately 20-30%). Mild edema along the inferior L2 endplate, probably degenerative/discogenic in etiology. Chronic L4 compression  fracture, similar.   Conus medullaris and cauda equina: Conus extends to the T12-L1 level. Conus appears normal.   Paraspinal and other soft tissues: Unremarkable.   Disc levels:   T12-L1: No significant disc protrusion, foraminal stenosis, or canal stenosis.   L1-L2: No significant disc protrusion, foraminal stenosis, or canal stenosis.   L2-L3: Broad disc bulging with mild right subarticular recess stenosis. No significant canal foraminal stenosis.   L3-L4: Broad disc bulge. Bilateral facet arthropathy. Resulting moderate canal stenosis and mild right and moderate left foraminal stenosis, similar.   L4-L5: Similar grade 1 anterolisthesis. Uncovering the disc with superimposed disc bulging. Ligamentum flavum thickening and severe bilateral facet arthropathy. Resulting severe canal stenosis, similar. Moderate right and mild left foraminal stenosis, similar.   L5-S1: Prior right laminectomy. Disc bulge and endplate spurring. Facet arthropathy. Mild right subarticular recess stenosis, similar. Patent canal and foramina.   IMPRESSION: 1. Status post L3 kyphoplasty. Progressive edema in the vertebral body and bilateral pedicles with possible slight interval increase in height loss (approximately 20-30%). 2. 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     Electronically Signed   By: Sep 14, 2021 M.D.   On: 09/29/2021 12:10 I, 11/29/2021, personally (independently) visualized and performed the interpretation of the images attached in this note.      Assessment and Plan: 81 y.o. female with low back pain mostly right-sided axial back pain with numbness radiating down her legs bilaterally. She is already had a considerable amount of treatment for this  including an epidural steroid injection on June 13 (right L4-L5 interlaminar) which did not help much and facet injections right L3-4 and L4-5 on  July 6 which did not help very much.  At this point is tough to tell where her pain is being generated.  She still has L5-S1 facet joint as a source of pain that has not been addressed with injection yet.  Additionally L3-4 spinal canal stenosis could be a source of the radiculopathy has not been a injected yet.  Her biggest issue today is her back pain.  We will try the facet injection at right L5-S1.  If this does not help her well enough we will try epidural steroid injection at L3-4.  She will keep me updated with how she feels.   PDMP not reviewed this encounter. Orders Placed This Encounter  Procedures   DG FACET JT INJ L /S SINGLE LEVEL RIGHT W/FL/CT    FACETS LUMB RT L5-S1 [employer]NA 160 LBS  PACS (09/28/21) NO ASA NO THINS/OTC NO NEEDS PT AWARE NO SHOW FEE    Standing Status:   Future    Standing Expiration Date:   11/28/2022    Order Specific Question:   Reason for Exam (SYMPTOM  OR DIAGNOSIS REQUIRED)    Answer:   Rt L5-S1    Order Specific Question:   Preferred Imaging Location?    Answer:   [id number]    Order Specific Question:   Radiology Contrast Protocol - do NOT remove file path    Answer:   [email or web address]   No orders of the defined types were placed in this encounter.    Discussed warning signs or symptoms. Please see discharge instructions. Patient expresses understanding.   The above documentation has been reviewed and is accurate and complete Clementeen Graham, M.D.  Total encounter time 30 minutes including face-to-face time with the patient and, reviewing past medical record, and charting on the date of service.   Extensive discussion and review of past treatment and current or future treatment options.

## 2021-11-29 ENCOUNTER — Ambulatory Visit
Admission: RE | Admit: 2021-11-29 | Discharge: 2021-11-29 | Disposition: A | Discharge status: Home / Self Care | Payer: Medicare HMO | Source: Ambulatory Visit | Attending: Family Medicine | Admitting: Family Medicine

## 2021-11-29 DIAGNOSIS — M47817 Spondylosis without myelopathy or radiculopathy, lumbosacral region: Secondary | ICD-10-CM

## 2021-11-29 DIAGNOSIS — M4696 Unspecified inflammatory spondylopathy, lumbar region: Secondary | ICD-10-CM | POA: Diagnosis not present

## 2021-11-29 DIAGNOSIS — G8929 Other chronic pain: Secondary | ICD-10-CM

## 2021-11-29 MED ORDER — IOPAMIDOL (ISOVUE-M 200) INJECTION 41%
1.0000 mL | Freq: Once | INTRAMUSCULAR | Status: AC
  Administered 2021-11-29: 1 mL via INTRA_ARTICULAR

## 2021-11-29 MED ORDER — METHYLPREDNISOLONE ACETATE 40 MG/ML INJ SUSP (RADIOLOG
80.0000 mg | Freq: Once | INTRAMUSCULAR | Status: AC
  Administered 2021-11-29: 80 mg via INTRA_ARTICULAR

## 2021-11-29 NOTE — Discharge Instructions (Signed)

## 2021-12-04 DIAGNOSIS — D649 Anemia, unspecified: Secondary | ICD-10-CM | POA: Diagnosis not present

## 2021-12-13 ENCOUNTER — Telehealth: Payer: Self-pay | Admitting: Family Medicine

## 2021-12-13 ENCOUNTER — Encounter: Payer: Self-pay | Admitting: Family Medicine

## 2021-12-13 NOTE — Telephone Encounter (Signed)
Letter written and printed and taken to the front desk for either mail or pickup.

## 2021-12-13 NOTE — Telephone Encounter (Signed)
Letter faxed and up front for pt pickup, pt informed via VM.

## 2021-12-13 NOTE — Telephone Encounter (Signed)
Pt needs a note to be OOW another 30 days. Tentative return date 01/19/2022.  Fax to [phone] and call pt to p/u copy.

## 2021-12-21 ENCOUNTER — Ambulatory Visit: Payer: Medicare HMO | Admitting: Family Medicine

## 2021-12-21 VITALS — BP 142/86 | HR 51 | Ht 64.0 in | Wt 161.8 lb

## 2021-12-21 DIAGNOSIS — M47817 Spondylosis without myelopathy or radiculopathy, lumbosacral region: Secondary | ICD-10-CM | POA: Diagnosis not present

## 2021-12-21 DIAGNOSIS — G8929 Other chronic pain: Secondary | ICD-10-CM | POA: Diagnosis not present

## 2021-12-21 DIAGNOSIS — M5441 Lumbago with sciatica, right side: Secondary | ICD-10-CM | POA: Diagnosis not present

## 2021-12-21 DIAGNOSIS — M5442 Lumbago with sciatica, left side: Secondary | ICD-10-CM

## 2021-12-21 NOTE — Progress Notes (Signed)
I, Bianca Wolf, LAT, ATC acting as a scribe for Bianca Graham, MD.  Bianca Wolf is a 81 y.o. female who presents to [facility] at [facility] today for cont'd chronic LBP w/ spinal stenosis and lumbar facet arthritis. Pt was last seen by Dr. Denyse Wolf on 11/27/21 and a facet injection was ordered at L5-S1 and was later performed on 11/29/21. Pt had prior facet injections on 10/26/21 at L3-4 and L4-5. Today, pt reports relief from Bianca Wolf only lasted for about 5 days and it's slowly cont to worsen. Pt is really wanting to get back to work, but doesn't quite feel about to yet.  She anticipates returning to work on 29 September.  Dx imaging: 09/28/21 L-spine MRI             09/12/21 L-spine MRI  Pertinent review of systems: No fevers or chills  Relevant historical information: Hypertension   Exam:  BP (!) 142/86   Pulse (!) 51   Ht 5\' 4"  (1.626 m)   Wt 161 lb 12.8 oz (73.4 kg)   SpO2 92%   BMI 27.77 kg/m  General: Well Developed, well nourished, and in no acute distress.   MSK: L-spine: Nontender midline. Decreased lumbar motion. Lower extremity strength is intact.    Lab and Radiology Results  EXAM: MRI LUMBAR SPINE WITHOUT CONTRAST   TECHNIQUE: Multiplanar, multisequence MR imaging of the lumbar spine was performed. No intravenous contrast was administered.   COMPARISON:  MRI of the lumbar spine Bianca 23, Wolf   FINDINGS: Segmentation: Standard segmentation is assumed. The inferior-most fully formed intervertebral disc is maintained.   Alignment:  Grade 1 anterolisthesis of L4 on L5, similar.   Vertebrae: Status post L3 kyphoplasty. Progressive edema within the vertebral body and bilateral pedicles with possible slight interval increase in height loss (approximately 20-30%). Mild edema along the inferior L2 endplate, probably degenerative/discogenic in etiology. Chronic L4 compression fracture, similar.   Conus medullaris and cauda equina: Conus extends to  the T12-L1 level. Conus appears normal.   Paraspinal and other soft tissues: Unremarkable.   Disc levels:   T12-L1: No significant disc protrusion, foraminal stenosis, or canal stenosis.   L1-L2: No significant disc protrusion, foraminal stenosis, or canal stenosis.   L2-L3: Broad disc bulging with mild right subarticular recess stenosis. No significant canal foraminal stenosis.   L3-L4: Broad disc bulge. Bilateral facet arthropathy. Resulting moderate canal stenosis and mild right and moderate left foraminal stenosis, similar.   L4-L5: Similar grade 1 anterolisthesis. Uncovering the disc with superimposed disc bulging. Ligamentum flavum thickening and severe bilateral facet arthropathy. Resulting severe canal stenosis, similar. Moderate right and mild left foraminal stenosis, similar.   L5-S1: Prior right laminectomy. Disc bulge and endplate spurring. Facet arthropathy. Mild right subarticular recess stenosis, similar. Patent canal and foramina.   IMPRESSION: 1. Status post L3 kyphoplasty. Progressive edema in the vertebral body and bilateral pedicles with possible slight interval increase in height loss (approximately 20-30%). 2. 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     Electronically Signed   By: Bianca Wolf M.D.   On: 06/09/Wolf 12:10  CLINICAL DATA:  Right low back pain. Multilevel facet arthropathy. Patient do not have significant relief of pain with right-sided facet injections at L3-4 and L4-5.   Right L5-S1 facet injection requested.   FLUOROSCOPY: Radiation Exposure Index (as provided by the fluoroscopic device): Dose area product 30.78 uGy*m2   PROCEDURE: DG FACET  JT INJ L OR S SPINE SINGLE LEVEL UNILATERAL   The procedure, risks, benefits, and alternatives were explained to the patient. Questions regarding the procedure were encouraged and answered. The patient  understands and consents to the procedure.   Right L5-S1FACET INJECTION: A posterior oblique approach was taken to the facet on the right at L5-S1 using a curved 22 gauge spinal needle. Intra-articular positioning was confirmed by injecting a small amount of Isovue-M 200. No vascular opacification is seen. 80mg  of Depo-Medrol mixed with 1 mL 1% lidocaine were instilled into the joint. The injection resulted in concordant pain. The procedure was well-tolerated.   IMPRESSION: Technically successful third right L5-S1 facet injection.     Electronically Signed   By: M.D.   On: 08/09/Wolf 12:52  I, 10/09/Wolf, personally (independently) visualized and performed the interpretation of the images attached in this note.     Assessment and Plan: 81 y.o. female with chronic low back pain.  This has been challenging to treat.  She most recently had a facet injection at right L5-S1.  Prior to that she had right L3-4 and L4-5 that worked less well.  The most recent facet injection worked well but temporarily to control pain.  This seems to be her main source of pain at right L5-S1.  Plan for medial branch block and ablation.  Of note she has a planned return to work date on September 29.  We will go ahead and proceed with that based on paperwork today.   PDMP not reviewed this encounter. Orders Placed This Encounter  Procedures   DG Facet Jt Neuro Destruct Sing L/S w/Img Guide    Order Specific Question:   Reason for exam:    Answer:   Need confirmatory medial branch block, and if responsive, radiofrequency ablation of: Rt L5-S1    Order Specific Question:   Preferred imaging location?    Answer:   [facility]   No orders of the defined types were placed in this encounter.    Discussed warning signs or symptoms. Please see discharge instructions. Patient expresses understanding.   The above documentation has been reviewed and is accurate and complete 11-05-1970, M.D.   Total encounter time 30 minutes including face-to-face time with the patient and, reviewing past medical record, and charting on the date of service.   Reviewed of MRI discussed treatment plan and options from here.  Also talked about paresthesia and neuropathy.

## 2021-12-21 NOTE — Patient Instructions (Addendum)
Thank you for coming in today.   Please call [facility] at [phone] to schedule your spine injection.     

## 2021-12-26 ENCOUNTER — Other Ambulatory Visit: Payer: Self-pay | Admitting: Family Medicine

## 2021-12-26 ENCOUNTER — Telehealth: Payer: Self-pay | Admitting: Family Medicine

## 2021-12-26 DIAGNOSIS — M47817 Spondylosis without myelopathy or radiculopathy, lumbosacral region: Secondary | ICD-10-CM

## 2021-12-26 DIAGNOSIS — G8929 Other chronic pain: Secondary | ICD-10-CM

## 2021-12-26 NOTE — Telephone Encounter (Signed)
Pt wanted Dr. Denyse Amass to know that she will not be scheduling the epidural as her back feels so much better.

## 2022-01-02 DIAGNOSIS — D649 Anemia, unspecified: Secondary | ICD-10-CM | POA: Diagnosis not present

## 2022-01-04 DIAGNOSIS — E611 Iron deficiency: Secondary | ICD-10-CM | POA: Diagnosis not present

## 2022-01-19 DIAGNOSIS — E0590 Thyrotoxicosis, unspecified without thyrotoxic crisis or storm: Secondary | ICD-10-CM | POA: Diagnosis not present

## 2022-02-06 ENCOUNTER — Emergency Department (HOSPITAL_COMMUNITY): Payer: Medicare HMO

## 2022-02-06 ENCOUNTER — Other Ambulatory Visit: Payer: Self-pay

## 2022-02-06 ENCOUNTER — Encounter (HOSPITAL_COMMUNITY): Payer: Self-pay

## 2022-02-06 ENCOUNTER — Inpatient Hospital Stay (HOSPITAL_COMMUNITY)
Admission: EM | Admit: 2022-02-06 | Discharge: 2022-02-10 | DRG: 308 | Disposition: A | Discharge status: Home / Self Care | Payer: Medicare HMO | Attending: Family Medicine | Admitting: Family Medicine

## 2022-02-06 DIAGNOSIS — D649 Anemia, unspecified: Secondary | ICD-10-CM | POA: Diagnosis not present

## 2022-02-06 DIAGNOSIS — S32040A Wedge compression fracture of fourth lumbar vertebra, initial encounter for closed fracture: Secondary | ICD-10-CM | POA: Diagnosis not present

## 2022-02-06 DIAGNOSIS — K219 Gastro-esophageal reflux disease without esophagitis: Secondary | ICD-10-CM | POA: Diagnosis not present

## 2022-02-06 DIAGNOSIS — M8000XA Age-related osteoporosis with current pathological fracture, unspecified site, initial encounter for fracture: Secondary | ICD-10-CM | POA: Diagnosis not present

## 2022-02-06 DIAGNOSIS — I700 Atherosclerosis of aorta: Secondary | ICD-10-CM | POA: Diagnosis not present

## 2022-02-06 DIAGNOSIS — I10 Essential (primary) hypertension: Secondary | ICD-10-CM | POA: Diagnosis present

## 2022-02-06 DIAGNOSIS — I5021 Acute systolic (congestive) heart failure: Secondary | ICD-10-CM | POA: Diagnosis not present

## 2022-02-06 DIAGNOSIS — E875 Hyperkalemia: Secondary | ICD-10-CM | POA: Diagnosis present

## 2022-02-06 DIAGNOSIS — I3139 Other pericardial effusion (noninflammatory): Secondary | ICD-10-CM | POA: Diagnosis present

## 2022-02-06 DIAGNOSIS — Z87310 Personal history of (healed) osteoporosis fracture: Secondary | ICD-10-CM

## 2022-02-06 DIAGNOSIS — I110 Hypertensive heart disease with heart failure: Secondary | ICD-10-CM | POA: Diagnosis not present

## 2022-02-06 DIAGNOSIS — M810 Age-related osteoporosis without current pathological fracture: Secondary | ICD-10-CM | POA: Diagnosis present

## 2022-02-06 DIAGNOSIS — Z79899 Other long term (current) drug therapy: Secondary | ICD-10-CM

## 2022-02-06 DIAGNOSIS — I4891 Unspecified atrial fibrillation: Secondary | ICD-10-CM | POA: Diagnosis not present

## 2022-02-06 DIAGNOSIS — Z87891 Personal history of nicotine dependence: Secondary | ICD-10-CM | POA: Diagnosis not present

## 2022-02-06 DIAGNOSIS — Z7901 Long term (current) use of anticoagulants: Secondary | ICD-10-CM

## 2022-02-06 DIAGNOSIS — R0602 Shortness of breath: Secondary | ICD-10-CM | POA: Diagnosis not present

## 2022-02-06 DIAGNOSIS — Y990 Civilian activity done for income or pay: Secondary | ICD-10-CM

## 2022-02-06 DIAGNOSIS — I4819 Other persistent atrial fibrillation: Secondary | ICD-10-CM

## 2022-02-06 DIAGNOSIS — N179 Acute kidney failure, unspecified: Secondary | ICD-10-CM | POA: Diagnosis not present

## 2022-02-06 DIAGNOSIS — R000 Tachycardia, unspecified: Secondary | ICD-10-CM | POA: Diagnosis not present

## 2022-02-06 DIAGNOSIS — W19XXXA Unspecified fall, initial encounter: Secondary | ICD-10-CM | POA: Diagnosis present

## 2022-02-06 DIAGNOSIS — I509 Heart failure, unspecified: Secondary | ICD-10-CM | POA: Diagnosis not present

## 2022-02-06 DIAGNOSIS — I4892 Unspecified atrial flutter: Secondary | ICD-10-CM | POA: Diagnosis not present

## 2022-02-06 HISTORY — DX: Gastro-esophageal reflux disease without esophagitis: K21.9

## 2022-02-06 HISTORY — DX: Unspecified atrial fibrillation: I48.91

## 2022-02-06 LAB — BASIC METABOLIC PANEL
Anion gap: 11 (ref 5–15)
BUN: 9 mg/dL (ref 8–23)
CO2: 26 mmol/L (ref 22–32)
Calcium: 9.4 mg/dL (ref 8.9–10.3)
Chloride: 103 mmol/L (ref 98–111)
Creatinine, Ser: 0.76 mg/dL (ref 0.44–1.00)
GFR, Estimated: >60 mL/min (ref >60)
Glucose, Bld: 89 mg/dL (ref 70–99)
Potassium: 3.5 mmol/L (ref 3.5–5.1)
Sodium: 140 mmol/L (ref 135–145)

## 2022-02-06 LAB — CBC
HCT: 38 % (ref 36.0–46.0)
Hemoglobin: 11.7 g/dL — ABNORMAL LOW (ref 12.0–15.0)
MCH: 29.4 pg (ref 26.0–34.0)
MCHC: 30.8 g/dL (ref 30.0–36.0)
MCV: 95.5 fL (ref 80.0–100.0)
Platelets: 270 10*3/uL (ref 150–400)
RBC: 3.98 MIL/uL (ref 3.87–5.11)
RDW: 12.9 % (ref 11.5–15.5)
WBC: 8 10*3/uL (ref 4.0–10.5)
nRBC: 0 % (ref 0.0–0.2)

## 2022-02-06 LAB — TSH: TSH: 1.223 u[IU]/mL (ref 0.350–4.500)

## 2022-02-06 LAB — MAGNESIUM: Magnesium: 1.7 mg/dL (ref 1.7–2.4)

## 2022-02-06 MED ORDER — HYDRALAZINE HCL 20 MG/ML IJ SOLN: 5.0000 mg | INTRAMUSCULAR | Status: DC | PRN

## 2022-02-06 MED ORDER — DILTIAZEM HCL-DEXTROSE 125-5 MG/125ML-% IV SOLN (PREMIX)
5.0000 mg/h | INTRAVENOUS | Status: DC
  Administered 2022-02-06: 5 mg/h via INTRAVENOUS
  Filled 2022-02-06: qty 125

## 2022-02-06 MED ORDER — ONDANSETRON HCL 4 MG PO TABS: 4.0000 mg | ORAL_TABLET | Freq: Four times a day (QID) | ORAL | Status: DC | PRN

## 2022-02-06 MED ORDER — ONDANSETRON HCL 4 MG/2ML IJ SOLN: 4.0000 mg | Freq: Four times a day (QID) | INTRAMUSCULAR | Status: DC | PRN

## 2022-02-06 MED ORDER — PANTOPRAZOLE SODIUM 40 MG PO TBEC
40.0000 mg | DELAYED_RELEASE_TABLET | Freq: Every day | ORAL | Status: DC
  Administered 2022-02-07 – 2022-02-10 (×4): 40 mg via ORAL
  Filled 2022-02-06 (×4): qty 1

## 2022-02-06 MED ORDER — APIXABAN 5 MG PO TABS
5.0000 mg | ORAL_TABLET | Freq: Two times a day (BID) | ORAL | Status: DC
  Administered 2022-02-06 – 2022-02-10 (×8): 5 mg via ORAL
  Filled 2022-02-06 (×8): qty 1

## 2022-02-06 MED ORDER — SODIUM CHLORIDE 0.9% FLUSH
3.0000 mL | Freq: Two times a day (BID) | INTRAVENOUS | Status: DC
  Administered 2022-02-06 – 2022-02-10 (×5): 3 mL via INTRAVENOUS

## 2022-02-06 MED ORDER — DILTIAZEM LOAD VIA INFUSION
10.0000 mg | Freq: Once | INTRAVENOUS | Status: AC
  Administered 2022-02-06: 10 mg via INTRAVENOUS
  Filled 2022-02-06: qty 10

## 2022-02-06 MED ORDER — ACETAMINOPHEN 325 MG PO TABS: 650.0000 mg | ORAL_TABLET | Freq: Four times a day (QID) | ORAL | Status: DC | PRN

## 2022-02-06 MED ORDER — METOPROLOL TARTRATE 25 MG PO TABS
25.0000 mg | ORAL_TABLET | Freq: Two times a day (BID) | ORAL | Status: DC
  Administered 2022-02-06 – 2022-02-07 (×2): 25 mg via ORAL
  Filled 2022-02-06 (×2): qty 1

## 2022-02-06 MED ORDER — ACETAMINOPHEN 650 MG RE SUPP: 650.0000 mg | Freq: Four times a day (QID) | RECTAL | Status: DC | PRN

## 2022-02-06 NOTE — ED Notes (Signed)
Pt offers no complaints, denies chest pain, denies sob, denies palpitations, pt states that she wants to go home

## 2022-02-06 NOTE — ED Triage Notes (Signed)
Pt referred to ED from PCP's office. Pt was originally going for a change in medication, but had EKG done and was noted to be in Afib RVR. Pt states she has no hx of same. Denies any CP. Mild DOE per pt.

## 2022-02-06 NOTE — H&P (Signed)
History and Physical    Patient: Bianca Wolf [id number] DOB: 05/25/1940 DOA: 02/06/2022 DOS: the patient was seen and examined on 02/06/2022 PCP: Merri Brunette, MD  Patient coming from: Home - lives with daughter; NOK: Daughter, Bianca Wolf, [phone]   Chief Complaint: DOE  HPI: Bianca Wolf is a 81 y.o. female with medical history significant of HTN presenting with palpitations. She reports that she was supposed to go check on her Prolia today - has to do an IV because it is cheaper ($50 vs. 287).  She also planned to see Dr. Renne Crigler in the morning for SOB for the last month.  It comes and goes, worse with exertion.  She has been out of work for back trouble for about 3 months, but she finally went back to work at [employer] and fell over something 2 days later and bruised her chest 3-4 weeks ago.  She is still working despite the fall - 4 hours a day 4 days a week.  She does not feel palpitations or her heart racing.       ER Course:  New afib/flutter.  Cards will see.  On Dilt.     Review of Systems: As mentioned in the history of present illness. All other systems reviewed and are negative. Past Medical History:  Diagnosis Date   Atrial fibrillation (HCC)    GERD (gastroesophageal reflux disease)    Hypertension    Past Surgical History:  Procedure Laterality Date   IR KYPHO LUMBAR INC FX REDUCE BONE BX UNI/BIL CANNULATION INC/IMAGING  09/21/2021   Social History:  reports that she quit smoking about 43 years ago. Her smoking use included cigarettes. She has a 25.00 pack-year smoking history. She has never used smokeless tobacco. She reports that she does not drink alcohol and does not use drugs.  No Known Allergies  Family History  Problem Relation Age of Onset   Atrial fibrillation Neg Hx     Prior to Admission medications   Medication Sig Start Date End Date Taking? Authorizing Provider  acetaminophen (TYLENOL) 325 MG tablet Take 2 tablets (650 mg total) by  mouth every 6 (six) hours as needed. 10/11/15  Yes Melton Krebs, PA-C  benazepril (LOTENSIN) 20 MG tablet Take 20 mg by mouth daily. 07/09/21  Yes [provider]  calcium citrate (CALCITRATE - DOSED IN MG ELEMENTAL CALCIUM) 950 (200 Ca) MG tablet Take 200 mg of elemental calcium by mouth daily.   Yes [provider]  Cholecalciferol (VITAMIN D-3) 5000 UNIT/ML LIQD Take 1 capsule by mouth daily.   Yes [provider]  denosumab (PROLIA) 60 MG/ML SOSY injection Inject 60 mg into the skin every 6 (six) months. 09/14/21  Yes [provider]  omeprazole (PRILOSEC) 20 MG capsule Take 20 mg by mouth daily. 07/10/21  Yes [provider]  nitrofurantoin, macrocrystal-monohydrate, (MACROBID) 100 MG capsule Take 100 mg by mouth every 12 (twelve) hours. Patient not taking: Reported on 02/06/2022 08/23/21   [provider]    Physical Exam: Vitals:   02/06/22 1549 02/06/22 1621 02/06/22 1645 02/06/22 1715  BP: (!) 146/104 (!) 161/76 (!) 149/109 (!) 145/67  Pulse: (!) 112 (!) 110 (!) 105 (!) 125  Resp: (!) 23 (!) 22 (!) 24 (!) 26  Temp:      TempSrc:      SpO2: 100% 100% 94% 95%  Weight:      Height:       General:  Appears calm and comfortable and  is in NAD Eyes:  PERRL, EOMI, normal lids, iris ENT:  grossly normal hearing, lips & tongue, mmm Neck:  no LAD, masses or thyromegaly Cardiovascular:  Irregularly irregular with mild tachycardia, no m/r/g. Tr-1+ LE edema.  Respiratory:   CTA bilaterally with no wheezes/rales/rhonchi.  Normal to mildly increased respiratory effort. Abdomen:  soft, NT, ND Skin:  no rash or induration seen on limited exam Musculoskeletal:  grossly normal tone BUE/BLE, good ROM, no bony abnormality Psychiatric:  grossly normal mood and affect, speech fluent and appropriate, AOx3 Neurologic:  CN 2-12 grossly intact, moves all extremities in coordinated fashion   Radiological Exams on Admission: Independently  reviewed - see discussion in A/P where applicable  DG Chest 2 View  Result Date: 02/06/2022 CLINICAL DATA:  Shortness of breath for 1 month. EXAM: CHEST - 2 VIEW COMPARISON:  Chest/rib radiographs 10/11/2015 FINDINGS: The cardiac silhouette is borderline enlarged. The lungs are hyperinflated with mild chronic peribronchial thickening. No acute airspace consolidation, edema, pleural effusion, or pneumothorax is identified. No acute osseous abnormality is seen. IMPRESSION: Chronic bronchitic changes. No evidence of acute cardiopulmonary process. Electronically Signed   By: Logan Bores M.D.   On: 02/06/2022 13:43    EKG: Independently reviewed.  Afib with rate 123; nonspecific ST changes with no evidence of acute ischemia   Labs on Admission: I have personally reviewed the available labs and imaging studies at the time of the admission.  Pertinent labs:   Normal BMP Unremarkable CBC TSH 1.223    Assessment and Plan: Principal Problem:   Atrial fibrillation with RVR (HCC) Active Problems:   Age-related osteoporosis without current pathological fracture   Essential hypertension    New onset Afib with RVR  -Patient presenting with new-onset afib.  -Etiology is thought to be related to HTN -She does have mild LE edema but this appears more likely related to the afib than from prior CHF -Since the afib onset is unknown (SOB has been going on for about a month), will focus on rate control and searching for the underlying cause at this time. -Will admit to SDU for Diltiazem drip as per protocol with plan to transition to PO Diltiazem once heart rate is controlled (resting HR 110 or lower) -Will request Echocardiogram for further evaluation  -Will consult cardiology  -CHA2DS2-VASc Score is >2 and so patient would benefit from oral anticoagulation. There is evidence of net benefit even in the extremely elderly population. -Will start Eliquis for now, but this may be cost-prohibitive for the  patient; will request TOC consult regarding her most cost-effective option based on insurance  HTN -Hold benazepril for now give Dilt drip -Will also add prn hydralazine  Osteoporosis -On Prolia -h/o compression fracture -She is looking into more cost-effective treatment with her PCP    Advance Care Planning:   Code Status: Full Code   Consults: Cardiology; [facility] team  DVT Prophylaxis: Eliquis  Family Communication: None present; she repeatedly declined having me call her daughter at the time of admission  Severity of Illness: The appropriate patient status for this patient is INPATIENT. Inpatient status is judged to be reasonable and necessary in order to provide the required intensity of service to ensure the patient's safety. The patient's presenting symptoms, physical exam findings, and initial radiographic and laboratory data in the context of their chronic comorbidities is felt to place them at high risk for further clinical deterioration. Furthermore, it is not anticipated that the patient will be medically stable for discharge from the  hospital within 2 midnights of admission.   * I certify that at the point of admission it is my clinical judgment that the patient will require inpatient hospital care spanning beyond 2 midnights from the point of admission due to high intensity of service, high risk for further deterioration and high frequency of surveillance required.*  Author: Karmen Bongo, MD 02/06/2022 5:51 PM  For on call review [email or web address].

## 2022-02-06 NOTE — ED Notes (Addendum)
MD secured chat to increase cardizem to 15mg 

## 2022-02-06 NOTE — ED Notes (Signed)
Charge nurse notified of need for room due to Afib RVR and DOE.

## 2022-02-06 NOTE — Consult Note (Signed)
Cardiology Consultation   Patient ID: Bianca Wolf MRN: [id number]; DOB: 1940-10-18  Admit date: 02/06/2022 Date of Consult: 02/06/2022  PCP:  [name], [place] Providers Cardiologist:  None        Patient Profile:   Bianca Wolf is a 81 y.o. female with a hx of hypertension who is being seen 02/06/2022 for the evaluation of new onset atrial fibrillation at the request of Dr. Lorin Mercy.  History of Present Illness:   [name] presented to the hospital for new onset atrial fibrillation.  She went to the doctor's office to get her Prolia injection and was noted to be tachycardic.  She was already planning to see her PCP tomorrow for shortness of breath for the preceding month.  She notes that has been on and off for the last month.  Around 3 to 4 weeks ago she had a fall at work.  She tripped and denied any preceding chest pain, shortness of breath, palpitations.  Since then she has had chest wall tenderness and has been very short of breath.  She is also had intermittent palpitations.  She notes that it is hard for her to take a deep breath.  In the ED she was noted to be in atrial fibrillation with RVR.  She was started on diltiazem drip.  She currently reports feeling well.  She has noted some increased lower extremity edema and some orthopnea.  She currently denies palpitations.  She is still very physically active and works at [employer].  Her home benazepril is on hold. Past Medical History:  Diagnosis Date   Atrial fibrillation (HCC)    GERD (gastroesophageal reflux disease)    Hypertension     Past Surgical History:  Procedure Laterality Date   IR KYPHO LUMBAR INC FX REDUCE BONE BX UNI/BIL CANNULATION INC/IMAGING  09/21/2021     Home Medications:  Prior to Admission medications   Medication Sig Start Date End Date Taking? Authorizing Provider  acetaminophen (TYLENOL) 325 MG tablet Take 2 tablets (650 mg total) by mouth every 6 (six) hours as needed.  10/11/15  Yes [place] Lions, PA-C  benazepril (LOTENSIN) 20 MG tablet Take 20 mg by mouth daily. 07/09/21  Yes [provider]  calcium citrate (CALCITRATE - DOSED IN MG ELEMENTAL CALCIUM) 950 (200 Ca) MG tablet Take 200 mg of elemental calcium by mouth daily.   Yes [provider]  Cholecalciferol (VITAMIN D-3) 5000 UNIT/ML LIQD Take 1 capsule by mouth daily.   Yes [provider]  denosumab (PROLIA) 60 MG/ML SOSY injection Inject 60 mg into the skin every 6 (six) months. 09/14/21  Yes [provider]  omeprazole (PRILOSEC) 20 MG capsule Take 20 mg by mouth daily. 07/10/21  Yes [provider]    Inpatient Medications: Scheduled Meds:  apixaban  5 mg Oral BID   [START ON 02/07/2022] pantoprazole  40 mg Oral Daily   sodium chloride flush  3 mL Intravenous Q12H   Continuous Infusions:  diltiazem (CARDIZEM) infusion 10 mg/hr (02/06/22 1655)   PRN Meds: acetaminophen **OR** acetaminophen, hydrALAZINE, ondansetron **OR** ondansetron (ZOFRAN) IV  Allergies:   No Known Allergies  Social History:   Social History   Socioeconomic History   Marital status: Widowed    Spouse name: Not on file   Number of children: Not on file   Years of education: Not on file   Highest education level: Not on file  Occupational History   Not on  file  Tobacco Use   Smoking status: Former    Packs/day: 1.00    Years: 25.00    Total pack years: 25.00    Types: Cigarettes    Quit date: 1980    Years since quitting: 43.8   Smokeless tobacco: Never  Substance and Sexual Activity   Alcohol use: No   Drug use: No   Sexual activity: Yes    Birth control/protection: None  Other Topics Concern   Not on file  Social History Narrative   Not on file   Social Determinants of Health   Financial Resource Strain: Not on file  Food Insecurity: Not on file  Transportation Needs: Not on file  Physical Activity: Not on file  Stress: Not on file  Social  Connections: Not on file  Intimate Partner Violence: Not on file    Family History:    Family History  Problem Relation Age of Onset   Atrial fibrillation Neg Hx      ROS:  Please see the history of present illness.   All other ROS reviewed and negative.     Physical Exam/Data:   Vitals:   02/06/22 1815 02/06/22 1845 02/06/22 1926 02/06/22 1930  BP: (!) 157/74 (!) 149/109  138/82  Pulse: (!) 42 75  (!) 123  Resp: (!) 26 (!) 24  (!) 22  Temp:   98.2 F (36.8 C)   TempSrc:   Oral   SpO2: 94% 94%  98%  Weight:      Height:       No intake or output data in the 24 hours ending 02/06/22 1943    02/06/2022   12:58 PM 12/21/2021    2:31 PM 11/27/2021    1:16 PM  Last 3 Weights  Weight (lbs) 166 lb 161 lb 12.8 oz 160 lb 12.8 oz  Weight (kg) 75.297 kg 73.392 kg 72.938 kg     VS:  BP 138/82   Pulse (!) 123   Temp 98.2 F (36.8 C) (Oral)   Resp (!) 22   Ht 5\' 4"  (1.626 m)   Wt 75.3 kg   SpO2 98%   BMI 28.49 kg/m  , BMI Body mass index is 28.49 kg/m. GENERAL:  Well appearing HEENT: Pupils equal round and reactive, fundi not visualized, oral mucosa unremarkable NECK:  No jugular venous distention, waveform within normal limits, carotid upstroke brisk and symmetric, no bruits, no thyromegaly LUNGS:  Clear to auscultation bilaterally HEART: Irregularly irregular.  Tachycardic.  PMI not displaced or sustained,S1 and S2 within normal limits, no S3, no S4, no clicks, no rubs, no murmurs ABD:  Flat, positive bowel sounds normal in frequency in pitch, no bruits, no rebound, no guarding, no midline pulsatile mass, no hepatomegaly, no splenomegaly EXT:  2 plus pulses throughout, no edema, no cyanosis no clubbing SKIN:  No rashes no nodules NEURO:  Cranial nerves II through XII grossly intact, motor grossly intact throughout PSYCH:  Cognitively intact, oriented to person place and time   EKG:  The EKG was personally reviewed and demonstrates:  Atrial flutter with variable  ventricular conduction.  PVCs.  Rate 123 bpm. Telemetry:  Telemetry was personally reviewed and demonstrates:  atrial fibrillation.  Rate 90s-120s  Relevant CV Studies:  Echo pending  Laboratory Data:  High Sensitivity Troponin:  No results for input(s): "TROPONINIHS" in the last 720 hours.   Chemistry Recent Labs  Lab 02/06/22 1327 02/06/22 1531  NA 140  --   K 3.5  --  CL 103  --   CO2 26  --   GLUCOSE 89  --   BUN 9  --   CREATININE 0.76  --   CALCIUM 9.4  --   MG  --  1.7  GFRNONAA >60  --   ANIONGAP 11  --     No results for input(s): "PROT", "ALBUMIN", "AST", "ALT", "ALKPHOS", "BILITOT" in the last 168 hours. Lipids No results for input(s): "CHOL", "TRIG", "HDL", "LABVLDL", "LDLCALC", "CHOLHDL" in the last 168 hours.  Hematology Recent Labs  Lab 02/06/22 1327  WBC 8.0  RBC 3.98  HGB 11.7*  HCT 38.0  MCV 95.5  MCH 29.4  MCHC 30.8  RDW 12.9  PLT 270   Thyroid  Recent Labs  Lab 02/06/22 1327  TSH 1.223    BNPNo results for input(s): "BNP", "PROBNP" in the last 168 hours.  DDimer No results for input(s): "DDIMER" in the last 168 hours.   Radiology/Studies:  DG Chest 2 View  Result Date: 02/06/2022 CLINICAL DATA:  Shortness of breath for 1 month. EXAM: CHEST - 2 VIEW COMPARISON:  Chest/rib radiographs 10/11/2015 FINDINGS: The cardiac silhouette is borderline enlarged. The lungs are hyperinflated with mild chronic peribronchial thickening. No acute airspace consolidation, edema, pleural effusion, or pneumothorax is identified. No acute osseous abnormality is seen. IMPRESSION: Chronic bronchitic changes. No evidence of acute cardiopulmonary process. Electronically Signed   By: Logan Bores M.D.   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  Rates are improved but still not adequately controlled.  Increase diltiazem to 15 mg/h.  Add  metoprolol tartrate 25 mg twice daily.  Thyroid function is normal.  She is very mildly anemic.  Echo is pending.  She is very mildly volume overloaded and reports of shortness of breath..  We will give a dose of Lasix 20 mg IV.  This may have started from chest wall trauma after fall.  If we can get her adequately rate controlled we will plan for outpatient cardioversion in 3 weeks.  If rate control proves challenging we will get TEE/DCCV this admission.  # Hypertension:  Home benazepril is currently on hold.  Increase diltiazem and add metoprolol as above.  Risk Assessment/Risk Scores:          CHA2DS2-VASc Score = 4   This indicates a 4.8% annual risk of stroke. The patient's score is based upon: CHF History: 0 HTN History: 1 Diabetes History: 0 Stroke History: 0 Vascular Disease History: 0 Age Score: 2 Gender Score: 1      For questions or updates, please contact [place] Please consult [email or web address] for contact info under    Signed, Skeet Latch, MD  02/06/2022 7:43 PM

## 2022-02-06 NOTE — ED Provider Notes (Signed)
[facility] EMERGENCY DEPARTMENT Provider Note   CSN: [id number] Arrival date & time: 02/06/22  1247     History  Chief Complaint  Patient presents with   Atrial Fibrillation    Bianca Wolf is a 81 y.o. female.  HPI Adult female presents with dyspnea, palpitations.  Onset was about 1 month ago, since that time symptoms have been persistent, although waxing, waning severity.  Today she went for follow-up for another medical problem, was found to have evidence for atrial fibrillation with rapid ventricular response in the office and she was sent here for evaluation.  She denies chest pain, abdominal pain, syncope, acknowledges some lightheadedness, however.     Home Medications Prior to Admission medications   Medication Sig Start Date End Date Taking? Authorizing Provider  benazepril (LOTENSIN) 20 MG tablet Take 20 mg by mouth daily. 07/09/21  Yes [provider]  Cholecalciferol (VITAMIN D-3) 5000 UNIT/ML LIQD Take 1 capsule by mouth daily.   Yes [provider]  omeprazole (PRILOSEC) 20 MG capsule Take 20 mg by mouth daily. 07/10/21  Yes [provider]  acetaminophen (TYLENOL) 325 MG tablet Take 2 tablets (650 mg total) by mouth every 6 (six) hours as needed. 10/11/15   Tama Lions, PA-C  lisinopril (PRINIVIL,ZESTRIL) 10 MG tablet Take 10 mg by mouth daily.    [provider]  nitrofurantoin, macrocrystal-monohydrate, (MACROBID) 100 MG capsule Take 100 mg by mouth every 12 (twelve) hours. 08/23/21   [provider]      Allergies    Patient has no known allergies.    Review of Systems   Review of Systems  All other systems reviewed and are negative.   Physical Exam Updated Vital Signs BP (!) 146/104   Pulse (!) 112   Temp 97.8 F (36.6 C) (Oral)   Resp (!) 23   Ht 5\' 4"  (1.626 m)   Wt 75.3 kg   SpO2 100%   BMI 28.49 kg/m  Physical Exam Vitals and nursing note reviewed.  Constitutional:       General: She is not in acute distress.    Appearance: She is well-developed.  HENT:     Head: Normocephalic and atraumatic.  Eyes:     Conjunctiva/sclera: Conjunctivae normal.  Cardiovascular:     Rate and Rhythm: Tachycardia present. Rhythm irregular.  Pulmonary:     Effort: Pulmonary effort is normal. No respiratory distress.     Breath sounds: Normal breath sounds. No stridor.  Abdominal:     General: There is no distension.  Skin:    General: Skin is warm and dry.  Neurological:     Mental Status: She is alert and oriented to person, place, and time.     Cranial Nerves: No cranial nerve deficit.  Psychiatric:        Mood and Affect: Mood normal.     ED Results / Procedures / Treatments   Labs (all labs ordered are listed, but only abnormal results are displayed) Labs Reviewed  CBC - Abnormal; Notable for the following components:      Result Value   Hemoglobin 11.7 (*)    All other components within normal limits  BASIC METABOLIC PANEL  TSH  MAGNESIUM    EKG EKG Interpretation  Date/Time:  Tuesday February 06 2022 15:11:40 EDT Ventricular Rate:  123 PR Interval:  139 QRS Duration: 91 QT Interval:  320 QTC Calculation: 458 R Axis:   30 Text Interpretation: Supraventricular tachycardia , likely  aflutter Multiform ventricular premature complexes RSR' in V1 or V2, probably normal variant Abnormal ekg Confirmed by Carmin Muskrat [phone]) on 02/06/2022 3:26:46 PM  Radiology DG Chest 2 View  Result Date: 02/06/2022 CLINICAL DATA:  Shortness of breath for 1 month. EXAM: CHEST - 2 VIEW COMPARISON:  Chest/rib radiographs 10/11/2015 FINDINGS: The cardiac silhouette is borderline enlarged. The lungs are hyperinflated with mild chronic peribronchial thickening. No acute airspace consolidation, edema, pleural effusion, or pneumothorax is identified. No acute osseous abnormality is seen. IMPRESSION: Chronic bronchitic changes. No evidence of acute cardiopulmonary process.  Electronically Signed   By: Logan Bores M.D.   On: 02/06/2022 13:43    Procedures Procedures    Medications Ordered in ED Medications  diltiazem (CARDIZEM) 1 mg/mL load via infusion 10 mg (10 mg Intravenous Bolus from Bag 02/06/22 1549)    And  diltiazem (CARDIZEM) 125 mg in dextrose 5% 125 mL (1 mg/mL) infusion (5 mg/hr Intravenous New Bag/Given 02/06/22 1549)    ED Course/ Medical Decision Making/ A&P This patient with a Hx of back pain from compression fracture, hypertension, presents to the ED for concern of dyspnea, fatigue, palpitations, this involves an extensive number of treatment options, and is a complaint that carries with it a high risk of complications and morbidity.    The differential diagnosis includes atrial fibrillation, atrial flutter, SVT, dehydration, electrolyte abnormalities   Social Determinants of Health:  Advanced age  Additional history obtained:  Additional history and/or information obtained from chart review including outpatient visit today with notation for atrial fibrillation with rapid ventricular response    After the initial evaluation, orders, including: Labs monitoring ECG x-ray were initiated.   Patient placed on Cardiac and Pulse-Oximetry Monitors. The patient was maintained on a cardiac monitor.  The cardiac monitored showed an rhythm of 120s atrial fib abnormal The patient was also maintained on pulse oximetry. The readings were typically 97% room air normal   On repeat evaluation of the patient stayed the same  Lab Tests:  I personally interpreted labs.  The pertinent results include: Unremarkable labs  Imaging Studies ordered:  I independently visualized and interpreted imaging which showed chest x-ray reassuring unremarkable I agree with the radiologist interpretation  Consultations Obtained:  I requested consultation with the cardiology,  and discussed lab and imaging findings as well as pertinent plan - they recommend:  We reviewed the ECG, concern for atrial flutter, SVT, and cardiology will follow as a consulting service.  Recommendation for admission.  Dispostion / Final MDM:  After consideration of the diagnostic results and the patient's response to treatment, patient was presenting with new dyspnea, palpitations, some concern for outpatient visit for A-fib is found to have evidence for atrial flutter and atrial fibrillation here on monitor.  Patient is awake, alert, afebrile, nondistressed but with new arrhythmia, onset likely more than 4 days ago she is not a candidate for cardioversion, was started on rate control agent, diltiazem, case discussed with cardiology, patient admitted for further monitoring, management.  Final Clinical Impression(s) / ED Diagnoses Final diagnoses:  New onset a-fib ([place])   CRITICAL CARE Performed by: Carmin Muskrat Total critical care time: 35 minutes Critical care time was exclusive of separately billable procedures and treating other patients. Critical care was necessary to treat or prevent imminent or life-threatening deterioration. Critical care was time spent personally by me on the following activities: development of treatment plan with patient and/or surrogate as well as nursing, discussions with consultants, evaluation of patient's response to treatment,  examination of patient, obtaining history from patient or surrogate, ordering and performing treatments and interventions, ordering and review of laboratory studies, ordering and review of radiographic studies, pulse oximetry and re-evaluation of patient's condition.    Carmin Muskrat, MD 02/06/22 1620

## 2022-02-06 NOTE — ED Provider Triage Note (Signed)
Emergency Medicine Provider Triage Evaluation Note  Bianca Wolf , a 81 y.o. female  was evaluated in triage.  Pt complains of shortness of breath. Went to the doctor today and her EKG showed afib RVR. No hx of same. Had an injury to her chest a month ago and has some discomfort related to that, but the SOB started since then.  Review of Systems  Positive: SOB, DOE Negative: Cp, headache, syncope, dizziness  Physical Exam  BP (!) 157/104   Pulse (!) 122   Temp 97.9 F (36.6 C) (Oral)   Resp 16   Ht 5\' 4"  (1.626 m)   Wt 75.3 kg   SpO2 98%   BMI 28.49 kg/m  Gen:   Awake, no distress   Resp:  Normal effort  MSK:   Moves extremities without difficulty  Other:    Medical Decision Making  Medically screening exam initiated at 1:19 PM.  Appropriate orders placed.  Bianca Wolf was informed that the remainder of the evaluation will be completed by another provider, this initial triage assessment does not replace that evaluation, and the importance of remaining in the ED until their evaluation is complete.  EKG from PCP shows afib with rate of 122. Workup initiated   Fields Oros T, PA-C 02/06/22 1321

## 2022-02-07 ENCOUNTER — Inpatient Hospital Stay (HOSPITAL_COMMUNITY): Payer: Medicare HMO

## 2022-02-07 ENCOUNTER — Other Ambulatory Visit (HOSPITAL_COMMUNITY): Payer: Self-pay

## 2022-02-07 ENCOUNTER — Telehealth (HOSPITAL_COMMUNITY): Payer: Self-pay

## 2022-02-07 DIAGNOSIS — I509 Heart failure, unspecified: Secondary | ICD-10-CM

## 2022-02-07 DIAGNOSIS — I4819 Other persistent atrial fibrillation: Secondary | ICD-10-CM

## 2022-02-07 DIAGNOSIS — I4891 Unspecified atrial fibrillation: Secondary | ICD-10-CM | POA: Diagnosis not present

## 2022-02-07 DIAGNOSIS — I10 Essential (primary) hypertension: Secondary | ICD-10-CM | POA: Diagnosis not present

## 2022-02-07 LAB — CBC
HCT: 35.1 % — ABNORMAL LOW (ref 36.0–46.0)
Hemoglobin: 11.2 g/dL — ABNORMAL LOW (ref 12.0–15.0)
MCH: 30.2 pg (ref 26.0–34.0)
MCHC: 31.9 g/dL (ref 30.0–36.0)
MCV: 94.6 fL (ref 80.0–100.0)
Platelets: 241 10*3/uL (ref 150–400)
RBC: 3.71 MIL/uL — ABNORMAL LOW (ref 3.87–5.11)
RDW: 12.9 % (ref 11.5–15.5)
WBC: 8.3 10*3/uL (ref 4.0–10.5)
nRBC: 0 % (ref 0.0–0.2)

## 2022-02-07 LAB — BASIC METABOLIC PANEL
Anion gap: 7 (ref 5–15)
BUN: 8 mg/dL (ref 8–23)
CO2: 29 mmol/L (ref 22–32)
Calcium: 9 mg/dL (ref 8.9–10.3)
Chloride: 103 mmol/L (ref 98–111)
Creatinine, Ser: 0.75 mg/dL (ref 0.44–1.00)
GFR, Estimated: >60 mL/min (ref >60)
Glucose, Bld: 122 mg/dL — ABNORMAL HIGH (ref 70–99)
Potassium: 3.8 mmol/L (ref 3.5–5.1)
Sodium: 139 mmol/L (ref 135–145)

## 2022-02-07 LAB — ECHOCARDIOGRAM COMPLETE
Area-P 1/2: 5.19 cm2
Height: 64 in
S' Lateral: 3.5 cm
Weight: 2656 oz

## 2022-02-07 MED ORDER — MAGNESIUM SULFATE 2 GM/50ML IV SOLN
2.0000 g | Freq: Once | INTRAVENOUS | Status: AC
  Administered 2022-02-07: 2 g via INTRAVENOUS
  Filled 2022-02-07: qty 50

## 2022-02-07 MED ORDER — METOPROLOL TARTRATE 25 MG PO TABS
12.5000 mg | ORAL_TABLET | Freq: Once | ORAL | Status: AC
  Administered 2022-02-07: 12.5 mg via ORAL
  Filled 2022-02-07: qty 1

## 2022-02-07 MED ORDER — METOPROLOL TARTRATE 25 MG PO TABS
37.5000 mg | ORAL_TABLET | Freq: Two times a day (BID) | ORAL | Status: DC
  Filled 2022-02-07: qty 2

## 2022-02-07 MED ORDER — POTASSIUM CHLORIDE CRYS ER 20 MEQ PO TBCR
40.0000 meq | EXTENDED_RELEASE_TABLET | ORAL | Status: AC
  Administered 2022-02-07 (×2): 40 meq via ORAL
  Filled 2022-02-07 (×2): qty 2

## 2022-02-07 NOTE — Progress Notes (Signed)
  Echocardiogram 2D Echocardiogram has been performed.  Bianca Wolf M 02/07/2022, 10:47 AM

## 2022-02-07 NOTE — ED Notes (Signed)
Patient denies pain and is resting comfortably.  

## 2022-02-07 NOTE — Progress Notes (Signed)
Rounding Note    Patient Name: Bianca Wolf Date of Encounter: 02/07/2022  [place] Cardiologist: None   Subjective   BP 135/72.  I/Os not recorded.  Cr stable at 0.75.  Denies any chest pain or dyspnea  Inpatient Medications    Scheduled Meds:  apixaban  5 mg Oral BID   metoprolol tartrate  25 mg Oral BID   pantoprazole  40 mg Oral Daily   sodium chloride flush  3 mL Intravenous Q12H   Continuous Infusions:  diltiazem (CARDIZEM) infusion Stopped (02/07/22 0024)   PRN Meds: acetaminophen **OR** acetaminophen, hydrALAZINE, ondansetron **OR** ondansetron (ZOFRAN) IV   Vital Signs    Vitals:   02/07/22 0600 02/07/22 0700 02/07/22 0800 02/07/22 0813  BP: (!) 124/100 (!) 143/71 135/72   Pulse: 80 71 100   Resp: (!) 22 (!) 23 (!) 27   Temp:    98 F (36.7 C)  TempSrc:    Oral  SpO2: 93% 94% 95%   Weight:      Height:        Intake/Output Summary (Last 24 hours) at 02/07/2022 0912 Last data filed at 02/07/2022 0226 Gross per 24 hour  Intake 50 ml  Output --  Net 50 ml      02/06/2022   12:58 PM 12/21/2021    2:31 PM 11/27/2021    1:16 PM  Last 3 Weights  Weight (lbs) 166 lb 161 lb 12.8 oz 160 lb 12.8 oz  Weight (kg) 75.297 kg 73.392 kg 72.938 kg      Telemetry    Afib with rates 60-100s - Personally Reviewed  ECG    No new ECG - Personally Reviewed  Physical Exam   GEN: No acute distress.   Neck: No JVD Cardiac: irregular, normal rate, no murmurs, rubs, or gallops.  Respiratory: Clear to auscultation bilaterally. GI: Soft, nontender, non-distended  MS: No edema; No deformity. Neuro:  Nonfocal  Psych: Normal affect   Labs    High Sensitivity Troponin:  No results for input(s): "TROPONINIHS" in the last 720 hours.   Chemistry Recent Labs  Lab 02/06/22 1327 02/06/22 1531 02/07/22 0129  NA 140  --  139  K 3.5  --  3.8  CL 103  --  103  CO2 26  --  29  GLUCOSE 89  --  122*  BUN 9  --  8  CREATININE 0.76  --  0.75   CALCIUM 9.4  --  9.0  MG  --  1.7  --   GFRNONAA >60  --  >60  ANIONGAP 11  --  7    Lipids No results for input(s): "CHOL", "TRIG", "HDL", "LABVLDL", "LDLCALC", "CHOLHDL" in the last 168 hours.  Hematology Recent Labs  Lab 02/06/22 1327 02/07/22 0129  WBC 8.0 8.3  RBC 3.98 3.71*  HGB 11.7* 11.2*  HCT 38.0 35.1*  MCV 95.5 94.6  MCH 29.4 30.2  MCHC 30.8 31.9  RDW 12.9 12.9  PLT 270 241   Thyroid  Recent Labs  Lab 02/06/22 1327  TSH 1.223    BNPNo results for input(s): "BNP", "PROBNP" in the last 168 hours.  DDimer No results for input(s): "DDIMER" in the last 168 hours.   Radiology    DG Chest 2 View  Result Date: 02/06/2022 CLINICAL DATA:  Shortness of breath for 1 month. EXAM: CHEST - 2 VIEW COMPARISON:  Chest/rib radiographs 10/11/2015 FINDINGS: The cardiac silhouette is borderline enlarged. The lungs are hyperinflated with mild  chronic peribronchial thickening. No acute airspace consolidation, edema, pleural effusion, or pneumothorax is identified. No acute osseous abnormality is seen. IMPRESSION: Chronic bronchitic changes. No evidence of acute cardiopulmonary process. Electronically Signed   By: Logan Bores M.D.   On: 02/06/2022 13:43    Cardiac Studies     Patient Profile     81 y.o. female with history of hypertension who presents with new onset atrial fibrillation.  Assessment & Plan    Atrial fibrillation: New diagnosis.  CHA2DS2-VASc score 4 (hypertension, age x2, female) -Continue Eliquis 5 mg twice daily -Was started on diltiazem drip as well as metoprolol 25 mg twice daily for rate control.  Weaned off diltiazem overnight.  Rates now reasonably controlled on just metoprolol -Plan Echocardiogram.  If no systolic dysfunction and rates remain controlled, plan outpatient cardioversion in 3 weeks.  If EF reduced or difficult to rate control, will plan for TEE/DCCV  Acute heart failure: Mildly volume overloaded on exam yesterday.  Given dose of IV Lasix  20 mg yesterday, currently appears euvolemic. -Follow-up echocardiogram  Hypertension: Hold home benazepril while titrating diltiazem and metoprolol as above   For questions or updates, please contact [place] Please consult [email or web address] for contact info under        Signed, Donato Heinz, MD  02/07/2022, 9:12 AM

## 2022-02-07 NOTE — ED Notes (Signed)
RN noticed a sudden change in heart rhythm through heart monitor as well heart dropping down to 40s, patient assessed denies any new symptoms, denies pain, is watching comfortably TV. EKG captured.   MD Bridgett Larsson notified of new changes.

## 2022-02-07 NOTE — Progress Notes (Addendum)
PROGRESS NOTE    Bianca Wolf  [id number] DOB: 1940/09/30 DOA: 02/06/2022 PCP: [name], MD  Chief Complaint  Patient presents with   Atrial Fibrillation    Brief Narrative:  Bianca Wolf is Earnest Wolf 81 y.o. female with medical history significant of HTN presenting with palpitations. She reports that she was supposed to go check on her Prolia today - has to do an IV because it is cheaper ($50 vs. 287).  She also planned to see Dr. [name] in the morning for SOB for the last month.  It comes and goes, worse with exertion.  She has been out of work for back trouble for about 3 months, but she finally went back to work at [employer] and fell over something 2 days later and bruised her chest 3-4 weeks ago.  She is still working despite the fall - 4 hours [name] day 4 days [name] week.  She does not feel palpitations or her heart racing.     Assessment & Plan:   Principal Problem:   Atrial fibrillation with RVR (HCC) Active Problems:   Age-related osteoporosis without current pathological fracture   Essential hypertension   Atrial fibrillation (HCC)   Assessment and Plan: New onset Afib with RVR  -Patient presenting with new-onset afib.  -TSH wnl.  Related to longstanding HTN? -echo with EF 45-50%, mildly reduced RVSF, mildly elevated PASP -CHA2DS2-VASc Score is at least 4 (age, sex, htn)  -continue eliquis  -continue titrating metop as tolerated   Heart Failure Exacerbation - mildly reduced EF - seems euvolemic  - per cards  HTN -Hold benazepril -follow with metoprolol   Osteoporosis -On Prolia -h/o compression fracture -She is looking into more cost-effective treatment with her PCP    DVT prophylaxis: eliquis Code Status: full Family Communication: none Disposition:   Status is: Inpatient Remains inpatient appropriate because: need for continued cardiology evaluation   Consultants:  cardiology  Procedures:  Echo IMPRESSIONS     1. Left ventricular ejection  fraction, by estimation, is 45 to 50%. The  left ventricle has mildly decreased function. The left ventricle  demonstrates global hypokinesis. There is mild concentric left ventricular  hypertrophy. Left ventricular diastolic  parameters are indeterminate.   2. Right ventricular systolic function is mildly reduced. The right  ventricular size is normal. There is mildly elevated pulmonary artery  systolic pressure. The estimated right ventricular systolic pressure is  [id number] mmHg.   3. Left atrial size was mildly dilated.   4. The mitral valve is normal in structure. No evidence of mitral valve  regurgitation. No evidence of mitral stenosis.   5. The aortic valve was not well visualized. Aortic valve regurgitation  is not visualized. No aortic stenosis is present.   6. The inferior vena cava is normal in size with greater than 50%  respiratory variability, suggesting right atrial pressure of 3 mmHg.   7. Technically difficult study with poor acoustic windows.   Antimicrobials:  Anti-infectives (From admission, onward)    None       Subjective: No new complaints  Objective: Vitals:   02/07/22 1345 02/07/22 1430 02/07/22 1440 02/07/22 1445  BP: (!) 142/119 104/60  95/65  Pulse: (!) 119 (!) 104  (!) 57  Resp: 18 18  18   Temp:   98.2 F (36.8 C)   TempSrc:   Oral   SpO2: 94% 94%  94%  Weight:      Height:        Intake/Output Summary (Last  24 hours) at 02/07/2022 1759 Last data filed at 02/07/2022 0226 Gross per 24 hour  Intake 50 ml  Output --  Net 50 ml   Filed Weights   02/06/22 1258  Weight: 75.3 kg    Examination:  General exam: Appears calm and comfortable  Respiratory system: Clear to auscultation. Respiratory effort normal. Cardiovascular system: irregularly irregular  Gastrointestinal system: Abdomen is nondistended, soft and nontender. Central nervous system: Alert and oriented. No focal neurological deficits. Extremities: no LEE    Data Reviewed:  I have personally reviewed following labs and imaging studies  CBC: Recent Labs  Lab 02/06/22 1327 02/07/22 0129  WBC 8.0 8.3  HGB 11.7* 11.2*  HCT 38.0 35.1*  MCV 95.5 94.6  PLT 270 [id number]    Basic Metabolic Panel: Recent Labs  Lab 02/06/22 1327 02/06/22 1531 02/07/22 0129  NA 140  --  139  K 3.5  --  3.8  CL 103  --  103  CO2 26  --  29  GLUCOSE 89  --  122*  BUN 9  --  8  CREATININE 0.76  --  0.75  CALCIUM 9.4  --  9.0  MG  --  1.7  --     GFR: Estimated Creatinine Clearance: 53.8 mL/min (by C-G formula based on SCr of 0.75 mg/dL).  Liver Function Tests: No results for input(s): "AST", "ALT", "ALKPHOS", "BILITOT", "PROT", "ALBUMIN" in the last 168 hours.  CBG: No results for input(s): "GLUCAP" in the last 168 hours.   No results found for this or any previous visit (from the past 240 hour(s)).       Radiology Studies: ECHOCARDIOGRAM COMPLETE  Result Date: 02/07/2022    ECHOCARDIOGRAM REPORT   Patient Name:   Bianca Wolf Date of Exam: 02/07/2022 Medical Rec #:  [id number]      Height:       64.0 in Accession #:    [id number]     Weight:       166.0 lb Date of Birth:  1940-06-11      BSA:          1.807 m Patient Age:    73 years       BP:           137/89 mmHg Patient Gender: F              HR:           85 bpm. Exam Location:  Inpatient Procedure: 2D Echo, Color Doppler, Cardiac Doppler and 3D Echo Indications:    Atrial Fibrillation I48.91  History:        Patient has no prior history of Echocardiogram examinations.                 Arrythmias:Palpitations.; Risk Factors:Hypertension.  Sonographer:    Darlina Sicilian RDCS Referring Phys: [place]  1. Left ventricular ejection fraction, by estimation, is 45 to 50%. The left ventricle has mildly decreased function. The left ventricle demonstrates global hypokinesis. There is mild concentric left ventricular hypertrophy. Left ventricular diastolic parameters are indeterminate.  2. Right  ventricular systolic function is mildly reduced. The right ventricular size is normal. There is mildly elevated pulmonary artery systolic pressure. The estimated right ventricular systolic pressure is Q000111Q mmHg.  3. Left atrial size was mildly dilated.  4. The mitral valve is normal in structure. No evidence of mitral valve regurgitation. No evidence of mitral stenosis.  5. The aortic valve was not well  visualized. Aortic valve regurgitation is not visualized. No aortic stenosis is present.  6. The inferior vena cava is normal in size with greater than 50% respiratory variability, suggesting right atrial pressure of 3 mmHg.  7. Technically difficult study with poor acoustic windows. FINDINGS  Left Ventricle: Left ventricular ejection fraction, by estimation, is 45 to 50%. The left ventricle has mildly decreased function. The left ventricle demonstrates global hypokinesis. The left ventricular internal cavity size was normal in size. There is  mild concentric left ventricular hypertrophy. Left ventricular diastolic parameters are indeterminate. Right Ventricle: The right ventricular size is normal. No increase in right ventricular wall thickness. Right ventricular systolic function is mildly reduced. There is mildly elevated pulmonary artery systolic pressure. The tricuspid regurgitant velocity  is 3.05 m/s, and with an assumed right atrial pressure of 3 mmHg, the estimated right ventricular systolic pressure is 95.6 mmHg. Left Atrium: Left atrial size was mildly dilated. Right Atrium: Right atrial size was normal in size. Pericardium: There is no evidence of pericardial effusion. Mitral Valve: The mitral valve is normal in structure. No evidence of mitral valve regurgitation. No evidence of mitral valve stenosis. Tricuspid Valve: The tricuspid valve is normal in structure. Tricuspid valve regurgitation is mild. Aortic Valve: The aortic valve was not well visualized. Aortic valve regurgitation is not visualized. No  aortic stenosis is present. Pulmonic Valve: The pulmonic valve was normal in structure. Pulmonic valve regurgitation is not visualized. Aorta: The aortic root is normal in size and structure. Venous: The inferior vena cava is normal in size with greater than 50% respiratory variability, suggesting right atrial pressure of 3 mmHg. IAS/Shunts: No atrial level shunt detected by color flow Doppler.  LEFT VENTRICLE PLAX 2D LVIDd:         4.70 cm   Diastology LVIDs:         3.50 cm   LV e' medial:    5.66 cm/s LV PW:         1.00 cm   LV E/e' medial:  18.5 LV IVS:        1.20 cm   LV e' lateral:   8.24 cm/s LVOT diam:     1.60 cm   LV E/e' lateral: 12.7 LV SV:         30 LV SV Index:   16 LVOT Area:     2.01 cm                           3D Volume EF:                          3D EF:        41 %                          LV EDV:       108 ml                          LV ESV:       63 ml                          LV SV:        44 ml RIGHT VENTRICLE RV Basal diam:  3.90 cm RV Mid diam:    2.30 cm RV S prime:  6.73 cm/s TAPSE (M-mode): 0.8 cm LEFT ATRIUM             Index        RIGHT ATRIUM           Index LA diam:        4.00 cm 2.21 cm/m   RA Area:     13.10 cm LA Vol (A2C):   46.4 ml 25.67 ml/m  RA Volume:   27.20 ml  15.05 ml/m LA Vol (A4C):   65.5 ml 36.24 ml/m LA Biplane Vol: 56.8 ml 31.43 ml/m  AORTIC VALVE LVOT Vmax:   74.40 cm/s LVOT Vmean:  54.900 cm/s LVOT VTI:    0.147 m  AORTA Ao Root diam: 2.60 cm MITRAL VALVE                TRICUSPID VALVE MV Area (PHT): 5.19 cm     TR Peak grad:   37.2 mmHg MV Decel Time: 146 msec     TR Vmax:        305.00 cm/s MV E velocity: 104.88 cm/s                             SHUNTS                             Systemic VTI:  0.15 m                             Systemic Diam: 1.60 cm Dalton McleanMD Electronically signed by Franki Monte Signature Date/Time: 02/07/2022/5:23:48 PM    Final    DG Chest 2 View  Result Date: 02/06/2022 CLINICAL DATA:  Shortness of breath for 1  month. EXAM: CHEST - 2 VIEW COMPARISON:  Chest/rib radiographs 10/11/2015 FINDINGS: The cardiac silhouette is borderline enlarged. The lungs are hyperinflated with mild chronic peribronchial thickening. No acute airspace consolidation, edema, pleural effusion, or pneumothorax is identified. No acute osseous abnormality is seen. IMPRESSION: Chronic bronchitic changes. No evidence of acute cardiopulmonary process. Electronically Signed   By: Logan Bores M.D.   On: 02/06/2022 13:43        Scheduled Meds:  apixaban  5 mg Oral BID   metoprolol tartrate  37.5 mg Oral BID   pantoprazole  40 mg Oral Daily   sodium chloride flush  3 mL Intravenous Q12H   Continuous Infusions:   LOS: 1 day    Time spent: over 30 min     Fayrene Helper, MD [facility]   To contact the attending provider between 7A-7P or the covering provider during after hours 7P-7A, please log into the web site [email or web address] and access using universal [place] password for that web site. If you do not have the password, please call the hospital operator.  02/07/2022, 5:59 PM

## 2022-02-07 NOTE — Telephone Encounter (Signed)
Pharmacy Patient Advocate Encounter  Insurance verification completed.    The patient is insured through [employer]    The patient is currently admitted and ran test claims for the following: Eliquis.  Copays and coinsurance results were relayed to Inpatient clinical team.

## 2022-02-07 NOTE — H&P (Incomplete)
Chief Complaint: " I have shortness of breath."  History of Present Illness (HPI) Bianca Wolf, a 81 year old female, has a pmHx of GERD and HTN presents to [facility] ED with shortness of breath.Patient has not been within her normal state of health since May 2023 when she was diagnosed with lumbar compression fractures currently following with [facility]. Recently she went back to work and fell over a plastic bin but recovered from fall. Patient states that she was going to see her PCP for a medication change, noted that her HR was elevated, and recommended her to go to [facility] ED. Patient states that shortness of breath has been on going for 3-4 weeks and over the last week becoming worse. Patient endorses that shortness of breath is with/without exertion and the only relieving factor is rest. Patient denies any substernal chest pain, cough, fever, chills and hemoptysis.     Past Medical History (PMH):   Medical Hx: GERD HTN   Surgical HX None    Psychiatric Hx:  None   o Over the past 2 weeks, have you felt down, depressed, or hopeless? No   o Over the past 2 weeks have you felt little interest in doing things? No    Allergies:  None    Current Medication Hx:  Benzapril 20mg , PO, Daily Calcitrate 200mg  PO, Daily  Iron Supplements 325mg , PO, BID Not taking currently  Omeprazole 20mg , PO, Daily Prolia Injection 60mg /74months in PCP office Vitamin D3 5000 Units, PO, Daily   Social History:   Tobacco Use: 35 year pack smoking history  Alcohol Use: None  Illicit Substance/Drug Use: None  Current Household: Lives Alone  Occupation: Works at [employer]: No recent travel over the last 2 weeks    Family History:  Mother (Deceased at 65 from Stroke), DM, Obesity Father (Deceased at 58 from Prostate CA), CABG   ROS:  General: Negative for fever, chills, or fatigue. Negative for changes in weight.  HEENT:  Negative for visual changes but positive for prescription. Negative  for nasal congestion, hearing changes, swallowing issues.   Neuro: Negative for numbness or tingling sensations, sensation loss.  Pulm: Positive for occasional cough, and shortness of breath. Negative for wheezing and hemoptysis.  CV: Negative for chest pain, positive for peripheral edema in lower extremities.  GI/GU: Negative for heartburn, dysphagia, changes in bowel habits, or presence of blood in stool. Positive for difficulty swallowing. Negative for changes in urination, frequency with urination, negative for presence of hematuria or blood in urine.  Heme/Onc/: Negative for abnormal bleeding or bruising.  Endo: Negative for polyuria, polydipsia, polyphagia, heat or cold intolerances.   MSK / Skin: Negative for changes in skin, or presences of rashes or blisters. Negative decrease ROM in other joints of extremities bilaterally. Negative for pain in all joints and extremities except joints at both knees.  Psych: Negative for anxiety and depression. Negative for thoughts of self harm or suicide.  Physical Examination (PE):  Vital Signs 0700 Temp ~0813 98.0 F  BP 143/71 (93) HR 72 RR 23 Pulse Ox 94% RA    General Appearance: No apparent distress and cooperative.   HEENT:  Head: Normocephalic.Eyes: PERRLA present.  EOM of 6 cardinal gazes intact. Ears: No hearing loss detected. Nose: Symmetric and midline within the face. Throat/Mouth: Lips of the mouth are pink, upper and lower dentures present. Mucous membranes pink and moist.   Neck: The trachea is midline, no masses present. lymph node chains are freely  movable and not fixed. No evidence of JVD.  Cardiovascular: No murmurs, gallops, rubs, thrills, heaves or lifts present. Radial and posterior tibial, and pedal pulses are irregular and can be graded 2+ bilaterally. No Bilateral edema present with upper or lower extremities.   Respiratory:  Breath sounds clear in upper but diminished in lower lung fields. No tachypnea,  increase WOB, or accessory muscle use.  Musculoskeletal: Joints of the fingers, hands, wrist, elbows, hips, knees, ankles, toes have symmetrical equal ROM and no presence of redness, swelling, deformities, tenderness. Muscle strength is equal upon hand griping bilaterally, 5/5.  Integumentary: Hair is distributed evenly. No presence, rashes, sores, bruising, lesions. Skin color appropriate for ethnicity and no presence of pallor, redness, or cyanosis throughout.  Gastrointestinal: the abdomen is symmetric with no distention, skin appears normal with no presence of rashes or lesions. Bowel sounds active in all quadrants. No masses or pain present palpating.   Genitourinary: Deferred by patient.   Neurologic: Alert and oriented x 4. Attention, judgement, and memory are well intact. Follows and performs commands with no difficulty. Cranial nerves II to XII intact.  Psych: No anxiety or depression. Mood pleasant.   Labs:  10/18 Na 139, K 3.8, Cl 103, CO2 29, Glucose 122, BUN 8, Creatinine 0.75, Calcium 9.0, Anion Gap7, GFR >60  CBC-WBC 8.3, RBC 3.71, Hgb 11.2, HCT 35.1, MCH 30.2, Platelets 241  10/17 TSH 1.223  Imaging and Diagnostics EKG 10/17-10/18 Atrial flutter   Chest X-ray- No acute abnormality noted, Chronic Bronchitis evident   Echo results pending   Assessment and Plan   Bianca Wolf, 81 yr old female with pmhx of HTN and GERD with complaints of SOB.   Shortness of Breath  Ddx: Heart failure, Afib/Aflutter Pneumonia, PE Shortness ongoing for approximately one month. TSH ordered and within range. Atrial Flutter noted on EKG. Admit to Cardiac Progressive with Telemetry Consult Cardiology  CHADVASC Score 4, eliquis 5mg  BID ordered for anticoagulation  Cardizem gtt started and continued to control rate, Transition over to PO Metoprolol with Cardiology Assistance.   HTN hx Hold home medication Benzapril 20mg  tomorrow once transitioned off Cardizem gtt, start Metoprolol BID Monitor  vital signs q2-4hrs   GERD Hx Continue PPI, Protonix 40mg  PO once daily   Osteoporosis/Osteopenia hx Continue Vitamin D3 Daily and Calcium Supplementation Daily

## 2022-02-07 NOTE — TOC Benefit Eligibility Note (Addendum)
Patient Teacher, English as a foreign language completed.    The patient is currently admitted and upon discharge could be taking Eliquis 5mg .  The current 30 day co-pay is $45.00.   The patient is insured through [employer].    [place]

## 2022-02-08 DIAGNOSIS — N179 Acute kidney failure, unspecified: Secondary | ICD-10-CM

## 2022-02-08 DIAGNOSIS — I4891 Unspecified atrial fibrillation: Secondary | ICD-10-CM | POA: Diagnosis not present

## 2022-02-08 DIAGNOSIS — I5021 Acute systolic (congestive) heart failure: Secondary | ICD-10-CM | POA: Diagnosis not present

## 2022-02-08 LAB — COMPREHENSIVE METABOLIC PANEL
ALT: 13 U/L (ref 0–44)
AST: 16 U/L (ref 15–41)
Albumin: 3.2 g/dL — ABNORMAL LOW (ref 3.5–5.0)
Alkaline Phosphatase: 91 U/L (ref 38–126)
Anion gap: 7 (ref 5–15)
BUN: 14 mg/dL (ref 8–23)
CO2: 28 mmol/L (ref 22–32)
Calcium: 9.5 mg/dL (ref 8.9–10.3)
Chloride: 102 mmol/L (ref 98–111)
Creatinine, Ser: 1.16 mg/dL — ABNORMAL HIGH (ref 0.44–1.00)
GFR, Estimated: 47 mL/min — ABNORMAL LOW (ref >60)
Glucose, Bld: 109 mg/dL — ABNORMAL HIGH (ref 70–99)
Potassium: 5.3 mmol/L — ABNORMAL HIGH (ref 3.5–5.1)
Sodium: 137 mmol/L (ref 135–145)
Total Bilirubin: 0.5 mg/dL (ref 0.3–1.2)
Total Protein: 6.7 g/dL (ref 6.5–8.1)

## 2022-02-08 LAB — BASIC METABOLIC PANEL
Anion gap: 10 (ref 5–15)
BUN: 16 mg/dL (ref 8–23)
CO2: 26 mmol/L (ref 22–32)
Calcium: 9.2 mg/dL (ref 8.9–10.3)
Chloride: 101 mmol/L (ref 98–111)
Creatinine, Ser: 1.17 mg/dL — ABNORMAL HIGH (ref 0.44–1.00)
GFR, Estimated: 47 mL/min — ABNORMAL LOW (ref >60)
Glucose, Bld: 140 mg/dL — ABNORMAL HIGH (ref 70–99)
Potassium: 4.6 mmol/L (ref 3.5–5.1)
Sodium: 137 mmol/L (ref 135–145)

## 2022-02-08 LAB — CBC WITH DIFFERENTIAL/PLATELET
Abs Immature Granulocytes: 0.04 10*3/uL (ref 0.00–0.07)
Basophils Absolute: 0 10*3/uL (ref 0.0–0.1)
Basophils Relative: 0 %
Eosinophils Absolute: 0.2 10*3/uL (ref 0.0–0.5)
Eosinophils Relative: 2 %
HCT: 37.9 % (ref 36.0–46.0)
Hemoglobin: 12.1 g/dL (ref 12.0–15.0)
Immature Granulocytes: 1 %
Lymphocytes Relative: 20 %
Lymphs Abs: 1.5 10*3/uL (ref 0.7–4.0)
MCH: 30 pg (ref 26.0–34.0)
MCHC: 31.9 g/dL (ref 30.0–36.0)
MCV: 93.8 fL (ref 80.0–100.0)
Monocytes Absolute: 0.6 10*3/uL (ref 0.1–1.0)
Monocytes Relative: 8 %
Neutro Abs: 5.1 10*3/uL (ref 1.7–7.7)
Neutrophils Relative %: 69 %
Platelets: 259 10*3/uL (ref 150–400)
RBC: 4.04 MIL/uL (ref 3.87–5.11)
RDW: 12.9 % (ref 11.5–15.5)
WBC: 7.5 10*3/uL (ref 4.0–10.5)
nRBC: 0 % (ref 0.0–0.2)

## 2022-02-08 LAB — PHOSPHORUS: Phosphorus: 3.5 mg/dL (ref 2.5–4.6)

## 2022-02-08 LAB — PROTIME-INR
INR: 1.2 (ref 0.8–1.2)
Prothrombin Time: 15.1 seconds (ref 11.4–15.2)

## 2022-02-08 LAB — MAGNESIUM: Magnesium: 1.8 mg/dL (ref 1.7–2.4)

## 2022-02-08 MED ORDER — METOPROLOL TARTRATE 50 MG PO TABS
50.0000 mg | ORAL_TABLET | Freq: Two times a day (BID) | ORAL | Status: DC
  Administered 2022-02-08 – 2022-02-09 (×3): 50 mg via ORAL
  Filled 2022-02-08 (×2): qty 1
  Filled 2022-02-08: qty 2

## 2022-02-08 MED ORDER — SODIUM CHLORIDE 0.9 % IV SOLN: INTRAVENOUS | Status: DC

## 2022-02-08 NOTE — H&P (View-Only) (Signed)
Rounding Note    Patient Name: Bianca Wolf Date of Encounter: 02/08/2022  [place] Cardiologist: None   Subjective   BP 134/92.  I/Os not recorded.  Cr increased (0.75>1.16), potassium 5.3.  Rates increased overnight but appears her PM metoprolol dose was held.  Denies any chest pain or dyspnea  Inpatient Medications    Scheduled Meds:  apixaban  5 mg Oral BID   metoprolol tartrate  37.5 mg Oral BID   pantoprazole  40 mg Oral Daily   sodium chloride flush  3 mL Intravenous Q12H   Continuous Infusions:   PRN Meds: acetaminophen **OR** acetaminophen, hydrALAZINE, ondansetron **OR** ondansetron (ZOFRAN) IV   Vital Signs    Vitals:   02/08/22 0300 02/08/22 0600 02/08/22 0702 02/08/22 0735  BP: 131/82 (!) 132/112  (!) 134/92  Pulse: (!) 122 (!) 124  (!) 124  Resp: (!) 22   18  Temp:   (!) 97.5 F (36.4 C)   TempSrc:   Oral   SpO2: 92% 93%  91%  Weight:      Height:       No intake or output data in the 24 hours ending 02/08/22 0756     02/06/2022   12:58 PM 12/21/2021    2:31 PM 11/27/2021    1:16 PM  Last 3 Weights  Weight (lbs) 166 lb 161 lb 12.8 oz 160 lb 12.8 oz  Weight (kg) 75.297 kg 73.392 kg 72.938 kg      Telemetry    AFL rates 120s- Personally Reviewed  ECG    No new ECG - Personally Reviewed  Physical Exam   GEN: No acute distress.   Neck: No JVD Cardiac: irregular, tachycardic, no murmurs, rubs, or gallops.  Respiratory: Clear to auscultation bilaterally. GI: Soft, nontender, non-distended  MS: No edema; No deformity. Neuro:  Nonfocal  Psych: Normal affect   Labs    High Sensitivity Troponin:  No results for input(s): "TROPONINIHS" in the last 720 hours.   Chemistry Recent Labs  Lab 02/06/22 1327 02/06/22 1531 02/07/22 0129 02/08/22 0335  NA 140  --  139 137  K 3.5  --  3.8 5.3*  CL 103  --  103 102  CO2 26  --  29 28  GLUCOSE 89  --  122* 109*  BUN 9  --  8 14  CREATININE 0.76  --  0.75 1.16*  CALCIUM 9.4   --  9.0 9.5  MG  --  1.7  --  1.8  PROT  --   --   --  6.7  ALBUMIN  --   --   --  3.2*  AST  --   --   --  16  ALT  --   --   --  13  ALKPHOS  --   --   --  91  BILITOT  --   --   --  0.5  GFRNONAA >60  --  >60 47*  ANIONGAP 11  --  7 7     Lipids No results for input(s): "CHOL", "TRIG", "HDL", "LABVLDL", "LDLCALC", "CHOLHDL" in the last 168 hours.  Hematology Recent Labs  Lab 02/06/22 1327 02/07/22 0129 02/08/22 0335  WBC 8.0 8.3 7.5  RBC 3.98 3.71* 4.04  HGB 11.7* 11.2* 12.1  HCT 38.0 35.1* 37.9  MCV 95.5 94.6 93.8  MCH 29.4 30.2 30.0  MCHC 30.8 31.9 31.9  RDW 12.9 12.9 12.9  PLT 270 241 259  Thyroid  Recent Labs  Lab 02/06/22 1327  TSH 1.223     BNPNo results for input(s): "BNP", "PROBNP" in the last 168 hours.  DDimer No results for input(s): "DDIMER" in the last 168 hours.   Radiology    ECHOCARDIOGRAM COMPLETE  Result Date: 02/07/2022    ECHOCARDIOGRAM REPORT   Patient Name:   Bianca Wolf Date of Exam: 02/07/2022 Medical Rec #:  [id number]      Height:       64.0 in Accession #:    [id number]     Weight:       166.0 lb Date of Birth:  1940-08-21      BSA:          1.807 m Patient Age:    81 years       BP:           137/89 mmHg Patient Gender: F              HR:           85 bpm. Exam Location:  Inpatient Procedure: 2D Echo, Color Doppler, Cardiac Doppler and 3D Echo Indications:    Atrial Fibrillation I48.91  History:        Patient has no prior history of Echocardiogram examinations.                 Arrythmias:Palpitations.; Risk Factors:Hypertension.  Sonographer:    Darlina Sicilian RDCS Referring Phys: [place]  1. Left ventricular ejection fraction, by estimation, is 45 to 50%. The left ventricle has mildly decreased function. The left ventricle demonstrates global hypokinesis. There is mild concentric left ventricular hypertrophy. Left ventricular diastolic parameters are indeterminate.  2. Right ventricular systolic function is  mildly reduced. The right ventricular size is normal. There is mildly elevated pulmonary artery systolic pressure. The estimated right ventricular systolic pressure is 24.0 mmHg.  3. Left atrial size was mildly dilated.  4. The mitral valve is normal in structure. No evidence of mitral valve regurgitation. No evidence of mitral stenosis.  5. The aortic valve was not well visualized. Aortic valve regurgitation is not visualized. No aortic stenosis is present.  6. The inferior vena cava is normal in size with greater than 50% respiratory variability, suggesting right atrial pressure of 3 mmHg.  7. Technically difficult study with poor acoustic windows. FINDINGS  Left Ventricle: Left ventricular ejection fraction, by estimation, is 45 to 50%. The left ventricle has mildly decreased function. The left ventricle demonstrates global hypokinesis. The left ventricular internal cavity size was normal in size. There is  mild concentric left ventricular hypertrophy. Left ventricular diastolic parameters are indeterminate. Right Ventricle: The right ventricular size is normal. No increase in right ventricular wall thickness. Right ventricular systolic function is mildly reduced. There is mildly elevated pulmonary artery systolic pressure. The tricuspid regurgitant velocity  is 3.05 m/s, and with an assumed right atrial pressure of 3 mmHg, the estimated right ventricular systolic pressure is 97.3 mmHg. Left Atrium: Left atrial size was mildly dilated. Right Atrium: Right atrial size was normal in size. Pericardium: There is no evidence of pericardial effusion. Mitral Valve: The mitral valve is normal in structure. No evidence of mitral valve regurgitation. No evidence of mitral valve stenosis. Tricuspid Valve: The tricuspid valve is normal in structure. Tricuspid valve regurgitation is mild. Aortic Valve: The aortic valve was not well visualized. Aortic valve regurgitation is not visualized. No aortic stenosis is present.  Pulmonic Valve: The pulmonic valve was  normal in structure. Pulmonic valve regurgitation is not visualized. Aorta: The aortic root is normal in size and structure. Venous: The inferior vena cava is normal in size with greater than 50% respiratory variability, suggesting right atrial pressure of 3 mmHg. IAS/Shunts: No atrial level shunt detected by color flow Doppler.  LEFT VENTRICLE PLAX 2D LVIDd:         4.70 cm   Diastology LVIDs:         3.50 cm   LV e' medial:    5.66 cm/s LV PW:         1.00 cm   LV E/e' medial:  18.5 LV IVS:        1.20 cm   LV e' lateral:   8.24 cm/s LVOT diam:     1.60 cm   LV E/e' lateral: 12.7 LV SV:         30 LV SV Index:   16 LVOT Area:     2.01 cm                           3D Volume EF:                          3D EF:        41 %                          LV EDV:       108 ml                          LV ESV:       63 ml                          LV SV:        44 ml RIGHT VENTRICLE RV Basal diam:  3.90 cm RV Mid diam:    2.30 cm RV S prime:     6.73 cm/s TAPSE (M-mode): 0.8 cm LEFT ATRIUM             Index        RIGHT ATRIUM           Index LA diam:        4.00 cm 2.21 cm/m   RA Area:     13.10 cm LA Vol (A2C):   46.4 ml 25.67 ml/m  RA Volume:   27.20 ml  15.05 ml/m LA Vol (A4C):   65.5 ml 36.24 ml/m LA Biplane Vol: 56.8 ml 31.43 ml/m  AORTIC VALVE LVOT Vmax:   74.40 cm/s LVOT Vmean:  54.900 cm/s LVOT VTI:    0.147 m  AORTA Ao Root diam: 2.60 cm MITRAL VALVE                TRICUSPID VALVE MV Area (PHT): 5.19 cm     TR Peak grad:   37.2 mmHg MV Decel Time: 146 msec     TR Vmax:        305.00 cm/s MV E velocity: 104.88 cm/s                             SHUNTS  Systemic VTI:  0.15 m                             Systemic Diam: 1.60 cm Dalton McleanMD Electronically signed by Franki Monte Signature Date/Time: 02/07/2022/5:23:48 PM    Final    DG Chest 2 View  Result Date: 02/06/2022 CLINICAL DATA:  Shortness of breath for 1 month. EXAM: CHEST - 2 VIEW  COMPARISON:  Chest/rib radiographs 10/11/2015 FINDINGS: The cardiac silhouette is borderline enlarged. The lungs are hyperinflated with mild chronic peribronchial thickening. No acute airspace consolidation, edema, pleural effusion, or pneumothorax is identified. No acute osseous abnormality is seen. IMPRESSION: Chronic bronchitic changes. No evidence of acute cardiopulmonary process. Electronically Signed   By: Logan Bores M.D.   On: 02/06/2022 13:43    Cardiac Studies     Patient Profile     81 y.o. female with history of hypertension who presents with new onset atrial fibrillation.  Assessment & Plan    Atrial fibrillation/flutter: New diagnosis.  CHA2DS2-VASc score 4 (hypertension, age x2, female) -Continue Eliquis 5 mg twice daily -Was started on diltiazem drip as well as metoprolol 25 mg twice daily for rate control on admission.  Weaned off diltiazem 10/18.  Rates were reasonably controlled on metoprolol but now appears to have organized into an atrial flutter, rates 120s.  Atrial flutter can be difficult to rate control, and considering her new systolic dysfunction, recommend TEE/DDCV.  Will increase metoprolol to 50 mg twice daily.  Plan TEE/DCCV tomorrow  Acute systolic heart failure: EF 45 to 50% on echo 10/18.  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  Mildly volume overloaded on exam on admission, was given dose of IV Lasix 20 mg, currently appears euvolemic.  Mild bump in creatinine, will hold further diuresis  Hypertension: Hold home benazepril while titrating metoprolol as above  AKI: mild bump in Cr (0.75>1.16), will hold off on further diuresis for now as above   For questions or updates, please contact [place] Please consult [email or web address] for contact info under        Signed, Donato Heinz, MD  02/08/2022, 7:56 AM

## 2022-02-08 NOTE — Progress Notes (Signed)
PROGRESS NOTE    Bianca Wolf  [id number] DOB: 1940-10-23 DOA: 02/06/2022 PCP: [name], MD  Chief Complaint  Patient presents with   Atrial Fibrillation    Brief Narrative:  Bianca Wolf is Bianca Wolf 81 y.o. female with medical history significant of HTN presenting with palpitations. She reports that she was supposed to go check on her Prolia today - has to do an IV because it is cheaper ($50 vs. 287).  She also planned to see Dr. [name] in the morning for SOB for the last month.  It comes and goes, worse with exertion.  She has been out of work for back trouble for about 3 months, but she finally went back to work at [employer] and fell over something 2 days later and bruised her chest 3-4 weeks ago.  She is still working despite the fall - 4 hours Maeve Debord day 4 days Navah Grondin week.  She does not feel palpitations or her heart racing.     Assessment & Plan:   Principal Problem:   Atrial fibrillation with RVR (HCC) Active Problems:   Age-related osteoporosis without current pathological fracture   Essential hypertension   Atrial fibrillation (HCC)   Assessment and Plan: New onset Afib with RVR  -Patient presenting with new-onset afib.  -TSH wnl.  Related to longstanding HTN? -echo with EF 45-50%, mildly reduced RVSF, mildly elevated PASP -CHA2DS2-VASc Score is at least 4 (age, sex, htn)  -continue eliquis  -continue titrating metop as tolerated - appreciate cards assistance, TEE/DCCV tomorrow   AKI  - mild, continue to monitor  Heart Failure Exacerbation - mildly reduced EF - seems euvolemic  - per cards  HTN -Hold benazepril -follow with metoprolol   Osteoporosis -On Prolia -h/o compression fracture -She is looking into more cost-effective treatment with her PCP    DVT prophylaxis: eliquis Code Status: full Family Communication: none Disposition:   Status is: Inpatient Remains inpatient appropriate because: need for continued cardiology evaluation   Consultants:   cardiology  Procedures:  Echo IMPRESSIONS     1. Left ventricular ejection fraction, by estimation, is 45 to 50%. The  left ventricle has mildly decreased function. The left ventricle  demonstrates global hypokinesis. There is mild concentric left ventricular  hypertrophy. Left ventricular diastolic  parameters are indeterminate.   2. Right ventricular systolic function is mildly reduced. The right  ventricular size is normal. There is mildly elevated pulmonary artery  systolic pressure. The estimated right ventricular systolic pressure is  Q000111Q mmHg.   3. Left atrial size was mildly dilated.   4. The mitral valve is normal in structure. No evidence of mitral valve  regurgitation. No evidence of mitral stenosis.   5. The aortic valve was not well visualized. Aortic valve regurgitation  is not visualized. No aortic stenosis is present.   6. The inferior vena cava is normal in size with greater than 50%  respiratory variability, suggesting right atrial pressure of 3 mmHg.   7. Technically difficult study with poor acoustic windows.   Antimicrobials:  Anti-infectives (From admission, onward)    None       Subjective: Feeling better  Objective: Vitals:   02/08/22 0735 02/08/22 1139 02/08/22 1300 02/08/22 1731  BP: (!) 134/92 118/80 125/69 136/79  Pulse: (!) 124 (!) 115 (!) 104 (!) 119  Resp: 18 18  18   Temp:  97.7 F (36.5 C) 97.8 F (36.6 C) 97.7 F (36.5 C)  TempSrc:  Oral Oral Oral  SpO2: 91% 96%  94% 94%  Weight:      Height:        Intake/Output Summary (Last 24 hours) at 02/08/2022 1758 Last data filed at 02/08/2022 1700 Gross per 24 hour  Intake 107.73 ml  Output --  Net 107.73 ml   Filed Weights   02/06/22 1258  Weight: 75.3 kg    Examination:  General: No acute distress. Cardiovascular: irregularly irreuglar  Lungs:  unlabored Abdomen: Soft, nontender, nondistended  Neurological: Alert and oriented 3. Moves all extremities 4 with equal  strength. Cranial nerves II through XII grossly intact. Extremities: No clubbing or cyanosis. No edema.   Data Reviewed: I have personally reviewed following labs and imaging studies  CBC: Recent Labs  Lab 02/06/22 1327 02/07/22 0129 02/08/22 0335  WBC 8.0 8.3 7.5  NEUTROABS  --   --  5.1  HGB 11.7* 11.2* 12.1  HCT 38.0 35.1* 37.9  MCV 95.5 94.6 93.8  PLT 270 241 [id number]    Basic Metabolic Panel: Recent Labs  Lab 02/06/22 1327 02/06/22 1531 02/07/22 0129 02/08/22 0335 02/08/22 1501  NA 140  --  139 137 137  K 3.5  --  3.8 5.3* 4.6  CL 103  --  103 102 101  CO2 26  --  29 28 26   GLUCOSE 89  --  122* 109* 140*  BUN 9  --  8 14 16   CREATININE 0.76  --  0.75 1.16* 1.17*  CALCIUM 9.4  --  9.0 9.5 9.2  MG  --  1.7  --  1.8  --   PHOS  --   --   --  3.5  --     GFR: Estimated Creatinine Clearance: 36.8 mL/min (Anise Harbin) (by C-G formula based on SCr of 1.17 mg/dL (H)).  Liver Function Tests: Recent Labs  Lab 02/08/22 0335  AST 16  ALT 13  ALKPHOS 91  BILITOT 0.5  PROT 6.7  ALBUMIN 3.2*    CBG: No results for input(s): "GLUCAP" in the last 168 hours.   No results found for this or any previous visit (from the past 240 hour(s)).       Radiology Studies: ECHOCARDIOGRAM COMPLETE  Result Date: 02/07/2022    ECHOCARDIOGRAM REPORT   Patient Name:   Bianca Wolf Date of Exam: 02/07/2022 Medical Rec #:  [id number]      Height:       64.0 in Accession #:    [id number]     Weight:       166.0 lb Date of Birth:  05/29/1940      BSA:          1.807 m Patient Age:    72 years       BP:           137/89 mmHg Patient Gender: F              HR:           85 bpm. Exam Location:  Inpatient Procedure: 2D Echo, Color Doppler, Cardiac Doppler and 3D Echo Indications:    Atrial Fibrillation I48.91  History:        Patient has no prior history of Echocardiogram examinations.                 Arrythmias:Palpitations.; Risk Factors:Hypertension.  Sonographer:    Darlina Sicilian RDCS Referring  Phys: [place]  1. Left ventricular ejection fraction, by estimation, is 45 to 50%. The left ventricle has mildly decreased  function. The left ventricle demonstrates global hypokinesis. There is mild concentric left ventricular hypertrophy. Left ventricular diastolic parameters are indeterminate.  2. Right ventricular systolic function is mildly reduced. The right ventricular size is normal. There is mildly elevated pulmonary artery systolic pressure. The estimated right ventricular systolic pressure is Q000111Q mmHg.  3. Left atrial size was mildly dilated.  4. The mitral valve is normal in structure. No evidence of mitral valve regurgitation. No evidence of mitral stenosis.  5. The aortic valve was not well visualized. Aortic valve regurgitation is not visualized. No aortic stenosis is present.  6. The inferior vena cava is normal in size with greater than 50% respiratory variability, suggesting right atrial pressure of 3 mmHg.  7. Technically difficult study with poor acoustic windows. FINDINGS  Left Ventricle: Left ventricular ejection fraction, by estimation, is 45 to 50%. The left ventricle has mildly decreased function. The left ventricle demonstrates global hypokinesis. The left ventricular internal cavity size was normal in size. There is  mild concentric left ventricular hypertrophy. Left ventricular diastolic parameters are indeterminate. Right Ventricle: The right ventricular size is normal. No increase in right ventricular wall thickness. Right ventricular systolic function is mildly reduced. There is mildly elevated pulmonary artery systolic pressure. The tricuspid regurgitant velocity  is 3.05 m/s, and with an assumed right atrial pressure of 3 mmHg, the estimated right ventricular systolic pressure is Q000111Q mmHg. Left Atrium: Left atrial size was mildly dilated. Right Atrium: Right atrial size was normal in size. Pericardium: There is no evidence of pericardial effusion. Mitral  Valve: The mitral valve is normal in structure. No evidence of mitral valve regurgitation. No evidence of mitral valve stenosis. Tricuspid Valve: The tricuspid valve is normal in structure. Tricuspid valve regurgitation is mild. Aortic Valve: The aortic valve was not well visualized. Aortic valve regurgitation is not visualized. No aortic stenosis is present. Pulmonic Valve: The pulmonic valve was normal in structure. Pulmonic valve regurgitation is not visualized. Aorta: The aortic root is normal in size and structure. Venous: The inferior vena cava is normal in size with greater than 50% respiratory variability, suggesting right atrial pressure of 3 mmHg. IAS/Shunts: No atrial level shunt detected by color flow Doppler.  LEFT VENTRICLE PLAX 2D LVIDd:         4.70 cm   Diastology LVIDs:         3.50 cm   LV e' medial:    5.66 cm/s LV PW:         1.00 cm   LV E/e' medial:  18.5 LV IVS:        1.20 cm   LV e' lateral:   8.24 cm/s LVOT diam:     1.60 cm   LV E/e' lateral: 12.7 LV SV:         30 LV SV Index:   16 LVOT Area:     2.01 cm                           3D Volume EF:                          3D EF:        41 %                          LV EDV:       108 ml  LV ESV:       63 ml                          LV SV:        44 ml RIGHT VENTRICLE RV Basal diam:  3.90 cm RV Mid diam:    2.30 cm RV S prime:     6.73 cm/s TAPSE (M-mode): 0.8 cm LEFT ATRIUM             Index        RIGHT ATRIUM           Index LA diam:        4.00 cm 2.21 cm/m   RA Area:     13.10 cm LA Vol (A2C):   46.4 ml 25.67 ml/m  RA Volume:   27.20 ml  15.05 ml/m LA Vol (A4C):   65.5 ml 36.24 ml/m LA Biplane Vol: 56.8 ml 31.43 ml/m  AORTIC VALVE LVOT Vmax:   74.40 cm/s LVOT Vmean:  54.900 cm/s LVOT VTI:    0.147 m  AORTA Ao Root diam: 2.60 cm MITRAL VALVE                TRICUSPID VALVE MV Area (PHT): 5.19 cm     TR Peak grad:   37.2 mmHg MV Decel Time: 146 msec     TR Vmax:        305.00 cm/s MV E velocity: 104.88 cm/s                              SHUNTS                             Systemic VTI:  0.15 m                             Systemic Diam: 1.60 cm Dalton McleanMD Electronically signed by Franki Monte Signature Date/Time: 02/07/2022/5:23:48 PM    Final         Scheduled Meds:  apixaban  5 mg Oral BID   metoprolol tartrate  50 mg Oral BID   pantoprazole  40 mg Oral Daily   sodium chloride flush  3 mL Intravenous Q12H   Continuous Infusions:  sodium chloride       LOS: 2 days    Time spent: over 30 min     Fayrene Helper, MD [facility]   To contact the attending provider between 7A-7P or the covering provider during after hours 7P-7A, please log into the web site [email or web address] and access using universal [place] password for that web site. If you do not have the password, please call the hospital operator.  02/08/2022, 5:58 PM

## 2022-02-08 NOTE — Care Management (Signed)
  Transition of Care [facility]) Screening Note   Patient Details  Name: CHYLER CREELY Date of Birth: [birth year]   Transition of Care [facility]) CM/SW Contact:    Bethena Roys, RN Phone Number: 02/08/2022, 2:23 PM    Transition of Care Department [facility]) has reviewed the patient. Case Manager received a consult for checking cost for [place]. Benefits check submitted and Case Manager will follow for cost. Case Manager will continue to monitor patient advancement through interdisciplinary progression rounds. If new patient transition needs arise, please place a TOC consult.

## 2022-02-08 NOTE — Progress Notes (Incomplete)
Summary Statement  81 year old female, has a pmHx of HTN and GERD presents to [facility] ED with shortness of breath and found to have new onset of atrial flutter.                                                                                      24 Hour Events:  No major events overnight, patient still in atrial flutter, rates varying from 80s-130s     SUBJECTIVE: Patient states that shortness of breath is more controlled but still fatigued.    VS trends: BP:       Systolic 95 Diastolic 60  024 097  Pulse: 83  125  Respiratory Rate: 16 22  Temp: 97.5 98.2   SpO2:  91 99  Weight 75.3 kg    ? Ins: No documentation    Outs No documentation     Telemetry reading: Atrial Flutter  Ectopy present: Unifocal PVCS   PE (at least 8 body systems): HEENT:   Head: Normocephalic.Eyes: PERRLA present.  EOM of 6 cardinal gazes. Ears: Hearing intact. Nose/Throat/Mouth: Lips of the mouth are pink, upper and lower dentures present. Mucous membranes pink and moist.    Neck: The trachea is midline,  No evidence of JVD.   Cardiovascular: No murmurs, gallops, rubs, thrills, heaves or lifts present. S1 and S2 present, regular rhythm.  Radial and posterior tibial, and pedal pulses are irregular and can be graded 2+ bilaterally. No Bilateral edema present with upper or lower extremities.    Respiratory:  Breath sounds clear in upper but diminished in lower lung fields. No tachypnea, increase WOB, or accessory muscle use.  Musculoskeletal: Joints of the fingers, hands, wrist, elbows, hips, knees, ankles, toes have symmetrical equal ROM and no presence of redness, swelling, deformities, tenderness. Muscle strength is equal upon hand griping bilaterally, 5/5.   Integumentary:  No presence, rashes, sores, bruising, lesions. Skin color appropriate for ethnicity and no presence of pallor, redness, or cyanosis throughout.   Gastrointestinal: the abdomen is symmetric with no distention, skin appears normal with  no presence of rashes or lesions. Bowel sounds active in all quadrants. No masses or pain present palpating.    Genitourinary: Deferred by patient.    Neurologic: Alert and oriented x 4. Attention, judgement, and memory are well intact. Follows and performs commands with no difficulty. Cranial nerves II to XII intact.   Psych: Mood pleasant. Appropriate for situation and age.      Lab Results: BMET: Na 137, K 5.3, Cl 102, Co2 28, Glucose 109, BUN 14, Creat 1.16, Calcium 9.5, Anion Gap 7, Phosp 3.5, Mag, 1.8, ALP 91, Albumin 3.2, AST 16, ALT 13, Total Bili 0.5, GFR 37   CBC WBC 7.5, RBC 4.04, Hgb 12.1, HCT 37.9, MCV 93.8, Platelets 259   Imaging Results and Other Diagnostic Tests: 10/18 EKG ST with PVCs to Atrial flutter 2:1 ratio  10/18 ECHO Left ventricle: 45-50%, mild decrease in function. Also right ventricular systolic function is mildly reduced. No evidence of pericardial effusion or valvular disease.   Summary Statement  81 year old female, has a pmHx of HTN and GERD presents to [facility] ED  with shortness of breath and found to have new onset of atrial flutter with LEF from 45-50%.   Assessment and Plan  Atrial Flutter, with decreased LEF, SOB HR over the last 24hrs 80s-130s, patient having less SOB today in comparison to yesterday. Metoprolol was held overnight, Vital signs have been stable, continue to give metoprolol 50mg  BID, if patient's not controlled with metoprolol 50 BID, may need to increase dose or add prn metoprolol  LEF reduced to suspicion of tachycardia induced cardiomyopathy.  Cardiology consulted recommends TEE/DCCV on possibly Monday if rate not controlled but maybe able to discharge and follow up in 3 weeks for Cardioversion    Continue Eliquis 5mg  BID for anticoagulation   Elevated Cr  Creatinine increased from 0.75 to 1.16, hold lasix  Patient not showing signs of peripheral edema, patient making adequate urine per external urinary catheter  HTN Hx  Hold  Benzapril due to elevated Cr and slight hyperkalemia (K 5.3) Continue to give metoprolol 50mg  BID  GERD Continue to give Protonix 40mg  Daily

## 2022-02-08 NOTE — Progress Notes (Addendum)
Rounding Note    Patient Name: Bianca Wolf Date of Encounter: 02/08/2022  [place] Cardiologist: None   Subjective   BP 134/92.  I/Os not recorded.  Cr increased (0.75>1.16), potassium 5.3.  Rates increased overnight but appears her PM metoprolol dose was held.  Denies any chest pain or dyspnea  Inpatient Medications    Scheduled Meds:  apixaban  5 mg Oral BID   metoprolol tartrate  37.5 mg Oral BID   pantoprazole  40 mg Oral Daily   sodium chloride flush  3 mL Intravenous Q12H   Continuous Infusions:   PRN Meds: acetaminophen **OR** acetaminophen, hydrALAZINE, ondansetron **OR** ondansetron (ZOFRAN) IV   Vital Signs    Vitals:   02/08/22 0300 02/08/22 0600 02/08/22 0702 02/08/22 0735  BP: 131/82 (!) 132/112  (!) 134/92  Pulse: (!) 122 (!) 124  (!) 124  Resp: (!) 22   18  Temp:   (!) 97.5 F (36.4 C)   TempSrc:   Oral   SpO2: 92% 93%  91%  Weight:      Height:       No intake or output data in the 24 hours ending 02/08/22 0756     02/06/2022   12:58 PM 12/21/2021    2:31 PM 11/27/2021    1:16 PM  Last 3 Weights  Weight (lbs) 166 lb 161 lb 12.8 oz 160 lb 12.8 oz  Weight (kg) 75.297 kg 73.392 kg 72.938 kg      Telemetry    AFL rates 120s- Personally Reviewed  ECG    No new ECG - Personally Reviewed  Physical Exam   GEN: No acute distress.   Neck: No JVD Cardiac: irregular, tachycardic, no murmurs, rubs, or gallops.  Respiratory: Clear to auscultation bilaterally. GI: Soft, nontender, non-distended  MS: No edema; No deformity. Neuro:  Nonfocal  Psych: Normal affect   Labs    High Sensitivity Troponin:  No results for input(s): "TROPONINIHS" in the last 720 hours.   Chemistry Recent Labs  Lab 02/06/22 1327 02/06/22 1531 02/07/22 0129 02/08/22 0335  NA 140  --  139 137  K 3.5  --  3.8 5.3*  CL 103  --  103 102  CO2 26  --  29 28  GLUCOSE 89  --  122* 109*  BUN 9  --  8 14  CREATININE 0.76  --  0.75 1.16*  CALCIUM 9.4   --  9.0 9.5  MG  --  1.7  --  1.8  PROT  --   --   --  6.7  ALBUMIN  --   --   --  3.2*  AST  --   --   --  16  ALT  --   --   --  13  ALKPHOS  --   --   --  91  BILITOT  --   --   --  0.5  GFRNONAA >60  --  >60 47*  ANIONGAP 11  --  7 7     Lipids No results for input(s): "CHOL", "TRIG", "HDL", "LABVLDL", "LDLCALC", "CHOLHDL" in the last 168 hours.  Hematology Recent Labs  Lab 02/06/22 1327 02/07/22 0129 02/08/22 0335  WBC 8.0 8.3 7.5  RBC 3.98 3.71* 4.04  HGB 11.7* 11.2* 12.1  HCT 38.0 35.1* 37.9  MCV 95.5 94.6 93.8  MCH 29.4 30.2 30.0  MCHC 30.8 31.9 31.9  RDW 12.9 12.9 12.9  PLT 270 241 259  Thyroid  Recent Labs  Lab 02/06/22 1327  TSH 1.223     BNPNo results for input(s): "BNP", "PROBNP" in the last 168 hours.  DDimer No results for input(s): "DDIMER" in the last 168 hours.   Radiology    ECHOCARDIOGRAM COMPLETE  Result Date: 02/07/2022    ECHOCARDIOGRAM REPORT   Patient Name:   Bianca Wolf Date of Exam: 02/07/2022 Medical Rec #:  [id number]      Height:       64.0 in Accession #:    [id number]     Weight:       166.0 lb Date of Birth:  1940-08-21      BSA:          1.807 m Patient Age:    81 years       BP:           137/89 mmHg Patient Gender: F              HR:           85 bpm. Exam Location:  Inpatient Procedure: 2D Echo, Color Doppler, Cardiac Doppler and 3D Echo Indications:    Atrial Fibrillation I48.91  History:        Patient has no prior history of Echocardiogram examinations.                 Arrythmias:Palpitations.; Risk Factors:Hypertension.  Sonographer:    Darlina Sicilian RDCS Referring Phys: [place]  1. Left ventricular ejection fraction, by estimation, is 45 to 50%. The left ventricle has mildly decreased function. The left ventricle demonstrates global hypokinesis. There is mild concentric left ventricular hypertrophy. Left ventricular diastolic parameters are indeterminate.  2. Right ventricular systolic function is  mildly reduced. The right ventricular size is normal. There is mildly elevated pulmonary artery systolic pressure. The estimated right ventricular systolic pressure is 24.0 mmHg.  3. Left atrial size was mildly dilated.  4. The mitral valve is normal in structure. No evidence of mitral valve regurgitation. No evidence of mitral stenosis.  5. The aortic valve was not well visualized. Aortic valve regurgitation is not visualized. No aortic stenosis is present.  6. The inferior vena cava is normal in size with greater than 50% respiratory variability, suggesting right atrial pressure of 3 mmHg.  7. Technically difficult study with poor acoustic windows. FINDINGS  Left Ventricle: Left ventricular ejection fraction, by estimation, is 45 to 50%. The left ventricle has mildly decreased function. The left ventricle demonstrates global hypokinesis. The left ventricular internal cavity size was normal in size. There is  mild concentric left ventricular hypertrophy. Left ventricular diastolic parameters are indeterminate. Right Ventricle: The right ventricular size is normal. No increase in right ventricular wall thickness. Right ventricular systolic function is mildly reduced. There is mildly elevated pulmonary artery systolic pressure. The tricuspid regurgitant velocity  is 3.05 m/s, and with an assumed right atrial pressure of 3 mmHg, the estimated right ventricular systolic pressure is 97.3 mmHg. Left Atrium: Left atrial size was mildly dilated. Right Atrium: Right atrial size was normal in size. Pericardium: There is no evidence of pericardial effusion. Mitral Valve: The mitral valve is normal in structure. No evidence of mitral valve regurgitation. No evidence of mitral valve stenosis. Tricuspid Valve: The tricuspid valve is normal in structure. Tricuspid valve regurgitation is mild. Aortic Valve: The aortic valve was not well visualized. Aortic valve regurgitation is not visualized. No aortic stenosis is present.  Pulmonic Valve: The pulmonic valve was  normal in structure. Pulmonic valve regurgitation is not visualized. Aorta: The aortic root is normal in size and structure. Venous: The inferior vena cava is normal in size with greater than 50% respiratory variability, suggesting right atrial pressure of 3 mmHg. IAS/Shunts: No atrial level shunt detected by color flow Doppler.  LEFT VENTRICLE PLAX 2D LVIDd:         4.70 cm   Diastology LVIDs:         3.50 cm   LV e' medial:    5.66 cm/s LV PW:         1.00 cm   LV E/e' medial:  18.5 LV IVS:        1.20 cm   LV e' lateral:   8.24 cm/s LVOT diam:     1.60 cm   LV E/e' lateral: 12.7 LV SV:         30 LV SV Index:   16 LVOT Area:     2.01 cm                           3D Volume EF:                          3D EF:        41 %                          LV EDV:       108 ml                          LV ESV:       63 ml                          LV SV:        44 ml RIGHT VENTRICLE RV Basal diam:  3.90 cm RV Mid diam:    2.30 cm RV S prime:     6.73 cm/s TAPSE (M-mode): 0.8 cm LEFT ATRIUM             Index        RIGHT ATRIUM           Index LA diam:        4.00 cm 2.21 cm/m   RA Area:     13.10 cm LA Vol (A2C):   46.4 ml 25.67 ml/m  RA Volume:   27.20 ml  15.05 ml/m LA Vol (A4C):   65.5 ml 36.24 ml/m LA Biplane Vol: 56.8 ml 31.43 ml/m  AORTIC VALVE LVOT Vmax:   74.40 cm/s LVOT Vmean:  54.900 cm/s LVOT VTI:    0.147 m  AORTA Ao Root diam: 2.60 cm MITRAL VALVE                TRICUSPID VALVE MV Area (PHT): 5.19 cm     TR Peak grad:   37.2 mmHg MV Decel Time: 146 msec     TR Vmax:        305.00 cm/s MV E velocity: 104.88 cm/s                             SHUNTS  Systemic VTI:  0.15 m                             Systemic Diam: 1.60 cm Dalton McleanMD Electronically signed by Franki Monte Signature Date/Time: 02/07/2022/5:23:48 PM    Final    DG Chest 2 View  Result Date: 02/06/2022 CLINICAL DATA:  Shortness of breath for 1 month. EXAM: CHEST - 2 VIEW  COMPARISON:  Chest/rib radiographs 10/11/2015 FINDINGS: The cardiac silhouette is borderline enlarged. The lungs are hyperinflated with mild chronic peribronchial thickening. No acute airspace consolidation, edema, pleural effusion, or pneumothorax is identified. No acute osseous abnormality is seen. IMPRESSION: Chronic bronchitic changes. No evidence of acute cardiopulmonary process. Electronically Signed   By: Logan Bores M.D.   On: 02/06/2022 13:43    Cardiac Studies     Patient Profile     81 y.o. female with history of hypertension who presents with new onset atrial fibrillation.  Assessment & Plan    Atrial fibrillation/flutter: New diagnosis.  CHA2DS2-VASc score 4 (hypertension, age x2, female) -Continue Eliquis 5 mg twice daily -Was started on diltiazem drip as well as metoprolol 25 mg twice daily for rate control on admission.  Weaned off diltiazem 10/18.  Rates were reasonably controlled on metoprolol but now appears to have organized into an atrial flutter, rates 120s.  Atrial flutter can be difficult to rate control, and considering her new systolic dysfunction, recommend TEE/DDCV.  Will increase metoprolol to 50 mg twice daily.  Plan TEE/DCCV tomorrow  Acute systolic heart failure: EF 45 to 50% on echo 10/18.  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  Mildly volume overloaded on exam on admission, was given dose of IV Lasix 20 mg, currently appears euvolemic.  Mild bump in creatinine, will hold further diuresis  Hypertension: Hold home benazepril while titrating metoprolol as above  AKI: mild bump in Cr (0.75>1.16), will hold off on further diuresis for now as above   For questions or updates, please contact [place] Please consult [email or web address] for contact info under        Signed, Donato Heinz, MD  02/08/2022, 7:56 AM

## 2022-02-09 ENCOUNTER — Encounter (HOSPITAL_COMMUNITY)
Admission: EM | Disposition: A | Discharge status: Home / Self Care | Payer: Self-pay | Source: Home / Self Care | Attending: Family Medicine

## 2022-02-09 ENCOUNTER — Encounter (HOSPITAL_COMMUNITY): Payer: Self-pay | Admitting: Family Medicine

## 2022-02-09 ENCOUNTER — Inpatient Hospital Stay (HOSPITAL_COMMUNITY): Payer: Medicare HMO | Admitting: Anesthesiology

## 2022-02-09 ENCOUNTER — Inpatient Hospital Stay (HOSPITAL_COMMUNITY): Payer: Medicare HMO

## 2022-02-09 DIAGNOSIS — I4892 Unspecified atrial flutter: Secondary | ICD-10-CM

## 2022-02-09 DIAGNOSIS — I4891 Unspecified atrial fibrillation: Secondary | ICD-10-CM | POA: Diagnosis not present

## 2022-02-09 DIAGNOSIS — I3139 Other pericardial effusion (noninflammatory): Secondary | ICD-10-CM | POA: Diagnosis not present

## 2022-02-09 DIAGNOSIS — I5021 Acute systolic (congestive) heart failure: Secondary | ICD-10-CM | POA: Diagnosis not present

## 2022-02-09 HISTORY — PX: BUBBLE STUDY: SHX6837

## 2022-02-09 HISTORY — PX: CARDIOVERSION: SHX1299

## 2022-02-09 HISTORY — PX: TEE WITHOUT CARDIOVERSION: SHX5443

## 2022-02-09 LAB — COMPREHENSIVE METABOLIC PANEL
ALT: 15 U/L (ref 0–44)
AST: 17 U/L (ref 15–41)
Albumin: 3.4 g/dL — ABNORMAL LOW (ref 3.5–5.0)
Alkaline Phosphatase: 88 U/L (ref 38–126)
Anion gap: 8 (ref 5–15)
BUN: 18 mg/dL (ref 8–23)
CO2: 29 mmol/L (ref 22–32)
Calcium: 9.5 mg/dL (ref 8.9–10.3)
Chloride: 100 mmol/L (ref 98–111)
Creatinine, Ser: 1.06 mg/dL — ABNORMAL HIGH (ref 0.44–1.00)
GFR, Estimated: 52 mL/min — ABNORMAL LOW (ref >60)
Glucose, Bld: 120 mg/dL — ABNORMAL HIGH (ref 70–99)
Potassium: 5.8 mmol/L — ABNORMAL HIGH (ref 3.5–5.1)
Sodium: 137 mmol/L (ref 135–145)
Total Bilirubin: 0.8 mg/dL (ref 0.3–1.2)
Total Protein: 6.8 g/dL (ref 6.5–8.1)

## 2022-02-09 LAB — CBC WITH DIFFERENTIAL/PLATELET
Abs Immature Granulocytes: 0.02 10*3/uL (ref 0.00–0.07)
Basophils Absolute: 0 10*3/uL (ref 0.0–0.1)
Basophils Relative: 0 %
Eosinophils Absolute: 0.2 10*3/uL (ref 0.0–0.5)
Eosinophils Relative: 3 %
HCT: 38.1 % (ref 36.0–46.0)
Hemoglobin: 12.5 g/dL (ref 12.0–15.0)
Immature Granulocytes: 0 %
Lymphocytes Relative: 16 %
Lymphs Abs: 1.4 10*3/uL (ref 0.7–4.0)
MCH: 30.3 pg (ref 26.0–34.0)
MCHC: 32.8 g/dL (ref 30.0–36.0)
MCV: 92.3 fL (ref 80.0–100.0)
Monocytes Absolute: 0.8 10*3/uL (ref 0.1–1.0)
Monocytes Relative: 9 %
Neutro Abs: 6.4 10*3/uL (ref 1.7–7.7)
Neutrophils Relative %: 72 %
Platelets: 255 10*3/uL (ref 150–400)
RBC: 4.13 MIL/uL (ref 3.87–5.11)
RDW: 12.9 % (ref 11.5–15.5)
WBC: 8.8 10*3/uL (ref 4.0–10.5)
nRBC: 0 % (ref 0.0–0.2)

## 2022-02-09 LAB — MAGNESIUM: Magnesium: 1.8 mg/dL (ref 1.7–2.4)

## 2022-02-09 LAB — PHOSPHORUS: Phosphorus: 4 mg/dL (ref 2.5–4.6)

## 2022-02-09 LAB — POTASSIUM: Potassium: 4.4 mmol/L (ref 3.5–5.1)

## 2022-02-09 SURGERY — ECHOCARDIOGRAM, TRANSESOPHAGEAL
Anesthesia: General

## 2022-02-09 MED ORDER — METOPROLOL TARTRATE 25 MG PO TABS
25.0000 mg | ORAL_TABLET | Freq: Once | ORAL | Status: AC
  Administered 2022-02-09: 25 mg via ORAL
  Filled 2022-02-09: qty 1

## 2022-02-09 MED ORDER — PHENYLEPHRINE 80 MCG/ML (10ML) SYRINGE FOR IV PUSH (FOR BLOOD PRESSURE SUPPORT)
PREFILLED_SYRINGE | INTRAVENOUS | Status: DC | PRN
  Administered 2022-02-09: 80 ug via INTRAVENOUS
  Administered 2022-02-09: 160 ug via INTRAVENOUS

## 2022-02-09 MED ORDER — PROPOFOL 500 MG/50ML IV EMUL
INTRAVENOUS | Status: DC | PRN
  Administered 2022-02-09: 100 ug/kg/min via INTRAVENOUS
  Administered 2022-02-09: 150 ug/kg/min via INTRAVENOUS

## 2022-02-09 MED ORDER — ORAL CARE MOUTH RINSE: 15.0000 mL | OROMUCOSAL | Status: DC | PRN

## 2022-02-09 MED ORDER — METOPROLOL SUCCINATE ER 50 MG PO TB24
50.0000 mg | ORAL_TABLET | Freq: Every day | ORAL | Status: DC
  Administered 2022-02-10: 50 mg via ORAL
  Filled 2022-02-09: qty 1

## 2022-02-09 MED ORDER — PROPOFOL 10 MG/ML IV BOLUS
INTRAVENOUS | Status: DC | PRN
  Administered 2022-02-09 (×2): 20 mg via INTRAVENOUS

## 2022-02-09 MED ORDER — OFF THE BEAT BOOK
Freq: Once | Status: AC
  Filled 2022-02-09 (×2): qty 1

## 2022-02-09 MED ORDER — SODIUM CHLORIDE 0.9 % IV SOLN
INTRAVENOUS | Status: AC | PRN
  Administered 2022-02-09: 20 mL via INTRAVENOUS

## 2022-02-09 NOTE — Evaluation (Signed)
Physical Therapy Evaluation Patient Details Name: Bianca Wolf MRN: [id number] DOB: 02-Jan-1941 Today's Date: 02/09/2022  History of Present Illness  Pt is 81 y/o F presented to [facility] on 02/06/22 with palpitations, DOE, new onset of Afib., acute systolic heart failure. Significant PMH of osteoporosis, HTN, GERD, falls in last 6 months resulting in back pain, L3 kyphoplasty.  Clinical Impression  Pt doing well with mobility and no further PT needed.  Ready for dc from PT standpoint.        Recommendations for follow up therapy are one component of a multi-disciplinary discharge planning process, led by the attending physician.  Recommendations may be updated based on patient status, additional functional criteria and insurance authorization.  Follow Up Recommendations No PT follow up      Assistance Recommended at Discharge None  Patient can return home with the following       Equipment Recommendations None recommended by PT  Recommendations for Other Services       Functional Status Assessment Patient has not had a recent decline in their functional status     Precautions / Restrictions Precautions Precautions: Other (comment) Precaution Comments: watch HR      Mobility  Bed Mobility Overal bed mobility: Independent                  Transfers Overall transfer level: Independent Equipment used: None                    Ambulation/Gait Ambulation/Gait assistance: Modified independent (Device/Increase time) Gait Distance (Feet): 350 Feet Assistive device: None Gait Pattern/deviations: Step-through pattern Gait velocity: normal Gait velocity interpretation: >4.37 ft/sec, indicative of normal walking speed   General Gait Details: Steady gait  Stairs            Wheelchair Mobility    Modified Rankin (Stroke Patients Only)       Balance Overall balance assessment: No apparent balance deficits (not formally assessed)                                            Pertinent Vitals/Pain Pain Assessment Pain Assessment: No/denies pain    Home Living Family/patient expects to be discharged to:: Private residence Living Arrangements: Alone   Type of Home: Apartment Home Access: Level entry       Home Layout: One level Home Equipment: None      Prior Function Prior Level of Function : Independent/Modified Independent;Working/employed;Driving;History of Falls (last six months)             Mobility Comments: Works at [employer]. No assistive device       Hand Dominance        Extremity/Trunk Assessment   Upper Extremity Assessment Upper Extremity Assessment: Overall WFL for tasks assessed    Lower Extremity Assessment Lower Extremity Assessment: Overall WFL for tasks assessed       Communication   Communication: No difficulties  Cognition Arousal/Alertness: Awake/alert Behavior During Therapy: WFL for tasks assessed/performed Overall Cognitive Status: Within Functional Limits for tasks assessed                                          General Comments General comments (skin integrity, edema, etc.): RHR 100. HR with amb 135    Exercises  Assessment/Plan    PT Assessment Patient does not need any further PT services  PT Problem List         PT Treatment Interventions      PT Goals (Current goals can be found in the Care Plan section)  Acute Rehab PT Goals Patient Stated Goal: go home and back to work PT Goal Formulation: All assessment and education complete, DC therapy    Frequency       Co-evaluation               AM-PAC PT "6 Clicks" Mobility  Outcome Measure Help needed turning from your back to your side while in a flat bed without using bedrails?: None Help needed moving from lying on your back to sitting on the side of a flat bed without using bedrails?: None Help needed moving to and from a bed to a chair (including a wheelchair)?:  None Help needed standing up from a chair using your arms (e.g., wheelchair or bedside chair)?: None Help needed to walk in hospital room?: None Help needed climbing 3-5 steps with a railing? : None 6 Click Score: 24    End of Session   Activity Tolerance: Patient tolerated treatment well Patient left: in chair;with call bell/phone within reach Nurse Communication: Mobility status PT Visit Diagnosis: History of falling (Z91.81)    Time: [id number] PT Time Calculation (min) (ACUTE ONLY): 16 min   Charges:   PT Evaluation $PT Eval Low Complexity: 1 Low          [place] [facility] [place] 02/09/2022, 9:23 AM

## 2022-02-09 NOTE — Anesthesia Postprocedure Evaluation (Signed)
Anesthesia Post Note  Patient: Bianca Wolf  Procedure(s) Performed: TRANSESOPHAGEAL ECHOCARDIOGRAM (TEE) CARDIOVERSION BUBBLE STUDY     Patient location during evaluation: PACU Anesthesia Type: General Level of consciousness: awake and alert and oriented Pain management: pain level controlled Vital Signs Assessment: post-procedure vital signs reviewed and stable Respiratory status: spontaneous breathing, nonlabored ventilation and respiratory function stable Cardiovascular status: blood pressure returned to baseline and stable Postop Assessment: no apparent nausea or vomiting Anesthetic complications: no   No notable events documented.  Last Vitals:  Vitals:   02/09/22 1331 02/09/22 1340  BP: (!) 99/49 (!) 97/57  Pulse: (!) 51 (!) 53  Resp: (!) 23 (!) 25  Temp:    SpO2: 95% 98%    Last Pain:  Vitals:   02/09/22 1140  TempSrc: Temporal  PainSc: 0-No pain                 Rhea Thrun A.

## 2022-02-09 NOTE — Anesthesia Preprocedure Evaluation (Signed)
Anesthesia Evaluation  Patient identified by MRN, date of birth, ID band Patient awake    Reviewed: Allergy & Precautions, NPO status , Patient's Chart, lab work & pertinent test results, reviewed documented beta blocker date and time   Airway Mallampati: II  TM Distance: >3 FB Neck ROM: Full    Dental  (+) Lower Dentures, Upper Dentures   Pulmonary asthma , former smoker   Pulmonary exam normal breath sounds clear to auscultation       Cardiovascular hypertension, Pt. on medications and Pt. on home beta blockers +CHF  + dysrhythmias Atrial Fibrillation  Rhythm:Irregular Rate:Normal     Neuro/Psych negative neurological ROS  negative psych ROS   GI/Hepatic Neg liver ROS,GERD  Medicated and Controlled,,  Endo/Other   Hyperthyroidism   Renal/GU Renal disease  negative genitourinary   Musculoskeletal negative musculoskeletal ROS (+)    Abdominal   Peds  Hematology Eliquis therapy- last dose this am   Anesthesia Other Findings   Reproductive/Obstetrics                             Anesthesia Physical Anesthesia Plan  ASA: 2  Anesthesia Plan: General   Post-op Pain Management: Minimal or no pain anticipated   Induction: Intravenous  PONV Risk Score and Plan: 2 and Treatment may vary due to age or medical condition, Propofol infusion and Ondansetron  Airway Management Planned: Natural Airway and Nasal Cannula  Additional Equipment: None  Intra-op Plan:   Post-operative Plan:   Informed Consent: I have reviewed the patients History and Physical, chart, labs and discussed the procedure including the risks, benefits and alternatives for the proposed anesthesia with the patient or authorized representative who has indicated his/her understanding and acceptance.       Plan Discussed with: Anesthesiologist and CRNA  Anesthesia Plan Comments:        Anesthesia Quick  Evaluation

## 2022-02-09 NOTE — CV Procedure (Signed)
INDICATIONS: Atrial flutter  PROCEDURE:   Informed consent was obtained prior to the procedure. The risks, benefits and alternatives for the procedure were discussed and the patient comprehended these risks.  Risks include, but are not limited to, cough, sore throat, vomiting, nausea, somnolence, esophageal and stomach trauma or perforation, bleeding, low blood pressure, aspiration, pneumonia, infection, trauma to the teeth and death.    After a procedural time-out, the oropharynx was anesthetized with 20% benzocaine spray.   During this procedure the patient was administered propofol per anesthesia.  The patient's heart rate, blood pressure, and oxygen saturation were monitored continuously during the procedure. The period of conscious sedation was 20 minutes, of which I was present face-to-face 100% of this time.  The transesophageal probe was inserted in the esophagus and stomach without difficulty and multiple views were obtained.  The patient was kept under observation until the patient left the procedure room.  The patient left the procedure room in stable condition.   Agitated microbubble saline contrast was administered.  COMPLICATIONS:    There were no immediate complications.  FINDINGS:   FORMAL ECHOCARDIOGRAM REPORT PENDING No LA/LAA thrombus  Procedure: Electrical Cardioversion Indications:  Atrial Fibrillation and Atrial Flutter  Procedure Details:  Consent: Risks of procedure as well as the alternatives and risks of each were explained to the (patient/caregiver).  Consent for procedure obtained.  Time Out: Verified patient identification, verified procedure, site/side was marked, verified correct patient position, special equipment/implants available, medications/allergies/relevent history reviewed, required imaging and test results available. PERFORMED.  Patient placed on cardiac monitor, pulse oximetry, supplemental oxygen as necessary.  Sedation given:   propofol Pacer pads placed anterior and posterior chest.  Cardioverted 1 time(s).  Cardioversion with synchronized biphasic 120J shock.  Evaluation: Findings: Post procedure EKG shows: sinus brady Complications: None Patient did tolerate procedure well.  Time Spent Directly with the Patient:  45 minutes   Elouise Munroe 02/09/2022, 1:23 PM

## 2022-02-09 NOTE — Care Management Important Message (Signed)
Important Message  Patient Details  Name: AVIS TIRONE MRN: [id number] Date of Birth: 1940/10/01   Medicare Important Message Given:  Yes     Shelda Altes 02/09/2022, 9:24 AM

## 2022-02-09 NOTE — Progress Notes (Addendum)
PROGRESS NOTE    Bianca Wolf  [id number] DOB: October 29, 1940 DOA: 02/06/2022 PCP: [name], MD  Chief Complaint  Patient presents with   Atrial Fibrillation    Brief Narrative:  Bianca Wolf is Bianca Wolf 81 y.o. female with medical history significant of HTN presenting with palpitations. She reports that she was supposed to go check on her Prolia today - has to do an IV because it is cheaper ($50 vs. 287).  She also planned to see Dr. [name] in the morning for SOB for the last month.  It comes and goes, worse with exertion.  She has been out of work for back trouble for about 3 months, but she finally went back to work at [employer] and fell over something 2 days later and bruised her chest 3-4 weeks ago.  She is still working despite the fall - 4 hours Minal Stuller day 4 days Ethie Curless week.  She does not feel palpitations or her heart racing.     Assessment & Plan:   Principal Problem:   Atrial fibrillation with RVR (HCC) Active Problems:   Age-related osteoporosis without current pathological fracture   Essential hypertension   Atrial fibrillation (HCC)   Assessment and Plan: New onset Afib with RVR  -Patient presenting with new-onset afib.  -TSH wnl.  Related to longstanding HTN? 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 Left ventricular ejection fraction, by estimation, is 45 to 50%. The  left ventricle has mildly decreased function. The left ventricle  demonstrates global hypokinesis. There is mild concentric left ventricular  hypertrophy. Left ventricular diastolic  parameters are indeterminate.   2. Right ventricular systolic function is mildly reduced. The right  ventricular size is normal. There is mildly elevated pulmonary artery  systolic pressure. The estimated right ventricular systolic pressure is  Q000111Q mmHg.   3. Left atrial size was mildly dilated.   4. The mitral valve is normal in structure. No evidence of mitral valve  regurgitation. No evidence of mitral stenosis.   5. The aortic valve was not well visualized. Aortic valve regurgitation  is not visualized. No aortic stenosis is present.   6. The inferior vena cava is normal in size with greater than 50%  respiratory variability, suggesting right atrial pressure of 3 mmHg.   7. Technically difficult study with poor acoustic windows.   TEE IMPRESSIONS     1. Left ventricular ejection fraction, by estimation, is 50%. The left  ventricle has low normal function.   2. Right ventricular systolic function is mildly reduced. The right  ventricular size is mildly enlarged.  3. Left atrial size was moderately dilated. No left atrial/left atrial  appendage thrombus was detected. The LAA emptying velocity was 30 cm/s.   4. Right atrial size was moderately dilated.   5. Demetrice Combes small pericardial effusion is present.   6. The mitral valve is grossly normal. Trivial mitral valve  regurgitation.   7. Tricuspid valve regurgitation is moderate.   8. The aortic  valve is grossly normal. Aortic valve regurgitation is not  visualized.   9. 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  10. Agitated saline contrast bubble study was negative, with no evidence  of any interatrial shunt.   Antimicrobials:  Anti-infectives (From admission, onward)    None       Subjective: No new complaints  Objective: Vitals:   02/09/22 1331 02/09/22 1340 02/09/22 1403 02/09/22 1549  BP: (!) 99/49 (!) 97/57  (!) 116/59  Pulse: (!) 51 (!) 53 (!) 52 97  Resp: (!) 23 (!) 25  16  Temp:   (!) 97.3 F (36.3 C)   TempSrc:   Oral   SpO2: 95% 98%  96%  Weight:      Height:        Intake/Output Summary (Last 24 hours) at 02/09/2022 1853 Last data filed at 02/09/2022 1313 Gross per 24 hour  Intake 353.36 ml  Output --  Net 353.36 ml   Filed Weights   02/06/22 1258  Weight: 75.3 kg    Examination:  General: No acute distress. Cardiovascular: sinus rhythm  Lungs: unlabored Neurological: Alert and oriented 3. Moves all extremities 4 with equal strength. Cranial nerves II through XII grossly intact. Extremities: No clubbing or cyanosis. No edema.   Data Reviewed: I have personally reviewed following labs and imaging studies  CBC: Recent Labs  Lab 02/06/22 1327 02/07/22 0129 02/08/22 0335 02/09/22 0155  WBC 8.0 8.3 7.5 8.8  NEUTROABS  --   --  5.1 6.4  HGB 11.7* 11.2* 12.1 12.5  HCT 38.0 35.1* 37.9 38.1  MCV 95.5 94.6 93.8 92.3  PLT 270 241 259 [id number]    Basic Metabolic Panel: Recent Labs  Lab 02/06/22 1327 02/06/22 1531 02/07/22 0129 02/08/22 0335 02/08/22 1501 02/09/22 0155 02/09/22 0836  NA 140  --  139 137 137 137  --   K 3.5  --  3.8 5.3* 4.6 5.8* 4.4  CL 103  --  103 102 101 100  --   CO2 26  --  29 28 26 29   --   GLUCOSE 89  --  122* 109* 140* 120*  --   BUN 9  --  8 14 16 18   --   CREATININE 0.76  --  0.75 1.16* 1.17* 1.06*  --   CALCIUM 9.4  --  9.0 9.5 9.2 9.5  --   MG  --   1.7  --  1.8  --  1.8  --   PHOS  --   --   --  3.5  --  4.0  --     GFR: Estimated Creatinine Clearance: 40.6 mL/min (Cleven Jansma) (by C-G formula based on SCr of 1.06 mg/dL (H)).  Liver Function Tests: Recent Labs  Lab 02/08/22 0335 02/09/22 0155  AST 16 17  ALT 13 15  ALKPHOS 91 88  BILITOT 0.5 0.8  PROT 6.7 6.8  ALBUMIN 3.2* 3.4*    CBG: No results for input(s): "GLUCAP" in the last 168 hours.   No results found  for this or any previous visit (from the past 240 hour(s)).       Radiology Studies: ECHO TEE  Result Date: 02/09/2022    TRANSESOPHOGEAL ECHO REPORT   Patient Name:   JULEAH PARADISE Date of Exam: 02/09/2022 Medical Rec #:  [id number]      Height:       64.0 in Accession #:    [id number]     Weight:       166.0 lb Date of Birth:  1940/06/29      BSA:          1.807 m Patient Age:    72 years       BP:           138/78 mmHg Patient Gender: F              HR:           89 bpm. Exam Location:  Inpatient Procedure: Cardiac Doppler, Color Doppler, Transesophageal Echo and Saline            Contrast Bubble Study Indications:     I48.91* Unspeicified atrial fibrillation  History:         Patient has prior history of Echocardiogram examinations, most                  recent 02/07/2022. Arrythmias:Atrial Fibrillation; Risk                  Factors:Hypertension and GERD.  Sonographer:     Bernadene Person RDCS Referring Phys:  [id number] Margie Billet Diagnosing Phys: Cherlynn Kaiser MD PROCEDURE: After discussion of the risks and benefits of Timber Marshman TEE, an informed consent was obtained from the patient. TEE procedure time was 17 minutes. The transesophogeal probe was passed without difficulty through the esophogus of the patient. Imaged were obtained with the patient in Marlan Steward left lateral decubitus position. Local oropharyngeal anesthetic was provided with Cetacaine. Sedation performed by different physician. The patient was monitored while under deep sedation. Anesthestetic sedation was provided  intravenously by Anesthesiology: 578.4mg  of Propofol. Image quality was good. The patient's vital signs; including heart rate, blood pressure, and oxygen saturation; remained stable throughout the procedure. The patient developed no complications during the procedure. Coriana Angello successful direct current cardioversion was performed at 120 joules with 1 attempt. IMPRESSIONS  1. Left ventricular ejection fraction, by estimation, is 50%. The left ventricle has low normal function.  2. Right ventricular systolic function is mildly reduced. The right ventricular size is mildly enlarged.  3. Left atrial size was moderately dilated. No left atrial/left atrial appendage thrombus was detected. The LAA emptying velocity was 30 cm/s.  4. Right atrial size was moderately dilated.  5. Erhardt Dada small pericardial effusion is present.  6. The mitral valve is grossly normal. Trivial mitral valve regurgitation.  7. Tricuspid valve regurgitation is moderate.  8. The aortic valve is grossly normal. Aortic valve regurgitation is not visualized.  9. 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 10. Agitated saline contrast bubble study was negative, with no evidence of any interatrial shunt. FINDINGS  Left Ventricle: Left ventricular ejection fraction, by estimation, is 50%. The left ventricle has low normal function. The left ventricular internal cavity size was normal in size. Right Ventricle: Prominent moderator band. The right ventricular size is mildly enlarged. No increase in right ventricular wall thickness. Right ventricular systolic function is mildly reduced. Left Atrium: Left atrial size was moderately dilated. No left  atrial/left atrial appendage thrombus was detected. The LAA emptying velocity was 30 cm/s. Right Atrium: Right atrial size was moderately dilated. Pericardium: Glendola Friedhoff small pericardial effusion is present. Mitral Valve: The mitral valve is grossly normal. Trivial mitral valve  regurgitation. Tricuspid Valve: The tricuspid valve is grossly normal. Tricuspid valve regurgitation is moderate. Aortic Valve: The aortic valve is grossly normal. Aortic valve regurgitation is not visualized. Pulmonic Valve: The pulmonic valve was normal in structure. Pulmonic valve regurgitation is trivial. Aorta: The aortic root and ascending aorta are structurally normal, with no evidence of dilitation. There is severe (Grade IV) atheroma plaque involving the aortic arch and descending aorta. IAS/Shunts: No atrial level shunt detected by color flow Doppler. Agitated saline contrast was given intravenously to evaluate for intracardiac shunting. Agitated saline contrast bubble study was negative, with no evidence of any interatrial shunt. Additional Comments: Spectral Doppler performed.  LEFT VENTRICLE PLAX 2D LVOT diam:     1.80 cm LVOT Area:     2.54 cm   AORTA Ao Root diam: 2.80 cm Ao Asc diam:  3.00 cm TRICUSPID VALVE TR Peak grad:   27.9 mmHg TR Vmax:        264.00 cm/s  SHUNTS Systemic Diam: 1.80 cm Cherlynn Kaiser MD Electronically signed by Cherlynn Kaiser MD Signature Date/Time: 02/09/2022/2:34:45 PM    Final         Scheduled Meds:  apixaban  5 mg Oral BID   metoprolol tartrate  50 mg Oral BID   pantoprazole  40 mg Oral Daily   sodium chloride flush  3 mL Intravenous Q12H   Continuous Infusions:     LOS: 3 days    Time spent: over 30 min     Fayrene Helper, MD [facility]   To contact the attending provider between 7A-7P or the covering provider during after hours 7P-7A, please log into the web site [email or web address] and access using universal [place] password for that web site. If you do not have the password, please call the hospital operator.  02/09/2022, 6:53 PM

## 2022-02-09 NOTE — Progress Notes (Addendum)
Rounding Note    Patient Name: Bianca Wolf Date of Encounter: 02/09/2022  [place] Cardiologist: None   Subjective   Denies any CP or SOB. No cardiac awareness of afib.   Inpatient Medications    Scheduled Meds:  apixaban  5 mg Oral BID   metoprolol tartrate  50 mg Oral BID   pantoprazole  40 mg Oral Daily   sodium chloride flush  3 mL Intravenous Q12H   Continuous Infusions:  sodium chloride 20 mL/hr at 02/09/22 0626   PRN Meds: acetaminophen **OR** acetaminophen, hydrALAZINE, ondansetron **OR** ondansetron (ZOFRAN) IV, mouth rinse   Vital Signs    Vitals:   02/08/22 2300 02/08/22 2350 02/09/22 0000 02/09/22 0546  BP:  (!) 140/103  138/78  Pulse: 93 95  (!) 103  Resp: (!) 25 (!) 28 20 20   Temp:  [id number] F (36.6 C)  97.8 F (36.6 C)  TempSrc:  Oral  Oral  SpO2: 95% 94%  95%  Weight:      Height:        Intake/Output Summary (Last 24 hours) at 02/09/2022 0806 Last data filed at 02/09/2022 0219 Gross per 24 hour  Intake 261.09 ml  Output --  Net 261.09 ml      02/06/2022   12:58 PM 12/21/2021    2:31 PM 11/27/2021    1:16 PM  Last 3 Weights  Weight (lbs) 166 lb 161 lb 12.8 oz 160 lb 12.8 oz  Weight (kg) 75.297 kg 73.392 kg 72.938 kg      Telemetry    Atrial flutter with HR high 90s to 120s- Personally Reviewed  ECG    EKG 10/18, atrial flutter, HR 30s, bradycardic - Personally Reviewed  Physical Exam   GEN: No acute distress.   Neck: No JVD Cardiac: irregular, no murmurs, rubs, or gallops.  Respiratory: Clear to auscultation bilaterally. GI: Soft, nontender, non-distended  MS: No edema; No deformity. Neuro:  Nonfocal  Psych: Normal affect   Labs    High Sensitivity Troponin:  No results for input(s): "TROPONINIHS" in the last 720 hours.   Chemistry Recent Labs  Lab 02/06/22 1531 02/07/22 0129 02/08/22 0335 02/08/22 1501 02/09/22 0155  NA  --    < > 137 137 137  K  --    < > 5.3* 4.6 5.8*  CL  --    < > 102 101  100  CO2  --    < > 28 26 29   GLUCOSE  --    < > 109* 140* 120*  BUN  --    < > 14 16 18   CREATININE  --    < > 1.16* 1.17* 1.06*  CALCIUM  --    < > 9.5 9.2 9.5  MG 1.7  --  1.8  --  1.8  PROT  --   --  6.7  --  6.8  ALBUMIN  --   --  3.2*  --  3.4*  AST  --   --  16  --  17  ALT  --   --  13  --  15  ALKPHOS  --   --  91  --  88  BILITOT  --   --  0.5  --  0.8  GFRNONAA  --    < > 47* 47* 52*  ANIONGAP  --    < > 7 10 8    < > = values in this interval not displayed.  Lipids No results for input(s): "CHOL", "TRIG", "HDL", "LABVLDL", "LDLCALC", "CHOLHDL" in the last 168 hours.  Hematology Recent Labs  Lab 02/07/22 0129 02/08/22 0335 02/09/22 0155  WBC 8.3 7.5 8.8  RBC 3.71* 4.04 4.13  HGB 11.2* 12.1 12.5  HCT 35.1* 37.9 38.1  MCV 94.6 93.8 92.3  MCH 30.2 30.0 30.3  MCHC 31.9 31.9 32.8  RDW 12.9 12.9 12.9  PLT 241 259 255   Thyroid  Recent Labs  Lab 02/06/22 1327  TSH 1.223    BNPNo results for input(s): "BNP", "PROBNP" in the last 168 hours.  DDimer No results for input(s): "DDIMER" in the last 168 hours.   Radiology    ECHOCARDIOGRAM COMPLETE  Result Date: 02/07/2022    ECHOCARDIOGRAM REPORT   Patient Name:   Bianca Wolf Date of Exam: 02/07/2022 Medical Rec #:  [id number]      Height:       64.0 in Accession #:    [id number]     Weight:       166.0 lb Date of Birth:  29-May-1940      BSA:          1.807 m Patient Age:    81 years       BP:           137/89 mmHg Patient Gender: F              HR:           85 bpm. Exam Location:  Inpatient Procedure: 2D Echo, Color Doppler, Cardiac Doppler and 3D Echo Indications:    Atrial Fibrillation I48.91  History:        Patient has no prior history of Echocardiogram examinations.                 Arrythmias:Palpitations.; Risk Factors:Hypertension.  Sonographer:    Darlina Sicilian RDCS Referring Phys: [place]  1. Left ventricular ejection fraction, by estimation, is 45 to 50%. The left ventricle has  mildly decreased function. The left ventricle demonstrates global hypokinesis. There is mild concentric left ventricular hypertrophy. Left ventricular diastolic parameters are indeterminate.  2. Right ventricular systolic function is mildly reduced. The right ventricular size is normal. There is mildly elevated pulmonary artery systolic pressure. The estimated right ventricular systolic pressure is Q000111Q mmHg.  3. Left atrial size was mildly dilated.  4. The mitral valve is normal in structure. No evidence of mitral valve regurgitation. No evidence of mitral stenosis.  5. The aortic valve was not well visualized. Aortic valve regurgitation is not visualized. No aortic stenosis is present.  6. The inferior vena cava is normal in size with greater than 50% respiratory variability, suggesting right atrial pressure of 3 mmHg.  7. Technically difficult study with poor acoustic windows. FINDINGS  Left Ventricle: Left ventricular ejection fraction, by estimation, is 45 to 50%. The left ventricle has mildly decreased function. The left ventricle demonstrates global hypokinesis. The left ventricular internal cavity size was normal in size. There is  mild concentric left ventricular hypertrophy. Left ventricular diastolic parameters are indeterminate. Right Ventricle: The right ventricular size is normal. No increase in right ventricular wall thickness. Right ventricular systolic function is mildly reduced. There is mildly elevated pulmonary artery systolic pressure. The tricuspid regurgitant velocity  is 3.05 m/s, and with an assumed right atrial pressure of 3 mmHg, the estimated right ventricular systolic pressure is Q000111Q mmHg. Left Atrium: Left atrial size was mildly dilated. Right Atrium: Right atrial size  was normal in size. Pericardium: There is no evidence of pericardial effusion. Mitral Valve: The mitral valve is normal in structure. No evidence of mitral valve regurgitation. No evidence of mitral valve stenosis.  Tricuspid Valve: The tricuspid valve is normal in structure. Tricuspid valve regurgitation is mild. Aortic Valve: The aortic valve was not well visualized. Aortic valve regurgitation is not visualized. No aortic stenosis is present. Pulmonic Valve: The pulmonic valve was normal in structure. Pulmonic valve regurgitation is not visualized. Aorta: The aortic root is normal in size and structure. Venous: The inferior vena cava is normal in size with greater than 50% respiratory variability, suggesting right atrial pressure of 3 mmHg. IAS/Shunts: No atrial level shunt detected by color flow Doppler.  LEFT VENTRICLE PLAX 2D LVIDd:         4.70 cm   Diastology LVIDs:         3.50 cm   LV e' medial:    5.66 cm/s LV PW:         1.00 cm   LV E/e' medial:  18.5 LV IVS:        1.20 cm   LV e' lateral:   8.24 cm/s LVOT diam:     1.60 cm   LV E/e' lateral: 12.7 LV SV:         30 LV SV Index:   16 LVOT Area:     2.01 cm                           3D Volume EF:                          3D EF:        41 %                          LV EDV:       108 ml                          LV ESV:       63 ml                          LV SV:        44 ml RIGHT VENTRICLE RV Basal diam:  3.90 cm RV Mid diam:    2.30 cm RV S prime:     6.73 cm/s TAPSE (M-mode): 0.8 cm LEFT ATRIUM             Index        RIGHT ATRIUM           Index LA diam:        4.00 cm 2.21 cm/m   RA Area:     13.10 cm LA Vol (A2C):   46.4 ml 25.67 ml/m  RA Volume:   27.20 ml  15.05 ml/m LA Vol (A4C):   65.5 ml 36.24 ml/m LA Biplane Vol: 56.8 ml 31.43 ml/m  AORTIC VALVE LVOT Vmax:   74.40 cm/s LVOT Vmean:  54.900 cm/s LVOT VTI:    0.147 m  AORTA Ao Root diam: 2.60 cm MITRAL VALVE                TRICUSPID VALVE MV Area (PHT): 5.19 cm     TR Peak grad:   37.2 mmHg MV  Decel Time: 146 msec     TR Vmax:        305.00 cm/s MV E velocity: 104.88 cm/s                             SHUNTS                             Systemic VTI:  0.15 m                             Systemic Diam:  1.60 cm Dalton McleanMD Electronically signed by Franki Monte Signature Date/Time: 02/07/2022/5:23:48 PM    Final     Cardiac Studies   Echo 02/07/2022  1. Left ventricular ejection fraction, by estimation, is 45 to 50%. The  left ventricle has mildly decreased function. The left ventricle  demonstrates global hypokinesis. There is mild concentric left ventricular  hypertrophy. Left ventricular diastolic  parameters are indeterminate.   2. Right ventricular systolic function is mildly reduced. The right  ventricular size is normal. There is mildly elevated pulmonary artery  systolic pressure. The estimated right ventricular systolic pressure is  16.0 mmHg.   3. Left atrial size was mildly dilated.   4. The mitral valve is normal in structure. No evidence of mitral valve  regurgitation. No evidence of mitral stenosis.   5. The aortic valve was not well visualized. Aortic valve regurgitation  is not visualized. No aortic stenosis is present.   6. The inferior vena cava is normal in size with greater than 50%  respiratory variability, suggesting right atrial pressure of 3 mmHg.   7. Technically difficult study with poor acoustic windows.   Patient Profile     81 y.o. female with PMH of HTN presented with new onset of atrial fibrillation  Assessment & Plan    Newly diagnosed atrial fibrillation/atrial flutter  - started on Eliquis during this admission  - IV diltiazem weaned off 10/18. On metoprolol, dose increased to 50mg  BID on 10/19  - plan for TEE DCCV today. Discussed risk and benefit of the procedure include 1:10000 risk of bradycardia, hypotension, aspiration risk, respiratory failure, esophageal injury (bleeding, hematoma and rupture)  Acute CHF: EF 45-50% on echo in 02/07/2022, suspect tachy-mediated. IV diuretic held after Cr bump and patient achieved euvolemic level.  HTN: home benazepril held  Hyperkalemia: K 5.8, up from 4.6 yesterday, may not be accurate, recheck  showed 4.4       For questions or updates, please contact [place] Please consult [email or web address] for contact info under        Signed, Almyra Deforest, [place]ias  02/09/2022, 8:06 AM    Patient seen and examined.  Agree with above documentation.  On exam, patient is alert and oriented, tachycardic, irregular rhythm, no murmurs, lungs CTAB, no LE edema or JVD.  Remains in Afib with elevated rates.  Plan TEE/DCCV today.  Donato Heinz, MD

## 2022-02-09 NOTE — Transfer of Care (Signed)
Immediate Anesthesia Transfer of Care Note  Patient: EMMANUELA GHAZI  Procedure(s) Performed: TRANSESOPHAGEAL ECHOCARDIOGRAM (TEE) CARDIOVERSION BUBBLE STUDY  Patient Location: Endoscopy Unit  Anesthesia Type:MAC  Level of Consciousness: drowsy  Airway & Oxygen Therapy: Patient Spontanous Breathing and Patient connected to face mask oxygen  Post-op Assessment: Report given to RN and Post -op Vital signs reviewed and stable  Post vital signs: Reviewed and stable  Last Vitals:  Vitals Value Taken Time  BP    Temp    Pulse 51 02/09/22 1329  Resp 25 02/09/22 1329  SpO2 95 % 02/09/22 1329  Vitals shown include unvalidated device data.  Last Pain:  Vitals:   02/09/22 1140  TempSrc: Temporal  PainSc: 0-No pain         Complications: No notable events documented.

## 2022-02-09 NOTE — Interval H&P Note (Signed)
History and Physical Interval Note:  02/09/2022 11:45 AM  Bianca Wolf  has presented today for surgery, with the diagnosis of afib.  The various methods of treatment have been discussed with the patient and family. After consideration of risks, benefits and other options for treatment, the patient has consented to  Procedure(s): TRANSESOPHAGEAL ECHOCARDIOGRAM (TEE) (N/A) CARDIOVERSION (N/A) as a surgical intervention.  The patient's history has been reviewed, patient examined, no change in status, stable for surgery.  I have reviewed the patient's chart and labs.  Questions were answered to the patient's satisfaction.     Elouise Munroe

## 2022-02-10 DIAGNOSIS — I4891 Unspecified atrial fibrillation: Secondary | ICD-10-CM | POA: Diagnosis not present

## 2022-02-10 DIAGNOSIS — I5021 Acute systolic (congestive) heart failure: Secondary | ICD-10-CM | POA: Diagnosis not present

## 2022-02-10 DIAGNOSIS — I10 Essential (primary) hypertension: Secondary | ICD-10-CM | POA: Diagnosis not present

## 2022-02-10 LAB — COMPREHENSIVE METABOLIC PANEL
ALT: 25 U/L (ref 0–44)
AST: 28 U/L (ref 15–41)
Albumin: 3.4 g/dL — ABNORMAL LOW (ref 3.5–5.0)
Alkaline Phosphatase: 88 U/L (ref 38–126)
Anion gap: 6 (ref 5–15)
BUN: 24 mg/dL — ABNORMAL HIGH (ref 8–23)
CO2: 27 mmol/L (ref 22–32)
Calcium: 9.3 mg/dL (ref 8.9–10.3)
Chloride: 103 mmol/L (ref 98–111)
Creatinine, Ser: 1.26 mg/dL — ABNORMAL HIGH (ref 0.44–1.00)
GFR, Estimated: 43 mL/min — ABNORMAL LOW (ref >60)
Glucose, Bld: 109 mg/dL — ABNORMAL HIGH (ref 70–99)
Potassium: 4.7 mmol/L (ref 3.5–5.1)
Sodium: 136 mmol/L (ref 135–145)
Total Bilirubin: 0.7 mg/dL (ref 0.3–1.2)
Total Protein: 6.8 g/dL (ref 6.5–8.1)

## 2022-02-10 LAB — CBC WITH DIFFERENTIAL/PLATELET
Abs Immature Granulocytes: 0.04 10*3/uL (ref 0.00–0.07)
Basophils Absolute: 0 10*3/uL (ref 0.0–0.1)
Basophils Relative: 0 %
Eosinophils Absolute: 0.1 10*3/uL (ref 0.0–0.5)
Eosinophils Relative: 2 %
HCT: 35.3 % — ABNORMAL LOW (ref 36.0–46.0)
Hemoglobin: 11.6 g/dL — ABNORMAL LOW (ref 12.0–15.0)
Immature Granulocytes: 1 %
Lymphocytes Relative: 17 %
Lymphs Abs: 1.4 10*3/uL (ref 0.7–4.0)
MCH: 30.4 pg (ref 26.0–34.0)
MCHC: 32.9 g/dL (ref 30.0–36.0)
MCV: 92.7 fL (ref 80.0–100.0)
Monocytes Absolute: 0.7 10*3/uL (ref 0.1–1.0)
Monocytes Relative: 9 %
Neutro Abs: 6 10*3/uL (ref 1.7–7.7)
Neutrophils Relative %: 71 %
Platelets: 235 10*3/uL (ref 150–400)
RBC: 3.81 MIL/uL — ABNORMAL LOW (ref 3.87–5.11)
RDW: 12.8 % (ref 11.5–15.5)
WBC: 8.3 10*3/uL (ref 4.0–10.5)
nRBC: 0 % (ref 0.0–0.2)

## 2022-02-10 LAB — PHOSPHORUS: Phosphorus: 4 mg/dL (ref 2.5–4.6)

## 2022-02-10 LAB — MAGNESIUM: Magnesium: 1.8 mg/dL (ref 1.7–2.4)

## 2022-02-10 MED ORDER — METOPROLOL SUCCINATE ER 50 MG PO TB24: 50.0000 mg | ORAL_TABLET | Freq: Every day | ORAL | 1 refills | Status: DC

## 2022-02-10 MED ORDER — APIXABAN 5 MG PO TABS: 5.0000 mg | ORAL_TABLET | Freq: Two times a day (BID) | ORAL | 1 refills | Status: DC

## 2022-02-10 NOTE — Discharge Instructions (Signed)

## 2022-02-10 NOTE — TOC CM/SW Note (Signed)
Provided Eliquis discount card to [name] prior to discharge. States she is looking forward to going home today. Denies having any TOC needs. Daughter transporting home today.   Bianca Rolling, MSN, RN,BSN Inpatient [facility] Case Manager [phone]

## 2022-02-10 NOTE — Progress Notes (Signed)
Rounding Note    Patient Name: Bianca Wolf Date of Encounter: 02/10/2022  [place] Cardiologist: None   Subjective   Denies any chest pain or shortness of breath  Inpatient Medications    Scheduled Meds:  apixaban  5 mg Oral BID   metoprolol succinate  50 mg Oral Daily   pantoprazole  40 mg Oral Daily   sodium chloride flush  3 mL Intravenous Q12H   Continuous Infusions:   PRN Meds: acetaminophen **OR** acetaminophen, hydrALAZINE, ondansetron **OR** ondansetron (ZOFRAN) IV, mouth rinse   Vital Signs    Vitals:   02/10/22 0608 02/10/22 0627 02/10/22 0746 02/10/22 0947  BP: (!) 134/57  137/67 (!) 135/97  Pulse: 60  66 64  Resp: 17  (!) 26   Temp: 98.1 F (36.7 C)  97.6 F (36.4 C)   TempSrc: Oral  Oral   SpO2: 91% 96%    Weight:      Height:        Intake/Output Summary (Last 24 hours) at 02/10/2022 1150 Last data filed at 02/10/2022 0748 Gross per 24 hour  Intake 590 ml  Output --  Net 590 ml       02/06/2022   12:58 PM 12/21/2021    2:31 PM 11/27/2021    1:16 PM  Last 3 Weights  Weight (lbs) 166 lb 161 lb 12.8 oz 160 lb 12.8 oz  Weight (kg) 75.297 kg 73.392 kg 72.938 kg      Telemetry    NSR with rate 50s, brief SVT - Personally Reviewed  ECG    EKG 10/18, atrial flutter, HR 30s, bradycardic - Personally Reviewed  Physical Exam   GEN: No acute distress.   Neck: No JVD Cardiac: irregular, no murmurs, rubs, or gallops.  Respiratory: Clear to auscultation bilaterally. GI: Soft, nontender, non-distended  MS: No edema; No deformity. Neuro:  Nonfocal  Psych: Normal affect   Labs    High Sensitivity Troponin:  No results for input(s): "TROPONINIHS" in the last 720 hours.   Chemistry Recent Labs  Lab 02/08/22 0335 02/08/22 1501 02/09/22 0155 02/09/22 0836 02/10/22 0232  NA 137 137 137  --  136  K 5.3* 4.6 5.8* 4.4 4.7  CL 102 101 100  --  103  CO2 28 26 29   --  27  GLUCOSE 109* 140* 120*  --  109*  BUN 14 16 18    --  24*  CREATININE 1.16* 1.17* 1.06*  --  1.26*  CALCIUM 9.5 9.2 9.5  --  9.3  MG 1.8  --  1.8  --  1.8  PROT 6.7  --  6.8  --  6.8  ALBUMIN 3.2*  --  3.4*  --  3.4*  AST 16  --  17  --  28  ALT 13  --  15  --  25  ALKPHOS 91  --  88  --  88  BILITOT 0.5  --  0.8  --  0.7  GFRNONAA 47* 47* 52*  --  43*  ANIONGAP 7 10 8   --  6     Lipids No results for input(s): "CHOL", "TRIG", "HDL", "LABVLDL", "LDLCALC", "CHOLHDL" in the last 168 hours.  Hematology Recent Labs  Lab 02/08/22 0335 02/09/22 0155 02/10/22 0232  WBC 7.5 8.8 8.3  RBC 4.04 4.13 3.81*  HGB 12.1 12.5 11.6*  HCT 37.9 38.1 35.3*  MCV 93.8 92.3 92.7  MCH 30.0 30.3 30.4  MCHC 31.9 32.8 32.9  RDW  12.9 12.9 12.8  PLT 259 255 235    Thyroid  Recent Labs  Lab 02/06/22 1327  TSH 1.223     BNPNo results for input(s): "BNP", "PROBNP" in the last 168 hours.  DDimer No results for input(s): "DDIMER" in the last 168 hours.   Radiology    ECHO TEE  Result Date: 02/09/2022    TRANSESOPHOGEAL ECHO REPORT   Patient Name:   Bianca Wolf Date of Exam: 02/09/2022 Medical Rec #:  [id number]      Height:       64.0 in Accession #:    [id number]     Weight:       166.0 lb Date of Birth:  1940/10/11      BSA:          1.807 m Patient Age:    81 years       BP:           138/78 mmHg Patient Gender: F              HR:           89 bpm. Exam Location:  Inpatient Procedure: Cardiac Doppler, Color Doppler, Transesophageal Echo and Saline            Contrast Bubble Study Indications:     I48.91* Unspeicified atrial fibrillation  History:         Patient has prior history of Echocardiogram examinations, most                  recent 02/07/2022. Arrythmias:Atrial Fibrillation; Risk                  Factors:Hypertension and GERD.  Sonographer:     Bernadene Person RDCS Referring Phys:  [id number] Margie Billet Diagnosing Phys: Cherlynn Kaiser MD PROCEDURE: After discussion of the risks and benefits of a TEE, an informed consent was obtained from the  patient. TEE procedure time was 17 minutes. The transesophogeal probe was passed without difficulty through the esophogus of the patient. Imaged were obtained with the patient in a left lateral decubitus position. Local oropharyngeal anesthetic was provided with Cetacaine. Sedation performed by different physician. The patient was monitored while under deep sedation. Anesthestetic sedation was provided intravenously by Anesthesiology: 578.4mg  of Propofol. Image quality was good. The patient's vital signs; including heart rate, blood pressure, and oxygen saturation; remained stable throughout the procedure. The patient developed no complications during the procedure. A successful direct current cardioversion was performed at 120 joules with 1 attempt. IMPRESSIONS  1. Left ventricular ejection fraction, by estimation, is 50%. The left ventricle has low normal function.  2. Right ventricular systolic function is mildly reduced. The right ventricular size is mildly enlarged.  3. Left atrial size was moderately dilated. No left atrial/left atrial appendage thrombus was detected. The LAA emptying velocity was 30 cm/s.  4. Right atrial size was moderately dilated.  5. A small pericardial effusion is present.  6. The mitral valve is grossly normal. Trivial mitral valve regurgitation.  7. Tricuspid valve regurgitation is moderate.  8. The aortic valve is grossly normal. Aortic valve regurgitation is not visualized.  9. 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 10. Agitated saline contrast bubble study was negative, with no evidence of any interatrial shunt. FINDINGS  Left Ventricle: Left ventricular ejection fraction, by estimation, is 50%. The left ventricle has low normal function. The left ventricular internal cavity size was normal in size.  Right Ventricle: Prominent moderator band. The right ventricular size is mildly enlarged. No increase in right ventricular wall  thickness. Right ventricular systolic function is mildly reduced. Left Atrium: Left atrial size was moderately dilated. No left atrial/left atrial appendage thrombus was detected. The LAA emptying velocity was 30 cm/s. Right Atrium: Right atrial size was moderately dilated. Pericardium: A small pericardial effusion is present. Mitral Valve: The mitral valve is grossly normal. Trivial mitral valve regurgitation. Tricuspid Valve: The tricuspid valve is grossly normal. Tricuspid valve regurgitation is moderate. Aortic Valve: The aortic valve is grossly normal. Aortic valve regurgitation is not visualized. Pulmonic Valve: The pulmonic valve was normal in structure. Pulmonic valve regurgitation is trivial. Aorta: The aortic root and ascending aorta are structurally normal, with no evidence of dilitation. There is severe (Grade IV) atheroma plaque involving the aortic arch and descending aorta. IAS/Shunts: No atrial level shunt detected by color flow Doppler. Agitated saline contrast was given intravenously to evaluate for intracardiac shunting. Agitated saline contrast bubble study was negative, with no evidence of any interatrial shunt. Additional Comments: Spectral Doppler performed.  LEFT VENTRICLE PLAX 2D LVOT diam:     1.80 cm LVOT Area:     2.54 cm   AORTA Ao Root diam: 2.80 cm Ao Asc diam:  3.00 cm TRICUSPID VALVE TR Peak grad:   27.9 mmHg TR Vmax:        264.00 cm/s  SHUNTS Systemic Diam: 1.80 cm Cherlynn Kaiser MD Electronically signed by Cherlynn Kaiser MD Signature Date/Time: 02/09/2022/2:34:45 PM    Final     Cardiac Studies   Echo 02/07/2022  1. Left ventricular ejection fraction, by estimation, is 45 to 50%. The  left ventricle has mildly decreased function. The left ventricle  demonstrates global hypokinesis. There is mild concentric left ventricular  hypertrophy. Left ventricular diastolic  parameters are indeterminate.   2. Right ventricular systolic function is mildly reduced. The right   ventricular size is normal. There is mildly elevated pulmonary artery  systolic pressure. The estimated right ventricular systolic pressure is  [id number] mmHg.   3. Left atrial size was mildly dilated.   4. The mitral valve is normal in structure. No evidence of mitral valve  regurgitation. No evidence of mitral stenosis.   5. The aortic valve was not well visualized. Aortic valve regurgitation  is not visualized. No aortic stenosis is present.   6. The inferior vena cava is normal in size with greater than 50%  respiratory variability, suggesting right atrial pressure of 3 mmHg.   7. Technically difficult study with poor acoustic windows.   Patient Profile     81 y.o. female with PMH of HTN presented with new onset of atrial fibrillation  Assessment & Plan    Newly diagnosed atrial fibrillation/atrial flutter  -Continue Eliquis 5 mg twice daily  -Continue metoprolol, dose consolidated to Toprol-XL 50 mg daily  -Successful TEE/DCCV on 10/20, appears to be maintaining sinus rhythm  Acute CHF: EF 45-50% on echo in 02/07/2022, suspect tachy-mediated. IV diuretic held after Cr bump and patient achieved euvolemic level.  Plan repeat echocardiogram in 3 months to evaluate for improvement  HTN: home benazepril held, continue toprol XL   [place] will sign off.   Medication Recommendations: Toprol-XL 50 mg daily, Eliquis 5 mg BID Other recommendations (labs, testing, etc): None Follow up as an outpatient: Will schedule        For questions or updates, please contact [facility] Please consult [email or web address] for contact info  under        Signed, Donato Heinz, MD  02/10/2022, 11:50 AM

## 2022-02-10 NOTE — Progress Notes (Signed)
Nsg Discharge Note  Admit Date:  02/06/2022 Discharge date: 02/10/2022   Bianca Wolf to be D/C'd Home per MD order.  AVS completed. Patient/caregiver able to verbalize understanding.  Discharge Medication: Allergies as of 02/10/2022   No Known Allergies      Medication List     STOP taking these medications    benazepril 20 MG tablet Commonly known as: LOTENSIN       TAKE these medications    acetaminophen 325 MG tablet Commonly known as: Tylenol Take 2 tablets (650 mg total) by mouth every 6 (six) hours as needed.   apixaban 5 MG Tabs tablet Commonly known as: ELIQUIS Take 1 tablet (5 mg total) by mouth 2 (two) times daily.   calcium citrate 950 (200 Ca) MG tablet Commonly known as: CALCITRATE - dosed in mg elemental calcium Take 200 mg of elemental calcium by mouth daily.   metoprolol succinate 50 MG 24 hr tablet Commonly known as: TOPROL-XL Take 1 tablet (50 mg total) by mouth daily. Take with or immediately following a meal. Start taking on: February 11, 2022   omeprazole 20 MG capsule Commonly known as: PRILOSEC Take 20 mg by mouth daily.   Prolia 60 MG/ML Sosy injection Generic drug: denosumab Inject 60 mg into the skin every 6 (six) months.   Vitamin D-3 5000 UNIT/ML Liqd Take 1 capsule by mouth daily.        Discharge Assessment: Vitals:   02/10/22 0800 02/10/22 0947  BP:  (!) 135/97  Pulse:  64  Resp: 20 20  Temp:    SpO2:     Skin clean, dry and intact without evidence of skin break down, no evidence of skin tears noted. IV catheter discontinued intact. Site without signs and symptoms of complications - no redness or edema noted at insertion site, patient denies c/o pain - only slight tenderness at site.  Dressing with slight pressure applied.  D/c Instructions-Education: Discharge instructions given to patient/family with verbalized understanding. D/c education completed with patient/family including follow up instructions,  medication list, d/c activities limitations if indicated, with other d/c instructions as indicated by MD - patient able to verbalize understanding, all questions fully answered. Patient instructed to return to ED, call 911, or call MD for any changes in condition.  Patient escorted via [place], and D/C home via private auto.  Atilano Ina, RN 02/10/2022 1:00 PM

## 2022-02-10 NOTE — Discharge Summary (Signed)
Physician Discharge Summary  Bianca Wolf [id number] DOB: January 21, 1941 DOA: 02/06/2022  PCP: Bianca Pretty, MD  Admit date: 02/06/2022 Discharge date: 02/10/2022  Time spent: 40 minutes  Recommendations for Outpatient Follow-up:  Follow CBC/CMP Follow with cards outpatient Consider statin outpatient for grade IV plaque Holding benazepril, follow BP outpatient    Discharge Diagnoses:  Principal Problem:   Atrial fibrillation with RVR ([place]) Active Problems:   Age-related osteoporosis without current pathological fracture   Essential hypertension   New onset Bianca Wolf)   Discharge Condition: stable  Diet recommendation: heart healthy   Filed Weights   02/06/22 1258  Weight: 75.3 kg    History of present illness:  Bianca Wolf is Bianca Wolf 81 y.o. female with medical history significant of HTN presenting with palpitations. She was found to have new onset atrial fibrillation with RVR.  Echo showed mildly reduced EF.  Cards was c/s.  Now s/p TEE/DCCV.  Stable in sinus rhythm at time of discharge.  See below for additional details  Hospital Course:  Assessment and Plan: New onset Afib with RVR  - Patient presenting with new-onset afib.  - TSH wnl.  Related to longstanding HTN? - echo with EF 45-50%, mildly reduced RVSF, mildly elevated PASP - CHA2DS2-VASc Score is at least 29 (age, sex, htn)  - continue eliquis, metop  - s/p TEE cardioversion, now in sinus rhythm - appreciate further cards recs   AKI  Hyperkalemia - improved - continue holding benazepril  - will consider d/c at discharge   Mildly Decreased EF  RVSF mildly reduced - rate related?  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 Left ventricular ejection fraction, by estimation, is 50%. The left  ventricle has low normal function.   2. Right ventricular systolic function is mildly reduced. The right  ventricular size is mildly enlarged.   3. Left atrial size was moderately dilated. No left atrial/left atrial  appendage thrombus was detected. The LAA emptying velocity was 30 cm/s.   4. Right atrial size was moderately dilated.   5. Bianca Wolf small pericardial effusion is present.   6. The mitral valve is grossly normal. Trivial mitral valve  regurgitation.   7. Tricuspid valve regurgitation is moderate.   8. The aortic valve is grossly normal. Aortic valve regurgitation is not  visualized.   9. 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  10. Agitated saline contrast bubble study was negative, with no evidence  of any interatrial shunt.    IMPRESSIONS     1. Left ventricular ejection fraction, by estimation, is 45 to 50%. The  left ventricle has mildly decreased function. The left ventricle  demonstrates global hypokinesis. There is mild concentric left ventricular  hypertrophy. Left ventricular diastolic  parameters are indeterminate.   2. Right ventricular systolic function is mildly reduced. The right  ventricular size is normal. There is mildly elevated pulmonary artery  systolic pressure. The estimated right ventricular systolic pressure is  Q000111Q mmHg.   3. Left atrial size  was mildly dilated.   4. The mitral valve is normal in structure. No evidence of mitral valve  regurgitation. No evidence of mitral stenosis.   5. The aortic valve was not well visualized. Aortic valve regurgitation  is not visualized. No aortic stenosis is present.   6. The inferior vena cava is normal in size with  greater than 50%  respiratory variability, suggesting right atrial pressure of 3 mmHg.   7. Technically difficult study with poor acoustic windows.  Consultations: cardiology  Discharge Exam: Vitals:   02/10/22 0746 02/10/22 0947  BP: 137/67 (!) 135/97  Pulse: 66 64  Resp: (!) 26   Temp: 97.6 F (36.4 C)   SpO2:     Eager to discharge  General: No acute distress. Cardiovascular: Heart sounds show Bianca Wolf regular rate, and rhythm.  Lungs: Clear to auscultation bilaterally  Abdomen: Soft, nontender, nondistended  Neurological: Alert and oriented 3. Moves all extremities 4 with equal strength. Cranial nerves II through XII grossly intact. Skin: Warm and dry. No rashes or lesions. Extremities: No clubbing or cyanosis. No edema.   Discharge Instructions   Discharge Instructions     Amb referral to AFIB Clinic   Complete by: As directed    Call MD for:  difficulty breathing, headache or visual disturbances   Complete by: As directed    Call MD for:  extreme fatigue   Complete by: As directed    Call MD for:  hives   Complete by: As directed    Call MD for:  persistant dizziness or light-headedness   Complete by: As directed    Call MD for:  persistant nausea and vomiting   Complete by: As directed    Call MD for:  redness, tenderness, or signs of infection (pain, swelling, redness, odor or green/yellow discharge around incision site)   Complete by: As directed    Call MD for:  severe uncontrolled pain   Complete by: As directed    Call MD for:  temperature >100.4   Complete by: As directed    Diet - low sodium heart healthy   Complete by: As directed    Discharge instructions   Complete by: As directed    You were seen for new onset atrial fibrillation.  We've started you on eliquis to help prevent strokes.  You've also been started on metoprolol to help control your heart rate.   Your ejection fraction (squeeze) of your heart was slightly reduced.  Follow with  cardiology regarding this going forward.  Please follow up with cardiology as an outpatient.  I'm recommending you stop your benazepril for now until you get follow up labs with your PCP.  The echo also showed Gordie Belvin plaque that needs follow up with cardiology.  They will consider Samyukta Cura statin outpatient for you.  You should follow up your kidney function and electrolytes with your PCP in week or so.  Return for new, recurrent, or worsening symptoms.  Please ask your PCP to request records from this hospitalization so they know what was done and what the next steps will be.   Increase activity slowly   Complete by: As directed       Allergies as of 02/10/2022   No Known Allergies      Medication List     STOP taking these medications    benazepril 20 MG tablet Commonly known as: LOTENSIN       TAKE these medications    acetaminophen 325 MG tablet Commonly known  as: Tylenol Take 2 tablets (650 mg total) by mouth every 6 (six) hours as needed.   apixaban 5 MG Tabs tablet Commonly known as: ELIQUIS Take 1 tablet (5 mg total) by mouth 2 (two) times daily.   calcium citrate 950 (200 Ca) MG tablet Commonly known as: CALCITRATE - dosed in mg elemental calcium Take 200 mg of elemental calcium by mouth daily.   metoprolol succinate 50 MG 24 hr tablet Commonly known as: TOPROL-XL Take 1 tablet (50 mg total) by mouth daily. Take with or immediately following Mirza Fessel meal. Start taking on: February 11, 2022   omeprazole 20 MG capsule Commonly known as: PRILOSEC Take 20 mg by mouth daily.   Prolia 60 MG/ML Sosy injection Generic drug: denosumab Inject 60 mg into the skin every 6 (six) months.   Vitamin D-3 5000 UNIT/ML Liqd Take 1 capsule by mouth daily.       No Known Allergies    The results of significant diagnostics from this hospitalization (including imaging, microbiology, ancillary and laboratory) are listed below for reference.    Significant Diagnostic  Studies: ECHO TEE  Result Date: 02/09/2022    TRANSESOPHOGEAL ECHO REPORT   Patient Name:   TENEE WISH Date of Exam: 02/09/2022 Medical Rec #:  [id number]      Height:       64.0 in Accession #:    [id number]     Weight:       166.0 lb Date of Birth:  April 11, 1941      BSA:          1.807 m Patient Age:    49 years       BP:           138/78 mmHg Patient Gender: F              HR:           89 bpm. Exam Location:  Inpatient Procedure: Cardiac Doppler, Color Doppler, Transesophageal Echo and Saline            Contrast Bubble Study Indications:     I48.91* Unspeicified atrial fibrillation  History:         Patient has prior history of Echocardiogram examinations, most                  recent 02/07/2022. Arrythmias:Atrial Fibrillation; Risk                  Factors:Hypertension and GERD.  Sonographer:     Bernadene Person RDCS Referring Phys:  [id number] Margie Billet Diagnosing Phys: Cherlynn Kaiser MD PROCEDURE: After discussion of the risks and benefits of Corby Villasenor TEE, an informed consent was obtained from the patient. TEE procedure time was 17 minutes. The transesophogeal probe was passed without difficulty through the esophogus of the patient. Imaged were obtained with the patient in Tecora Eustache left lateral decubitus position. Local oropharyngeal anesthetic was provided with Cetacaine. Sedation performed by different physician. The patient was monitored while under deep sedation. Anesthestetic sedation was provided intravenously by Anesthesiology: 578.4mg  of Propofol. Image quality was good. The patient's vital signs; including heart rate, blood pressure, and oxygen saturation; remained stable throughout the procedure. The patient developed no complications during the procedure. Savahna Casados successful direct current cardioversion was performed at 120 joules with 1 attempt. IMPRESSIONS  1. Left ventricular ejection fraction, by estimation, is 50%. The left ventricle has low normal function.  2. Right ventricular systolic function is mildly  reduced. The right ventricular size is  mildly enlarged.  3. Left atrial size was moderately dilated. No left atrial/left atrial appendage thrombus was detected. The LAA emptying velocity was 30 cm/s.  4. Right atrial size was moderately dilated.  5. Ajee Heasley small pericardial effusion is present.  6. The mitral valve is grossly normal. Trivial mitral valve regurgitation.  7. Tricuspid valve regurgitation is moderate.  8. The aortic valve is grossly normal. Aortic valve regurgitation is not visualized.  9. 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 10. Agitated saline contrast bubble study was negative, with no evidence of any interatrial shunt. FINDINGS  Left Ventricle: Left ventricular ejection fraction, by estimation, is 50%. The left ventricle has low normal function. The left ventricular internal cavity size was normal in size. Right Ventricle: Prominent moderator band. The right ventricular size is mildly enlarged. No increase in right ventricular wall thickness. Right ventricular systolic function is mildly reduced. Left Atrium: Left atrial size was moderately dilated. No left atrial/left atrial appendage thrombus was detected. The LAA emptying velocity was 30 cm/s. Right Atrium: Right atrial size was moderately dilated. Pericardium: Jahel Wavra small pericardial effusion is present. Mitral Valve: The mitral valve is grossly normal. Trivial mitral valve regurgitation. Tricuspid Valve: The tricuspid valve is grossly normal. Tricuspid valve regurgitation is moderate. Aortic Valve: The aortic valve is grossly normal. Aortic valve regurgitation is not visualized. Pulmonic Valve: The pulmonic valve was normal in structure. Pulmonic valve regurgitation is trivial. Aorta: The aortic root and ascending aorta are structurally normal, with no evidence of dilitation. There is severe (Grade IV) atheroma plaque involving the aortic arch and descending aorta. IAS/Shunts: No atrial level shunt  detected by color flow Doppler. Agitated saline contrast was given intravenously to evaluate for intracardiac shunting. Agitated saline contrast bubble study was negative, with no evidence of any interatrial shunt. Additional Comments: Spectral Doppler performed.  LEFT VENTRICLE PLAX 2D LVOT diam:     1.80 cm LVOT Area:     2.54 cm   AORTA Ao Root diam: 2.80 cm Ao Asc diam:  3.00 cm TRICUSPID VALVE TR Peak grad:   27.9 mmHg TR Vmax:        264.00 cm/s  SHUNTS Systemic Diam: 1.80 cm Cherlynn Kaiser MD Electronically signed by Cherlynn Kaiser MD Signature Date/Time: 02/09/2022/2:34:45 PM    Final    ECHOCARDIOGRAM COMPLETE  Result Date: 02/07/2022    ECHOCARDIOGRAM REPORT   Patient Name:   ALEKSA ARAMBUL Date of Exam: 02/07/2022 Medical Rec #:  [id number]      Height:       64.0 in Accession #:    [id number]     Weight:       166.0 lb Date of Birth:  1940/09/03      BSA:          1.807 m Patient Age:    55 years       BP:           137/89 mmHg Patient Gender: F              HR:           85 bpm. Exam Location:  Inpatient Procedure: 2D Echo, Color Doppler, Cardiac Doppler and 3D Echo Indications:    Atrial Fibrillation I48.91  History:        Patient has no prior history of Echocardiogram examinations.                 Arrythmias:Palpitations.; Risk Factors:Hypertension.  Sonographer:  Darlina Sicilian RDCS Referring Phys: [place]  1. Left ventricular ejection fraction, by estimation, is 45 to 50%. The left ventricle has mildly decreased function. The left ventricle demonstrates global hypokinesis. There is mild concentric left ventricular hypertrophy. Left ventricular diastolic parameters are indeterminate.  2. Right ventricular systolic function is mildly reduced. The right ventricular size is normal. There is mildly elevated pulmonary artery systolic pressure. The estimated right ventricular systolic pressure is Q000111Q mmHg.  3. Left atrial size was mildly dilated.  4. The mitral valve is  normal in structure. No evidence of mitral valve regurgitation. No evidence of mitral stenosis.  5. The aortic valve was not well visualized. Aortic valve regurgitation is not visualized. No aortic stenosis is present.  6. The inferior vena cava is normal in size with greater than 50% respiratory variability, suggesting right atrial pressure of 3 mmHg.  7. Technically difficult study with poor acoustic windows. FINDINGS  Left Ventricle: Left ventricular ejection fraction, by estimation, is 45 to 50%. The left ventricle has mildly decreased function. The left ventricle demonstrates global hypokinesis. The left ventricular internal cavity size was normal in size. There is  mild concentric left ventricular hypertrophy. Left ventricular diastolic parameters are indeterminate. Right Ventricle: The right ventricular size is normal. No increase in right ventricular wall thickness. Right ventricular systolic function is mildly reduced. There is mildly elevated pulmonary artery systolic pressure. The tricuspid regurgitant velocity  is 3.05 m/s, and with an assumed right atrial pressure of 3 mmHg, the estimated right ventricular systolic pressure is Q000111Q mmHg. Left Atrium: Left atrial size was mildly dilated. Right Atrium: Right atrial size was normal in size. Pericardium: There is no evidence of pericardial effusion. Mitral Valve: The mitral valve is normal in structure. No evidence of mitral valve regurgitation. No evidence of mitral valve stenosis. Tricuspid Valve: The tricuspid valve is normal in structure. Tricuspid valve regurgitation is mild. Aortic Valve: The aortic valve was not well visualized. Aortic valve regurgitation is not visualized. No aortic stenosis is present. Pulmonic Valve: The pulmonic valve was normal in structure. Pulmonic valve regurgitation is not visualized. Aorta: The aortic root is normal in size and structure. Venous: The inferior vena cava is normal in size with greater than 50% respiratory  variability, suggesting right atrial pressure of 3 mmHg. IAS/Shunts: No atrial level shunt detected by color flow Doppler.  LEFT VENTRICLE PLAX 2D LVIDd:         4.70 cm   Diastology LVIDs:         3.50 cm   LV e' medial:    5.66 cm/s LV PW:         1.00 cm   LV E/e' medial:  18.5 LV IVS:        1.20 cm   LV e' lateral:   8.24 cm/s LVOT diam:     1.60 cm   LV E/e' lateral: 12.7 LV SV:         30 LV SV Index:   16 LVOT Area:     2.01 cm                           3D Volume EF:                          3D EF:        41 %  LV EDV:       108 ml                          LV ESV:       63 ml                          LV SV:        44 ml RIGHT VENTRICLE RV Basal diam:  3.90 cm RV Mid diam:    2.30 cm RV S prime:     6.73 cm/s TAPSE (M-mode): 0.8 cm LEFT ATRIUM             Index        RIGHT ATRIUM           Index LA diam:        4.00 cm 2.21 cm/m   RA Area:     13.10 cm LA Vol (A2C):   46.4 ml 25.67 ml/m  RA Volume:   27.20 ml  15.05 ml/m LA Vol (A4C):   65.5 ml 36.24 ml/m LA Biplane Vol: 56.8 ml 31.43 ml/m  AORTIC VALVE LVOT Vmax:   74.40 cm/s LVOT Vmean:  54.900 cm/s LVOT VTI:    0.147 m  AORTA Ao Root diam: 2.60 cm MITRAL VALVE                TRICUSPID VALVE MV Area (PHT): 5.19 cm     TR Peak grad:   37.2 mmHg MV Decel Time: 146 msec     TR Vmax:        305.00 cm/s MV E velocity: 104.88 cm/s                             SHUNTS                             Systemic VTI:  0.15 m                             Systemic Diam: 1.60 cm Dalton McleanMD Electronically signed by Franki Monte Signature Date/Time: 02/07/2022/5:23:48 PM    Final    DG Chest 2 View  Result Date: 02/06/2022 CLINICAL DATA:  Shortness of breath for 1 month. EXAM: CHEST - 2 VIEW COMPARISON:  Chest/rib radiographs 10/11/2015 FINDINGS: The cardiac silhouette is borderline enlarged. The lungs are hyperinflated with mild chronic peribronchial thickening. No acute airspace consolidation, edema, pleural effusion, or  pneumothorax is identified. No acute osseous abnormality is seen. IMPRESSION: Chronic bronchitic changes. No evidence of acute cardiopulmonary process. Electronically Signed   By: Logan Bores M.D.   On: 02/06/2022 13:43    Microbiology: No results found for this or any previous visit (from the past 240 hour(s)).   Labs: Basic Metabolic Panel: Recent Labs  Lab 02/06/22 1531 02/07/22 0129 02/08/22 0335 02/08/22 1501 02/09/22 0155 02/09/22 0836 02/10/22 0232  NA  --  139 137 137 137  --  136  K  --  3.8 5.3* 4.6 5.8* 4.4 4.7  CL  --  103 102 101 100  --  103  CO2  --  29 28 26 29   --  27  GLUCOSE  --  122* 109* 140* 120*  --  109*  BUN  --  8 14  16 18  --  24*  CREATININE  --  0.75 1.16* 1.17* 1.06*  --  1.26*  CALCIUM  --  9.0 9.5 9.2 9.5  --  9.3  MG 1.7  --  1.8  --  1.8  --  1.8  PHOS  --   --  3.5  --  4.0  --  4.0   Liver Function Tests: Recent Labs  Lab 02/08/22 0335 02/09/22 0155 02/10/22 0232  AST 16 17 28   ALT 13 15 25   ALKPHOS 91 88 88  BILITOT 0.5 0.8 0.7  PROT 6.7 6.8 6.8  ALBUMIN 3.2* 3.4* 3.4*   No results for input(s): "LIPASE", "AMYLASE" in the last 168 hours. No results for input(s): "AMMONIA" in the last 168 hours. CBC: Recent Labs  Lab 02/06/22 1327 02/07/22 0129 02/08/22 0335 02/09/22 0155 02/10/22 0232  WBC 8.0 8.3 7.5 8.8 8.3  NEUTROABS  --   --  5.1 6.4 6.0  HGB 11.7* 11.2* 12.1 12.5 11.6*  HCT 38.0 35.1* 37.9 38.1 35.3*  MCV 95.5 94.6 93.8 92.3 92.7  PLT 270 241 259 255 235   Cardiac Enzymes: No results for input(s): "CKTOTAL", "CKMB", "CKMBINDEX", "TROPONINI" in the last 168 hours. BNP: BNP (last 3 results) No results for input(s): "BNP" in the last 8760 hours.  ProBNP (last 3 results) No results for input(s): "PROBNP" in the last 8760 hours.  CBG: No results for input(s): "GLUCAP" in the last 168 hours.     Signed:  Fayrene Helper MD.  [facility] 02/10/2022, 12:17 PM

## 2022-02-12 ENCOUNTER — Encounter (HOSPITAL_COMMUNITY): Payer: Self-pay | Admitting: Internal Medicine

## 2022-02-14 ENCOUNTER — Telehealth: Payer: Self-pay | Admitting: Cardiology

## 2022-02-14 NOTE — Telephone Encounter (Signed)
Patient stated she has felt increasingly tired since her cardioversion. She has exertional Sob. She questioned if she should have had the cardioversion. She doesn't want to "live this way" (being tired all the time). Appointment made with Dr. Gardiner Rhyme for 10/26.

## 2022-02-14 NOTE — Telephone Encounter (Signed)
  Pt c/o medication issue:  1. Name of Medication: apixaban (ELIQUIS) 5 MG TABS tablet  2. How are you currently taking this medication (dosage and times per day)? Take 1 tablet (5 mg total) by mouth 2 (two) times daily.  3. Are you having a reaction (difficulty breathing--STAT)? No   4. What is your medication issue? Pt is calling, she said, she's been feeling tired since her cardioversion and she feels like her new medication eliquis is causing it. She wants to know what she can do about it

## 2022-02-15 ENCOUNTER — Ambulatory Visit: Payer: Medicare HMO | Attending: Cardiology | Admitting: Cardiology

## 2022-02-15 ENCOUNTER — Encounter: Payer: Self-pay | Admitting: Cardiology

## 2022-02-15 VITALS — BP 142/68 | HR 54 | Ht 63.0 in | Wt 169.0 lb

## 2022-02-15 DIAGNOSIS — I5021 Acute systolic (congestive) heart failure: Secondary | ICD-10-CM | POA: Diagnosis not present

## 2022-02-15 DIAGNOSIS — I10 Essential (primary) hypertension: Secondary | ICD-10-CM | POA: Diagnosis not present

## 2022-02-15 DIAGNOSIS — I4819 Other persistent atrial fibrillation: Secondary | ICD-10-CM

## 2022-02-15 DIAGNOSIS — R400 Somnolence: Secondary | ICD-10-CM | POA: Diagnosis not present

## 2022-02-15 MED ORDER — METOPROLOL SUCCINATE ER 25 MG PO TB24: 25.0000 mg | ORAL_TABLET | Freq: Every day | ORAL | 3 refills | Status: DC

## 2022-02-15 NOTE — Progress Notes (Signed)
Cardiology Office Note:    Date:  02/15/2022   ID:  Azara, Gemme Dec 01, 1940, MRN [id number]  PCP:  [name], MD  Cardiologist:  None  Electrophysiologist:  None   Referring MD: [name], MD   Chief Complaint  Patient presents with   Atrial Fibrillation    History of Present Illness:    Bianca Wolf is a 81 y.o. female with a hx of persistent atrial fibrillation, hypertension who presents for follow-up.  She was admitted 02/06/2022 with palpitations, found to have new onset atrial fibrillation with RVR.  Echocardiogram showed EF 45 to 27%, mild RV systolic dysfunction.  She was started on Eliquis and metoprolol.  Underwent successful TEE/DCCV on 02/09/2022.  Since discharge from the hospital, she reports that she has been feeling short of breath and very tired.  States that she feels tired all the time.  Denies any chest pain, lightheadedness, or syncope.    Past Medical History:  Diagnosis Date   Atrial fibrillation ([place])    GERD (gastroesophageal reflux disease)    Hypertension     Past Surgical History:  Procedure Laterality Date   BUBBLE STUDY  02/09/2022   Procedure: BUBBLE STUDY;  Surgeon: Elouise Munroe, MD;  Location: [place];  Service: Cardiovascular;;   CARDIOVERSION N/A 02/09/2022   Procedure: CARDIOVERSION;  Surgeon: Elouise Munroe, MD;  Location: [place];  Service: Cardiovascular;  Laterality: N/A;   IR KYPHO LUMBAR INC FX REDUCE BONE BX UNI/BIL CANNULATION INC/IMAGING  09/21/2021   TEE WITHOUT CARDIOVERSION N/A 02/09/2022   Procedure: TRANSESOPHAGEAL ECHOCARDIOGRAM (TEE);  Surgeon: Elouise Munroe, MD;  Location: [facility] ENDOSCOPY;  Service: Cardiovascular;  Laterality: N/A;    Current Medications: Current Meds  Medication Sig   acetaminophen (TYLENOL) 325 MG tablet Take 2 tablets (650 mg total) by mouth every 6 (six) hours as needed.   apixaban (ELIQUIS) 5 MG TABS tablet Take 1 tablet (5 mg total) by mouth 2 (two) times  daily.   calcium citrate (CALCITRATE - DOSED IN MG ELEMENTAL CALCIUM) 950 (200 Ca) MG tablet Take 200 mg of elemental calcium by mouth daily.   Cholecalciferol (VITAMIN D-3) 5000 UNIT/ML LIQD Take 1 capsule by mouth daily.   metoprolol succinate (TOPROL-XL) 50 MG 24 hr tablet Take 1 tablet (50 mg total) by mouth daily. Take with or immediately following a meal.     Allergies:   Patient has no known allergies.   Social History   Socioeconomic History   Marital status: Widowed    Spouse name: Not on file   Number of children: Not on file   Years of education: Not on file   Highest education level: Not on file  Occupational History   Not on file  Tobacco Use   Smoking status: Former    Packs/day: 1.00    Years: 25.00    Total pack years: 25.00    Types: Cigarettes    Quit date: 39    Years since quitting: 43.8   Smokeless tobacco: Never  Substance and Sexual Activity   Alcohol use: No   Drug use: No   Sexual activity: Yes    Birth control/protection: None  Other Topics Concern   Not on file  Social History Narrative   Not on file   Social Determinants of Health   Financial Resource Strain: Not on file  Food Insecurity: Not on file  Transportation Needs: Not on file  Physical Activity: Not on file  Stress: Not on file  Social Connections: Not on file     Family History: The patient's family history is negative for Atrial fibrillation.  ROS:   Please see the history of present illness.     All other systems reviewed and are negative.  EKGs/Labs/Other Studies Reviewed:    The following studies were reviewed today:   EKG:   02/15/22: Sinus bradycardia, rate 54, no ST abnormalities  Recent Labs: 02/06/2022: TSH 1.223 02/10/2022: ALT 25; BUN 24; Creatinine, Ser 1.26; Hemoglobin 11.6; Magnesium 1.8; Platelets 235; Potassium 4.7; Sodium 136  Recent Lipid Panel No results found for: "CHOL", "TRIG", "HDL", "CHOLHDL", "VLDL", "LDLCALC", "LDLDIRECT"  Physical  Exam:    VS:  BP (!) 142/68   Pulse (!) 54   Ht 5\' 3"  (1.6 m)   Wt 169 lb (76.7 kg)   SpO2 95%   BMI 29.94 kg/m     Wt Readings from Last 3 Encounters:  02/15/22 169 lb (76.7 kg)  02/06/22 166 lb (75.3 kg)  12/21/21 161 lb 12.8 oz (73.4 kg)     GEN:  in no acute distress HEENT: Normal NECK: No JVD; No carotid bruits LYMPHATICS: No lymphadenopathy CARDIAC: bradycardic, regular, no murmurs, rubs, gallops RESPIRATORY:  Clear to auscultation without rales, wheezing or rhonchi  ABDOMEN: Soft, non-tender, non-distended MUSCULOSKELETAL:  No edema; No deformity  SKIN: Warm and dry NEUROLOGIC:  Alert and oriented x 3 PSYCHIATRIC:  Normal affect   ASSESSMENT:    1. Persistent atrial fibrillation (HCC)   2. Acute systolic heart failure (HCC)   3. Essential hypertension   4. Daytime somnolence    PLAN:    Persistent atrial fibrillation: She was admitted 02/06/2022 with palpitations, found to have new onset atrial fibrillation with RVR.  Echocardiogram showed EF 45 to 50%, mild RV systolic dysfunction.  She was started on Eliquis and metoprolol.  Underwent successful TEE/DCCV on 02/09/2022.  CHA2DS2-VASc score 5 (CHF, hypertension, age x2, female) -Continue Eliquis 5 mg twice daily -Suspect her fatigue is due to her bradycardia.  Recommend decrease Toprol-XL to 25 mg daily -Sleep study  Acute systolic heart failure: EF 45 to 50% on echo 02/07/2022.  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  We will monitor for improvement with maintaining sinus rhythm -Continue Toprol-XL, will decrease dose as above  Hypertension: Continue Toprol-XL, will decrease dose as above  Daytime somnolence: Recommended Itamar sleep study.  STOPBANG 4     Medication Adjustments/Labs and Tests Ordered: Current medicines are reviewed at length with the patient today.  Concerns regarding medicines are outlined above.  No orders of the defined types were placed in this encounter.  No orders of the  defined types were placed in this encounter.   There are no Patient Instructions on file for this visit.   Signed, 02/09/2022, MD  02/15/2022 3:25 PM    [facility]

## 2022-02-15 NOTE — Patient Instructions (Signed)
Medication Instructions:  DECREASE metoprolol succinate (Toprol XL) to 25 mg daily  *If you need a refill on your cardiac medications before your next appointment, please call your pharmacy*  Testing/Procedures: WatchPAT?  Is a FDA cleared portable home sleep study test that uses a watch and 3 points of contact to monitor 7 different channels, including your heart rate, oxygen saturations, body position, snoring, and chest motion.  The study is easy to use from the comfort of your own home and accurately detect sleep apnea.  Before bed, you attach the chest sensor, attached the sleep apnea bracelet to your nondominant hand, and attach the finger probe.  After the study, the raw data is downloaded from the watch and scored for apnea events.   For more information: [email or web address]  Patient Testing Instructions:  Do not put battery into the device until bedtime when you are ready to begin the test. Please call the support number if you need assistance after following the instructions below: 24 hour support line- [phone] or ITAMAR support at [phone] (option 2)  Download the [employer] AmericanWatchPAT One" app through the google play store or App Store  Be sure to turn on or enable access to bluetooth in settlings on your smartphone/ device  Make sure no other bluetooth devices are on and within the vicinity of your smartphone/ device and WatchPAT watch during testing.  Make sure to leave your smart phone/ device plugged in and charging all night.  When ready for bed:  Follow the instructions step by step in the WatchPAT One App to activate the testing device. For additional instructions, including video instruction, visit the WatchPAT One video on Youtube. You can search for [place] One within Youtube (video is 4 minutes and 18 seconds) or enter: https://youtube/watch?v=BCce_vbiwxE Please note: You will be prompted to enter a Pin to connect via bluetooth when starting the  test. The PIN will be assigned to you when you receive the test.  The device is disposable, but it recommended that you retain the device until you receive a call letting you know the study has been received and the results have been interpreted.  We will let you know if the study did not transmit to [place] properly after the test is completed. You do not need to call us to confirm the receipt of the test.  Please complete the test within 48 hours of receiving PIN.   Frequently Asked Questions:  What is Watch [place] one?  A single use fully disposable home sleep apnea testing device and will not need to be returned after completion.  What are the requirements to use WatchPAT one?  The be able to have a successful watchpat one sleep study, you should have your Watch pat one device, your smart phone, watch pat one app, your PIN number and Internet access What type of phone do I need?  You should have a smart phone that uses Android 5.1 and above or any Iphone with IOS 10 and above How can I download the WatchPAT one app?  Based on your device type search for WatchPAT one app either in google play for android devices or APP store for Iphone's Where will I get my PIN for the study?  Your PIN will be provided by your physician's office. It is used for authentication and if you lose/forget your PIN, please reach out to your providers office.  I do not have Internet at home. Can I do WatchPAT one study?  WatchPAT One needs Internet connection  throughout the night to be able to transmit the sleep data. You can use your home/local internet or your cellular's data package. However, it is always recommended to use home/local Internet. It is estimated that between 20MB-30MB will be used with each study.However, the application will be looking for space in the phone to start the study.  What happens if I lose internet or bluetooth connection?  During the internet disconnection, your phone will not be able to  transmit the sleep data. All the data, will be stored in your phone. As soon as the internet connection is back on, the phone will being sending the sleep data. During the bluetooth disconnection, WatchPAT one will not be able to to send the sleep data to your phone. Data will be kept in the [facility] one until two devices have bluetooth connection back on. As soon as the connection is back on, WatchPAT one will send the sleep data to the phone.  How long do I need to wear the WatchPAT one?  After you start the study, you should wear the device at least 6 hours.  How far should I keep my phone from the device?  During the night, your phone should be within 15 feet.  What happens if I leave the room for restroom or other reasons?  Leaving the room for any reason will not cause any problem. As soon as your get back to the room, both devices will reconnect and will continue to send the sleep data. Can I use my phone during the sleep study?  Yes, you can use your phone as usual during the study. But it is recommended to put your watchpat one on when you are ready to go to bed.  How will I get my study results?  A soon as you completed your study, your sleep data will be sent to the provider. They will then share the results with you when they are ready.   Follow-Up: At [facility], you and your health needs are our priority.  As part of our continuing mission to provide you with exceptional heart care, we have created designated Provider Care Teams.  These Care Teams include your primary Cardiologist (physician) and Advanced Practice Providers (APPs -  Physician Assistants and Nurse Practitioners) who all work together to provide you with the care you need, when you need it.  We recommend signing up for the patient portal called "MyChart".  Sign up information is provided on this After Visit Summary.  MyChart is used to connect with patients for Virtual Visits (Telemedicine).  Patients are able  to view lab/test results, encounter notes, upcoming appointments, etc.  Non-urgent messages can be sent to your provider as well.   To learn more about what you can do with MyChart, go to [email or web address].    Your next appointment:   3 month(s)  The format for your next appointment:   In Person  Provider:   Dr. Bjorn Pippin

## 2022-02-16 ENCOUNTER — Telehealth: Payer: Self-pay | Admitting: *Deleted

## 2022-02-16 NOTE — Telephone Encounter (Signed)
Notified Stacy Green ok to activate itamar device.  

## 2022-02-20 DIAGNOSIS — D509 Iron deficiency anemia, unspecified: Secondary | ICD-10-CM | POA: Diagnosis not present

## 2022-02-20 DIAGNOSIS — Z09 Encounter for follow-up examination after completed treatment for conditions other than malignant neoplasm: Secondary | ICD-10-CM | POA: Diagnosis not present

## 2022-02-20 DIAGNOSIS — E559 Vitamin D deficiency, unspecified: Secondary | ICD-10-CM | POA: Diagnosis not present

## 2022-02-20 DIAGNOSIS — I5021 Acute systolic (congestive) heart failure: Secondary | ICD-10-CM | POA: Diagnosis not present

## 2022-02-20 DIAGNOSIS — R600 Localized edema: Secondary | ICD-10-CM | POA: Diagnosis not present

## 2022-02-20 DIAGNOSIS — M8000XA Age-related osteoporosis with current pathological fracture, unspecified site, initial encounter for fracture: Secondary | ICD-10-CM | POA: Diagnosis not present

## 2022-02-20 DIAGNOSIS — I10 Essential (primary) hypertension: Secondary | ICD-10-CM | POA: Diagnosis not present

## 2022-02-20 DIAGNOSIS — S32040A Wedge compression fracture of fourth lumbar vertebra, initial encounter for closed fracture: Secondary | ICD-10-CM | POA: Diagnosis not present

## 2022-02-20 DIAGNOSIS — I4819 Other persistent atrial fibrillation: Secondary | ICD-10-CM | POA: Diagnosis not present

## 2022-02-21 ENCOUNTER — Encounter (HOSPITAL_COMMUNITY): Payer: Self-pay | Admitting: Nurse Practitioner

## 2022-02-21 ENCOUNTER — Ambulatory Visit (HOSPITAL_COMMUNITY)
Admission: RE | Admit: 2022-02-21 | Discharge: 2022-02-21 | Disposition: A | Discharge status: Home / Self Care | Payer: Medicare HMO | Source: Ambulatory Visit | Attending: Nurse Practitioner | Admitting: Nurse Practitioner

## 2022-02-21 VITALS — BP 208/76 | HR 56 | Ht 63.0 in | Wt 164.0 lb

## 2022-02-21 DIAGNOSIS — D6869 Other thrombophilia: Secondary | ICD-10-CM | POA: Diagnosis not present

## 2022-02-21 DIAGNOSIS — Z7901 Long term (current) use of anticoagulants: Secondary | ICD-10-CM | POA: Diagnosis not present

## 2022-02-21 DIAGNOSIS — I4891 Unspecified atrial fibrillation: Secondary | ICD-10-CM | POA: Insufficient documentation

## 2022-02-21 DIAGNOSIS — I10 Essential (primary) hypertension: Secondary | ICD-10-CM | POA: Insufficient documentation

## 2022-02-21 DIAGNOSIS — Z79899 Other long term (current) drug therapy: Secondary | ICD-10-CM | POA: Insufficient documentation

## 2022-02-21 DIAGNOSIS — I4819 Other persistent atrial fibrillation: Secondary | ICD-10-CM | POA: Diagnosis not present

## 2022-02-21 LAB — CBC
HCT: 37.6 % (ref 36.0–46.0)
Hemoglobin: 12 g/dL (ref 12.0–15.0)
MCH: 30.1 pg (ref 26.0–34.0)
MCHC: 31.9 g/dL (ref 30.0–36.0)
MCV: 94.2 fL (ref 80.0–100.0)
Platelets: 217 10*3/uL (ref 150–400)
RBC: 3.99 MIL/uL (ref 3.87–5.11)
RDW: 12.9 % (ref 11.5–15.5)
WBC: 7.4 10*3/uL (ref 4.0–10.5)
nRBC: 0 % (ref 0.0–0.2)

## 2022-02-21 LAB — BASIC METABOLIC PANEL
Anion gap: 9 (ref 5–15)
BUN: 10 mg/dL (ref 8–23)
CO2: 30 mmol/L (ref 22–32)
Calcium: 9.8 mg/dL (ref 8.9–10.3)
Chloride: 102 mmol/L (ref 98–111)
Creatinine, Ser: 0.86 mg/dL (ref 0.44–1.00)
GFR, Estimated: >60 mL/min (ref >60)
Glucose, Bld: 97 mg/dL (ref 70–99)
Potassium: 5.3 mmol/L — ABNORMAL HIGH (ref 3.5–5.1)
Sodium: 141 mmol/L (ref 135–145)

## 2022-02-21 NOTE — Progress Notes (Signed)
Primary Care Physician: [name], MD Referring Physician: Dr. March Rummage is a 81 y.o. female with a h/o HTN, new dx of atrial fibrillation being admitted 02/06/22 with RVR and undergoing successful TEE/DCCV on 02/09/22. She was placed on eliquis 25 mg bid for a CHA2DS2VASc  score of  5. Echo showed EF of 45-50%.   She was seen by Dr. Gardiner Rhyme on 02/15/22, and was c/o of shortness of breath and fatigue. Her metoprolol was cut in half. She was scheduled to have f/u here. She remains in SR with reduction of BB with  her shortness of breath and fatigue being improved. Her BP was elevated on presentation at 208/76 but on recheck some 15 mins later it was 148/68. She saw her PCP yesterday and she talked like after blood results are known the provider would discuss a BP agent as her BP was elevated there as well. She works part time at [employer]. She has not had any awarenss of afib since the cardioversion. She is tolerating eliquis well, no issues with bleeding.   Today, she denies symptoms of palpitations, chest pain, shortness of breath, orthopnea, PND, lower extremity edema, dizziness, presyncope, syncope, or neurologic sequela. The patient is tolerating medications without difficulties and is otherwise without complaint today.   Past Medical History:  Diagnosis Date   Atrial fibrillation ([place])    GERD (gastroesophageal reflux disease)    Hypertension    Past Surgical History:  Procedure Laterality Date   BUBBLE STUDY  02/09/2022   Procedure: BUBBLE STUDY;  Surgeon: Elouise Munroe, MD;  Location: [place];  Service: Cardiovascular;;   CARDIOVERSION N/A 02/09/2022   Procedure: CARDIOVERSION;  Surgeon: Elouise Munroe, MD;  Location: [place];  Service: Cardiovascular;  Laterality: N/A;   IR KYPHO LUMBAR INC FX REDUCE BONE BX UNI/BIL CANNULATION INC/IMAGING  09/21/2021   TEE WITHOUT CARDIOVERSION N/A 02/09/2022   Procedure: TRANSESOPHAGEAL ECHOCARDIOGRAM (TEE);   Surgeon: Elouise Munroe, MD;  Location: [facility] ENDOSCOPY;  Service: Cardiovascular;  Laterality: N/A;    Current Outpatient Medications  Medication Sig Dispense Refill   acetaminophen (TYLENOL) 325 MG tablet Take 2 tablets (650 mg total) by mouth every 6 (six) hours as needed. 30 tablet 0   apixaban (ELIQUIS) 5 MG TABS tablet Take 1 tablet (5 mg total) by mouth 2 (two) times daily. 60 tablet 1   Calcium Citrate-Vitamin D (CITRACAL + D PO) Take 1 tablet by mouth every morning.     Cholecalciferol (VITAMIN D-3) 5000 UNIT/ML LIQD Take 1 capsule by mouth daily.     metoprolol succinate (TOPROL-XL) 25 MG 24 hr tablet Take 1 tablet (25 mg total) by mouth daily. Take with or immediately following a meal. 90 tablet 3   No current facility-administered medications for this encounter.    No Known Allergies  Social History   Socioeconomic History   Marital status: Widowed    Spouse name: Not on file   Number of children: Not on file   Years of education: Not on file   Highest education level: Not on file  Occupational History   Not on file  Tobacco Use   Smoking status: Former    Packs/day: 1.00    Years: 25.00    Total pack years: 25.00    Types: Cigarettes    Quit date: 12    Years since quitting: 43.8   Smokeless tobacco: Never  Substance and Sexual Activity   Alcohol use: No   Drug  use: No   Sexual activity: Yes    Birth control/protection: None  Other Topics Concern   Not on file  Social History Narrative   Not on file   Social Determinants of Health   Financial Resource Strain: Not on file  Food Insecurity: Not on file  Transportation Needs: Not on file  Physical Activity: Not on file  Stress: Not on file  Social Connections: Not on file  Intimate Partner Violence: Not on file    Family History  Problem Relation Age of Onset   Atrial fibrillation Neg Hx     ROS- All systems are reviewed and negative except as per the HPI above  Physical Exam: Vitals:    02/21/22 0935  BP: (!) 208/76  Pulse: (!) 56  Weight: 74.4 kg  Height: 5\' 3"  (1.6 m)   Wt Readings from Last 3 Encounters:  02/21/22 74.4 kg  02/15/22 76.7 kg  02/06/22 75.3 kg    Labs: Lab Results  Component Value Date   NA 136 02/10/2022   K 4.7 02/10/2022   CL 103 02/10/2022   CO2 27 02/10/2022   GLUCOSE 109 (H) 02/10/2022   BUN 24 (H) 02/10/2022   CREATININE 1.26 (H) 02/10/2022   CALCIUM 9.3 02/10/2022   PHOS 4.0 02/10/2022   MG 1.8 02/10/2022   Lab Results  Component Value Date   INR 1.2 02/08/2022   No results found for: "CHOL", "HDL", "LDLCALC", "TRIG"   GEN- The patient is well appearing, alert and oriented x 3 today.   Head- normocephalic, atraumatic Eyes-  Sclera clear, conjunctiva pink Ears- hearing intact Oropharynx- clear Neck- supple, no JVP Lymph- no cervical lymphadenopathy Lungs- Clear to ausculation bilaterally, normal work of breathing Heart- Regular rate and rhythm, no murmurs, rubs or gallops, PMI not laterally displaced GI- soft, NT, ND, + BS Extremities- no clubbing, cyanosis, or 1+ edema, rt greater than left  MS- no significant deformity or atrophy Skin- no rash or lesion Psych- euthymic mood, full affect Neuro- strength and sensation are intact  EKG-Vent. rate 56 BPM PR interval 142 ms QRS duration 84 ms QT/QTcB 442/426 ms P-R-T axes 58 56 53 Sinus bradycardia Otherwise normal ECG When compared with ECG of 09-Feb-2022 13:32, PREVIOUS ECG IS PRESENT    Assessment and Plan:  1. Afib ( new onset)  Cardioverted in the hospital 02/10/22 and is staying in SR Dose of metoprolol was recently reduced to 1/2 tab bid and her symptoms of fatigue and dyspnea have improved and is staying in SR  Continue metoprolol 25 mg bid  Triggers discussed, not drinking alcohol, no heavy caffeine intake Is pending a home sleep study   2. CHA2DS2VASc  of 5 Continue eliquis 5 mg bid  Bleeding precautions discussed  Cbc/bmet today   3, HTN Better  on recheck at 148/68 Avoid feet  elevate feet when sitting   F/u with Dr. Gardiner Rhyme as scheduled Afib clinic as needed    Butch Penny C. Abygale Karpf, [facility] [address] [place], [place] [address] [phone]

## 2022-02-26 DIAGNOSIS — M810 Age-related osteoporosis without current pathological fracture: Secondary | ICD-10-CM | POA: Diagnosis not present

## 2022-03-13 DIAGNOSIS — I10 Essential (primary) hypertension: Secondary | ICD-10-CM | POA: Diagnosis not present

## 2022-03-13 DIAGNOSIS — I4819 Other persistent atrial fibrillation: Secondary | ICD-10-CM | POA: Diagnosis not present

## 2022-03-13 DIAGNOSIS — D509 Iron deficiency anemia, unspecified: Secondary | ICD-10-CM | POA: Diagnosis not present

## 2022-03-13 DIAGNOSIS — Z23 Encounter for immunization: Secondary | ICD-10-CM | POA: Diagnosis not present

## 2022-03-13 DIAGNOSIS — M8000XA Age-related osteoporosis with current pathological fracture, unspecified site, initial encounter for fracture: Secondary | ICD-10-CM | POA: Diagnosis not present

## 2022-05-04 DIAGNOSIS — D509 Iron deficiency anemia, unspecified: Secondary | ICD-10-CM | POA: Diagnosis not present

## 2022-05-04 DIAGNOSIS — I10 Essential (primary) hypertension: Secondary | ICD-10-CM | POA: Diagnosis not present

## 2022-05-07 DIAGNOSIS — D509 Iron deficiency anemia, unspecified: Secondary | ICD-10-CM | POA: Diagnosis not present

## 2022-05-07 DIAGNOSIS — I10 Essential (primary) hypertension: Secondary | ICD-10-CM | POA: Diagnosis not present

## 2022-05-07 DIAGNOSIS — I4819 Other persistent atrial fibrillation: Secondary | ICD-10-CM | POA: Diagnosis not present

## 2022-05-14 ENCOUNTER — Ambulatory Visit: Payer: Medicare HMO | Attending: Cardiology | Admitting: Cardiology

## 2022-05-14 ENCOUNTER — Encounter: Payer: Self-pay | Admitting: Cardiology

## 2022-05-14 VITALS — BP 142/86 | HR 60 | Ht 64.0 in | Wt 165.2 lb

## 2022-05-14 DIAGNOSIS — I4819 Other persistent atrial fibrillation: Secondary | ICD-10-CM | POA: Diagnosis not present

## 2022-05-14 DIAGNOSIS — I10 Essential (primary) hypertension: Secondary | ICD-10-CM | POA: Diagnosis not present

## 2022-05-14 DIAGNOSIS — R600 Localized edema: Secondary | ICD-10-CM

## 2022-05-14 DIAGNOSIS — I5021 Acute systolic (congestive) heart failure: Secondary | ICD-10-CM | POA: Diagnosis not present

## 2022-05-14 NOTE — Progress Notes (Signed)
Cardiology Office Note:    Date:  05/14/2022   ID:  Tenley, Winward [birth year], MRN [id number]  PCP:  [name], MD  Cardiologist:  None  Electrophysiologist:  None   Referring MD: [name], MD   No chief complaint on file.   History of Present Illness:    Bianca Wolf is a 82 y.o. female with a hx of persistent atrial fibrillation, hypertension who presents for follow-up.  She was admitted 02/06/2022 with palpitations, found to have new onset atrial fibrillation with RVR.  Echocardiogram showed EF 45 to 93%, mild RV systolic dysfunction.  She was started on Eliquis and metoprolol.  Underwent successful TEE/DCCV on 02/09/2022.  Since last clinic visit, she reports she is doing okay.  Denies any chest pain but does report having some dyspnea on exertion.  Denies any lightheadedness or syncope.  No palpitations.  Does report has had some lower extremity edema.  She is taking Eliquis, denies any bleeding issues.   Past Medical History:  Diagnosis Date   Atrial fibrillation ([place])    GERD (gastroesophageal reflux disease)    Hypertension     Past Surgical History:  Procedure Laterality Date   BUBBLE STUDY  02/09/2022   Procedure: BUBBLE STUDY;  Surgeon: Elouise Munroe, MD;  Location: [place];  Service: Cardiovascular;;   CARDIOVERSION N/A 02/09/2022   Procedure: CARDIOVERSION;  Surgeon: Elouise Munroe, MD;  Location: [place];  Service: Cardiovascular;  Laterality: N/A;   IR KYPHO LUMBAR INC FX REDUCE BONE BX UNI/BIL CANNULATION INC/IMAGING  09/21/2021   TEE WITHOUT CARDIOVERSION N/A 02/09/2022   Procedure: TRANSESOPHAGEAL ECHOCARDIOGRAM (TEE);  Surgeon: Elouise Munroe, MD;  Location: [facility] ENDOSCOPY;  Service: Cardiovascular;  Laterality: N/A;    Current Medications: Current Meds  Medication Sig   acetaminophen (TYLENOL) 325 MG tablet Take 2 tablets (650 mg total) by mouth every 6 (six) hours as needed.   benazepril (LOTENSIN) 20 MG tablet Take 20  mg by mouth daily.   Calcium Citrate-Vitamin D (CITRACAL + D PO) Take 1 tablet by mouth every morning.   Cholecalciferol (VITAMIN D-3) 5000 UNIT/ML LIQD Take 1 capsule by mouth daily.   metoprolol succinate (TOPROL-XL) 25 MG 24 hr tablet Take 1 tablet (25 mg total) by mouth daily. Take with or immediately following a meal.   omeprazole (PRILOSEC) 20 MG capsule Take 20 mg by mouth daily.     Allergies:   Patient has no known allergies.   Social History   Socioeconomic History   Marital status: Widowed    Spouse name: Not on file   Number of children: Not on file   Years of education: Not on file   Highest education level: Not on file  Occupational History   Not on file  Tobacco Use   Smoking status: Former    Packs/day: 1.00    Years: 25.00    Total pack years: 25.00    Types: Cigarettes    Quit date: 11    Years since quitting: 44.0   Smokeless tobacco: Never  Substance and Sexual Activity   Alcohol use: No   Drug use: No   Sexual activity: Yes    Birth control/protection: None  Other Topics Concern   Not on file  Social History Narrative   Not on file   Social Determinants of Health   Financial Resource Strain: Not on file  Food Insecurity: Not on file  Transportation Needs: Not on file  Physical Activity: Not on file  Stress: Not on file  Social Connections: Not on file     Family History: The patient's family history is negative for Atrial fibrillation.  ROS:   Please see the history of present illness.     All other systems reviewed and are negative.  EKGs/Labs/Other Studies Reviewed:    The following studies were reviewed today:   EKG:   05/14/2022: Sinus rhythm with PACs, rate 60 02/15/22: Sinus bradycardia, rate 54, no ST abnormalities  Recent Labs: 02/06/2022: TSH 1.223 02/10/2022: ALT 25; Magnesium 1.8 02/21/2022: BUN 10; Creatinine, Ser 0.86; Hemoglobin 12.0; Platelets 217; Potassium 5.3; Sodium 141  Recent Lipid Panel No results found  for: "CHOL", "TRIG", "HDL", "CHOLHDL", "VLDL", "LDLCALC", "LDLDIRECT"  Physical Exam:    VS:  BP (!) 142/86   Pulse 60   Ht 5\' 4"  (1.626 m)   Wt 165 lb 3.2 oz (74.9 kg)   SpO2 97%   BMI 28.36 kg/m     Wt Readings from Last 3 Encounters:  05/14/22 165 lb 3.2 oz (74.9 kg)  02/21/22 164 lb (74.4 kg)  02/15/22 169 lb (76.7 kg)     GEN:  in no acute distress HEENT: Normal NECK: No JVD; No carotid bruits CARDIAC: bradycardic, regular, no murmurs, rubs, gallops RESPIRATORY:  Clear to auscultation without rales, wheezing or rhonchi  ABDOMEN: Soft, non-tender, non-distended MUSCULOSKELETAL:  1+ edema; No deformity  SKIN: Warm and dry NEUROLOGIC:  Alert and oriented x 3 PSYCHIATRIC:  Normal affect   ASSESSMENT:    1. Persistent atrial fibrillation ([place])   2. Acute systolic heart failure ([place])   3. Essential hypertension   4. Lower extremity edema     PLAN:    Persistent atrial fibrillation: She was admitted 02/06/2022 with palpitations, found to have new onset atrial fibrillation with RVR.  Echocardiogram showed EF 45 to 41%, mild RV systolic dysfunction.  She was started on Eliquis and metoprolol.  Underwent successful TEE/DCCV on 02/09/2022.  CHA2DS2-VASc score 5 (CHF, hypertension, age x2, female) -Continue Eliquis 5 mg twice daily -Continue Toprol-XL 25 mg daily.  She felt significant fatigue on 50 mg daily, dose reduced to 25 mg daily -Sleep study  Acute systolic heart failure: EF 45 to 50% on echo 02/07/2022.  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  Will monitor for improvement with maintaining sinus rhythm -Continue Toprol-XL 25 mg daily -Continue benazepril 20 mg daily -Check echo  Lower extremity edema: Mild bilateral lower extremity edema.  Could be related to systolic dysfunction as above, though lungs are clear and no JVD on exam.  Check BNP, CMET  Hypertension: Continue Toprol-XL 25 mg daily and benazepril 20 mg daily  Daytime somnolence: Recommended  Itamar sleep study.  STOPBANG 4   RTC in 3 months  Medication Adjustments/Labs and Tests Ordered: Current medicines are reviewed at length with the patient today.  Concerns regarding medicines are outlined above.  Orders Placed This Encounter  Procedures   Comprehensive metabolic panel   CBC   Brain natriuretic peptide   EKG 12-Lead   ECHOCARDIOGRAM COMPLETE   No orders of the defined types were placed in this encounter.   Patient Instructions  Medication Instructions:  Your physician recommends that you continue on your current medications as directed. Please refer to the Current Medication list given to you today.  *If you need a refill on your cardiac medications before your next appointment, please call your pharmacy*   Lab Work: CMET, CBC, BNP today  If you have labs (blood work) drawn today and  your tests are completely normal, you will receive your results only by: MyChart Message (if you have MyChart) OR A paper copy in the mail If you have any lab test that is abnormal or we need to change your treatment, we will call you to review the results.   Testing/Procedures: Your physician has requested that you have an echocardiogram. Echocardiography is a painless test that uses sound waves to create images of your heart. It provides your doctor with information about the size and shape of your heart and how well your heart's chambers and valves are working. This procedure takes approximately one hour. There are no restrictions for this procedure. Please do NOT wear cologne, perfume, aftershave, or lotions (deodorant is allowed). Please arrive 15 minutes prior to your appointment time.   Follow-Up: At [facility], you and your health needs are our priority.  As part of our continuing mission to provide you with exceptional heart care, we have created designated Provider Care Teams.  These Care Teams include your primary Cardiologist (physician) and Advanced  Practice Providers (APPs -  Physician Assistants and Nurse Practitioners) who all work together to provide you with the care you need, when you need it.  We recommend signing up for the patient portal called "MyChart".  Sign up information is provided on this After Visit Summary.  MyChart is used to connect with patients for Virtual Visits (Telemedicine).  Patients are able to view lab/test results, encounter notes, upcoming appointments, etc.  Non-urgent messages can be sent to your provider as well.   To learn more about what you can do with MyChart, go to [email or web address].    Your next appointment:   3 month(s)  Provider:   Dr. Bjorn Pippin    Signed, Little Ishikawa, MD  05/14/2022 11:51 AM    [facility]

## 2022-05-14 NOTE — Patient Instructions (Signed)
Medication Instructions:  Your physician recommends that you continue on your current medications as directed. Please refer to the Current Medication list given to you today.  *If you need a refill on your cardiac medications before your next appointment, please call your pharmacy*   Lab Work: CMET, CBC, BNP today  If you have labs (blood work) drawn today and your tests are completely normal, you will receive your results only by: [place] (if you have MyChart) OR A paper copy in the mail If you have any lab test that is abnormal or we need to change your treatment, we will call you to review the results.   Testing/Procedures: Your physician has requested that you have an echocardiogram. Echocardiography is a painless test that uses sound waves to create images of your heart. It provides your doctor with information about the size and shape of your heart and how well your heart's chambers and valves are working. This procedure takes approximately one hour. There are no restrictions for this procedure. Please do NOT wear cologne, perfume, aftershave, or lotions (deodorant is allowed). Please arrive 15 minutes prior to your appointment time.   Follow-Up: At [facility], you and your health needs are our priority.  As part of our continuing mission to provide you with exceptional heart care, we have created designated Provider Care Teams.  These Care Teams include your primary Cardiologist (physician) and Advanced Practice Providers (APPs -  Physician Assistants and Nurse Practitioners) who all work together to provide you with the care you need, when you need it.  We recommend signing up for the patient portal called "MyChart".  Sign up information is provided on this After Visit Summary.  MyChart is used to connect with patients for Virtual Visits (Telemedicine).  Patients are able to view lab/test results, encounter notes, upcoming appointments, etc.  Non-urgent messages can  be sent to your provider as well.   To learn more about what you can do with MyChart, go to [email or web address].    Your next appointment:   3 month(s)  Provider:   Dr. Gardiner Rhyme

## 2022-05-15 LAB — COMPREHENSIVE METABOLIC PANEL
ALT: 5 IU/L (ref 0–32)
AST: 12 IU/L (ref 0–40)
Albumin/Globulin Ratio: 1.5 (ref 1.2–2.2)
Albumin: 4.2 g/dL (ref 3.7–4.7)
Alkaline Phosphatase: 69 IU/L (ref 44–121)
BUN/Creatinine Ratio: 15 (ref 12–28)
BUN: 12 mg/dL (ref 8–27)
Bilirubin Total: 0.6 mg/dL (ref 0.0–1.2)
CO2: 26 mmol/L (ref 20–29)
Calcium: 9.5 mg/dL (ref 8.7–10.3)
Chloride: 100 mmol/L (ref 96–106)
Creatinine, Ser: 0.8 mg/dL (ref 0.57–1.00)
Globulin, Total: 2.8 g/dL (ref 1.5–4.5)
Glucose: 84 mg/dL (ref 70–99)
Potassium: 4.4 mmol/L (ref 3.5–5.2)
Sodium: 142 mmol/L (ref 134–144)
Total Protein: 7 g/dL (ref 6.0–8.5)
eGFR: 74 mL/min/{1.73_m2} (ref >59)

## 2022-05-15 LAB — CBC
Hematocrit: 38.1 % (ref 34.0–46.6)
Hemoglobin: 12 g/dL (ref 11.1–15.9)
MCH: 28.5 pg (ref 26.6–33.0)
MCHC: 31.5 g/dL (ref 31.5–35.7)
MCV: 91 fL (ref 79–97)
Platelets: 224 10*3/uL (ref 150–450)
RBC: 4.21 x10E6/uL (ref 3.77–5.28)
RDW: 14 % (ref 11.7–15.4)
WBC: 6.1 10*3/uL (ref 3.4–10.8)

## 2022-05-15 LAB — BRAIN NATRIURETIC PEPTIDE: BNP: 219 pg/mL — ABNORMAL HIGH (ref 0.0–100.0)

## 2022-05-18 ENCOUNTER — Other Ambulatory Visit: Payer: Self-pay | Admitting: *Deleted

## 2022-05-18 MED ORDER — FUROSEMIDE 20 MG PO TABS: 20.0000 mg | ORAL_TABLET | Freq: Every day | ORAL | 3 refills | Status: DC | PRN

## 2022-05-30 ENCOUNTER — Telehealth: Payer: Self-pay

## 2022-05-30 NOTE — Telephone Encounter (Signed)
Called and made the patient aware that she may proceed with the [facility] Home Sleep Study. PIN # provided to the patient. Patient made aware that she will be contacted after the test has been read with the results and any recommendations. Patient verbalized understanding and thanked me for the call.   

## 2022-05-31 ENCOUNTER — Encounter (HOSPITAL_COMMUNITY): Payer: Self-pay | Admitting: *Deleted

## 2022-06-05 ENCOUNTER — Ambulatory Visit (HOSPITAL_COMMUNITY): Payer: Medicare HMO | Attending: Cardiology

## 2022-06-05 DIAGNOSIS — I5021 Acute systolic (congestive) heart failure: Secondary | ICD-10-CM | POA: Insufficient documentation

## 2022-06-05 LAB — ECHOCARDIOGRAM COMPLETE
Area-P 1/2: 2.84 cm2
S' Lateral: 3.1 cm

## 2022-06-12 ENCOUNTER — Telehealth: Payer: Self-pay | Admitting: Cardiology

## 2022-06-12 NOTE — Telephone Encounter (Signed)
Pt is calling back in regards to echo results. Requesting return call.

## 2022-06-12 NOTE — Telephone Encounter (Signed)
Spoke with daughter Harmon Pier and gave her echo results per Dr. Gardiner Rhyme. Harmon Pier stated patient has decreased energy and doesn't feel good. This is not new. Any orders?

## 2022-06-13 NOTE — Telephone Encounter (Signed)
Has she been monitoring daily weights?  Has she had to take the prn lasix?

## 2022-06-20 NOTE — Telephone Encounter (Signed)
Left message to call back  

## 2022-06-21 DIAGNOSIS — I4819 Other persistent atrial fibrillation: Secondary | ICD-10-CM | POA: Diagnosis not present

## 2022-06-21 DIAGNOSIS — D509 Iron deficiency anemia, unspecified: Secondary | ICD-10-CM | POA: Diagnosis not present

## 2022-06-21 DIAGNOSIS — I10 Essential (primary) hypertension: Secondary | ICD-10-CM | POA: Diagnosis not present

## 2022-08-23 ENCOUNTER — Encounter: Payer: Self-pay | Admitting: Cardiology

## 2022-08-23 ENCOUNTER — Ambulatory Visit: Payer: Medicare HMO | Attending: Cardiology | Admitting: Cardiology

## 2022-08-23 VITALS — BP 120/74 | HR 101 | Ht 64.0 in | Wt 169.2 lb

## 2022-08-23 DIAGNOSIS — I10 Essential (primary) hypertension: Secondary | ICD-10-CM | POA: Diagnosis not present

## 2022-08-23 DIAGNOSIS — I5021 Acute systolic (congestive) heart failure: Secondary | ICD-10-CM

## 2022-08-23 DIAGNOSIS — I4819 Other persistent atrial fibrillation: Secondary | ICD-10-CM

## 2022-08-23 NOTE — Patient Instructions (Addendum)
Medication Instructions:  Your physician recommends that you continue on your current medications as directed. Please refer to the Current Medication list given to you today.  *If you need a refill on your cardiac medications before your next appointment, please call your pharmacy*  Lab Work: CMET, CBC today  If you have labs (blood work) drawn today and your tests are completely normal, you will receive your results only by: MyChart Message (if you have MyChart) OR A paper copy in the mail If you have any lab test that is abnormal or we need to change your treatment, we will call you to review the results.  Follow-Up: At [facility], you and your health needs are our priority.  As part of our continuing mission to provide you with exceptional heart care, we have created designated Provider Care Teams.  These Care Teams include your primary Cardiologist (physician) and Advanced Practice Providers (APPs -  Physician Assistants and Nurse Practitioners) who all work together to provide you with the care you need, when you need it.  We recommend signing up for the patient portal called "MyChart".  Sign up information is provided on this After Visit Summary.  MyChart is used to connect with patients for Virtual Visits (Telemedicine).  Patients are able to view lab/test results, encounter notes, upcoming appointments, etc.  Non-urgent messages can be sent to your provider as well.   To learn more about what you can do with MyChart, go to [email or web address].    Your next appointment:   Afib Clinic in 2 weeks 2 months with Dr. Bjorn Pippin

## 2022-08-23 NOTE — Progress Notes (Signed)
Cardiology Office Note:    Date:  08/23/2022   ID:  Bianca Wolf 24-Sep-1940, MRN [id number]  PCP:  Bianca Brunette, MD  Cardiologist:  None  Electrophysiologist:  None   Referring MD: Bianca Brunette, MD   Chief Complaint  Patient presents with   Atrial Fibrillation    History of Present Illness:    Bianca Wolf is a 82 y.o. female with a hx of persistent atrial fibrillation, hypertension who presents for follow-up.  She was admitted 02/06/2022 with palpitations, found to have new onset atrial fibrillation with RVR.  Echocardiogram showed EF 45 to 50%, mild RV systolic dysfunction.  She was started on Eliquis and metoprolol.  Underwent successful TEE/DCCV on 02/09/2022.  Echocardiogram 06/05/2022 showed EF 65 to 70%, moderate LVH, grade 2 diastolic dysfunction, normal RV function, moderately elevated pulmonary pressures, severe left atrial enlargement, mild right atrial enlargement.  Since last clinic visit, she reports she has been doing okay.  Reports dyspnea and some shortness of breath but denies any chest pain.  Denies any lightheadedness or syncope.  Reports some lower extremity edema.  Denies any palpitations.  Has only been taking her Eliquis once daily.  Denies any bleeding issues.   Past Medical History:  Diagnosis Date   Atrial fibrillation ([facility])    GERD (gastroesophageal reflux disease)    Hypertension     Past Surgical History:  Procedure Laterality Date   BUBBLE STUDY  02/09/2022   Procedure: BUBBLE STUDY;  Surgeon: Parke Poisson, MD;  Location: [facility] ENDOSCOPY;  Service: Cardiovascular;;   CARDIOVERSION N/A 02/09/2022   Procedure: CARDIOVERSION;  Surgeon: Parke Poisson, MD;  Location: [facility] ENDOSCOPY;  Service: Cardiovascular;  Laterality: N/A;   IR KYPHO LUMBAR INC FX REDUCE BONE BX UNI/BIL CANNULATION INC/IMAGING  09/21/2021   TEE WITHOUT CARDIOVERSION N/A 02/09/2022   Procedure: TRANSESOPHAGEAL ECHOCARDIOGRAM (TEE);  Surgeon: Parke Poisson, MD;   Location: [facility] ENDOSCOPY;  Service: Cardiovascular;  Laterality: N/A;    Current Medications: Current Meds  Medication Sig   acetaminophen (TYLENOL) 325 MG tablet Take 2 tablets (650 mg total) by mouth every 6 (six) hours as needed.   apixaban (ELIQUIS) 5 MG TABS tablet Take 1 tablet (5 mg total) by mouth 2 (two) times daily.   benazepril (LOTENSIN) 20 MG tablet Take 20 mg by mouth daily.   Calcium Citrate-Vitamin D (CITRACAL + D PO) Take 1 tablet by mouth every morning.   Cholecalciferol (VITAMIN D-3) 5000 UNIT/ML LIQD Take 1 capsule by mouth daily.   cholecalciferol (VITAMIN D3) 25 MCG (1000 UNIT) tablet Take 1,000 Units by mouth daily.   furosemide (LASIX) 20 MG tablet Take 1 tablet (20 mg total) by mouth daily as needed (for weight increase of 3 lbs overnight or 5 lbs in 1 week).   Iron, Ferrous Sulfate, 325 (65 Fe) MG TABS Take by mouth once a week. Patient takes 1 -2   metoprolol succinate (TOPROL-XL) 25 MG 24 hr tablet Take 1 tablet (25 mg total) by mouth daily. Take with or immediately following a meal.   omeprazole (PRILOSEC) 20 MG capsule Take 20 mg by mouth daily.     Allergies:   Patient has no known allergies.   Social History   Socioeconomic History   Marital status: Widowed    Spouse name: Not on file   Number of children: Not on file   Years of education: Not on file   Highest education level: Not on file  Occupational History   Not  on file  Tobacco Use   Smoking status: Former    Packs/day: 1.00    Years: 25.00    Additional pack years: 0.00    Total pack years: 25.00    Types: Cigarettes    Quit date: 1980    Years since quitting: 44.3   Smokeless tobacco: Never  Substance and Sexual Activity   Alcohol use: No   Drug use: No   Sexual activity: Yes    Birth control/protection: None  Other Topics Concern   Not on file  Social History Narrative   Not on file   Social Determinants of Health   Financial Resource Strain: Not on file  Food Insecurity: Not  on file  Transportation Needs: Not on file  Physical Activity: Not on file  Stress: Not on file  Social Connections: Not on file     Family History: The patient's family history is negative for Atrial fibrillation.  ROS:   Please see the history of present illness.     All other systems reviewed and are negative.  EKGs/Labs/Other Studies Reviewed:    The following studies were reviewed today:   EKG:   08/23/22: Afib, rate 101 05/14/2022: Sinus rhythm with PACs, rate 60 02/15/22: Sinus bradycardia, rate 54, no ST abnormalities  Recent Labs: 02/06/2022: TSH 1.223 02/10/2022: Magnesium 1.8 05/14/2022: ALT 5; BNP 219.0; BUN 12; Creatinine, Ser 0.80; Hemoglobin 12.0; Platelets 224; Potassium 4.4; Sodium 142  Recent Lipid Panel No results found for: "CHOL", "TRIG", "HDL", "CHOLHDL", "VLDL", "LDLCALC", "LDLDIRECT"  Physical Exam:    VS:  BP 120/74   Pulse (!) 101   Ht 5\' 4"  (1.626 m)   Wt 169 lb 3.2 oz (76.7 kg)   SpO2 93%   BMI 29.04 kg/m     Wt Readings from Last 3 Encounters:  08/23/22 169 lb 3.2 oz (76.7 kg)  05/14/22 165 lb 3.2 oz (74.9 kg)  02/21/22 164 lb (74.4 kg)     GEN:  in no acute distress HEENT: Normal NECK: No JVD; No carotid bruits CARDIAC: bradycardic, regular, no murmurs, rubs, gallops RESPIRATORY:  Clear to auscultation without rales, wheezing or rhonchi  ABDOMEN: Soft, non-tender, non-distended MUSCULOSKELETAL:  1+ edema; No deformity  SKIN: Warm and dry NEUROLOGIC:  Alert and oriented x 3 PSYCHIATRIC:  Normal affect   ASSESSMENT:    1. Persistent atrial fibrillation (HCC)   2. Acute systolic heart failure (H[facility])   3. Essential hypertension     PLAN:    Persistent atrial fibrillation: She was admitted 02/06/2022 with palpitations, found to have new onset atrial fibrillation with RVR.  Echocardiogram showed EF 45 to 50%, mild RV systolic dysfunction.  She was started on Eliquis and metoprolol.  Underwent successful TEE/DCCV on 02/09/2022.   CHA2DS2-VASc score 5 (CHF, hypertension, age x2, female) -Continue Toprol-XL 25 mg daily.  She felt significant fatigue on 50 mg daily, dose reduced to 25 mg daily -She is back in A-fib in clinic today, rates appear reasonably controlled.  Given prior tachycardia induced cardiomyopathy, recommend rhythm control.  Will plan repeat cardioversion and referred to A-fib clinic to consider antiarrhythmic.  She has been only taking Eliquis once daily, will need to complete 3 weeks of anticoagulation prior to cardioversion.  Advised to take Eliquis 5 mg twice daily and will schedule in A-fib clinic in 2 weeks.  Acute systolic heart failure: EF 45 to 50% on echo 02/07/2022.  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  Echocardiogram 06/05/2022 showed EF 65 to 70%, moderate LVH, grade  2 diastolic dysfunction, normal RV function, moderately elevated pulmonary pressures, severe left atrial enlargement, mild right atrial enlargement. -Continue Toprol-XL 25 mg daily -Continue benazepril 20 mg daily  Lower extremity edema: Mild bilateral lower extremity edema.  Could be related to systolic dysfunction as above, though lungs are clear and no JVD on exam.  On Lasix 20 mg daily as needed  Hypertension: Continue Toprol-XL 25 mg daily and benazepril 20 mg daily.  Appears controlled  Daytime somnolence: Recommended Itamar sleep study but patient did not have done.  STOPBANG 4   RTC in 2 months  Medication Adjustments/Labs and Tests Ordered: Current medicines are reviewed at length with the patient today.  Concerns regarding medicines are outlined above.  Orders Placed This Encounter  Procedures   Comprehensive metabolic panel   CBC   EKG 12-Lead   No orders of the defined types were placed in this encounter.   Patient Instructions  Medication Instructions:  Your physician recommends that you continue on your current medications as directed. Please refer to the Current Medication list given to you  today.  *If you need a refill on your cardiac medications before your next appointment, please call your pharmacy*  Lab Work: CMET, CBC today  If you have labs (blood work) drawn today and your tests are completely normal, you will receive your results only by: MyChart Message (if you have MyChart) OR A paper copy in the mail If you have any lab test that is abnormal or we need to change your treatment, we will call you to review the results.  Follow-Up: At [facility], you and your health needs are our priority.  As part of our continuing mission to provide you with exceptional heart care, we have created designated Provider Care Teams.  These Care Teams include your primary Cardiologist (physician) and Advanced Practice Providers (APPs -  Physician Assistants and Nurse Practitioners) who all work together to provide you with the care you need, when you need it.  We recommend signing up for the patient portal called "MyChart".  Sign up information is provided on this After Visit Summary.  MyChart is used to connect with patients for Virtual Visits (Telemedicine).  Patients are able to view lab/test results, encounter notes, upcoming appointments, etc.  Non-urgent messages can be sent to your provider as well.   To learn more about what you can do with MyChart, go to [email or web address].    Your next appointment:   Afib Clinic in 2 weeks 2 months with Dr. Bjorn Pippin    Signed, Little Ishikawa, MD  08/23/2022 3:21 PM    [facility]

## 2022-08-24 ENCOUNTER — Telehealth: Payer: Self-pay | Admitting: *Deleted

## 2022-08-24 DIAGNOSIS — E875 Hyperkalemia: Secondary | ICD-10-CM

## 2022-08-24 LAB — COMPREHENSIVE METABOLIC PANEL
ALT: 8 IU/L (ref 0–32)
AST: 15 IU/L (ref 0–40)
Albumin/Globulin Ratio: 1.3 (ref 1.2–2.2)
Albumin: 4.1 g/dL (ref 3.7–4.7)
Alkaline Phosphatase: 74 IU/L (ref 44–121)
BUN/Creatinine Ratio: 14 (ref 12–28)
BUN: 13 mg/dL (ref 8–27)
Bilirubin Total: 0.4 mg/dL (ref 0.0–1.2)
CO2: 27 mmol/L (ref 20–29)
Calcium: 9.6 mg/dL (ref 8.7–10.3)
Chloride: 100 mmol/L (ref 96–106)
Creatinine, Ser: 0.93 mg/dL (ref 0.57–1.00)
Globulin, Total: 3.1 g/dL (ref 1.5–4.5)
Glucose: 94 mg/dL (ref 70–99)
Potassium: 5.4 mmol/L — ABNORMAL HIGH (ref 3.5–5.2)
Sodium: 144 mmol/L (ref 134–144)
Total Protein: 7.2 g/dL (ref 6.0–8.5)
eGFR: 61 mL/min/{1.73_m2} (ref >59)

## 2022-08-24 LAB — CBC
Hematocrit: 40.9 % (ref 34.0–46.6)
Hemoglobin: 13.2 g/dL (ref 11.1–15.9)
MCH: 29.1 pg (ref 26.6–33.0)
MCHC: 32.3 g/dL (ref 31.5–35.7)
MCV: 90 fL (ref 79–97)
Platelets: 221 10*3/uL (ref 150–450)
RBC: 4.53 x10E6/uL (ref 3.77–5.28)
RDW: 13.1 % (ref 11.7–15.4)
WBC: 6.9 10*3/uL (ref 3.4–10.8)

## 2022-08-24 NOTE — Telephone Encounter (Signed)
Spoke with pt, aware of the recommendations. Lab orders placed.

## 2022-08-24 NOTE — Telephone Encounter (Signed)
-----   Message from Little Ishikawa, MD sent at 08/24/2022  9:24 AM EDT ----- Labs look good except potassium is mildly elevated.  Recommend avoiding high potassium foods and repeat BMET next week, recommend having labs drawn at [facility]

## 2022-08-29 ENCOUNTER — Other Ambulatory Visit (HOSPITAL_COMMUNITY)
Admission: RE | Admit: 2022-08-29 | Discharge: 2022-08-29 | Disposition: A | Discharge status: Home / Self Care | Payer: Medicare HMO | Source: Ambulatory Visit | Attending: Cardiology | Admitting: Cardiology

## 2022-08-29 DIAGNOSIS — E876 Hypokalemia: Secondary | ICD-10-CM | POA: Insufficient documentation

## 2022-08-29 LAB — BASIC METABOLIC PANEL
Anion gap: 9 (ref 5–15)
BUN: 11 mg/dL (ref 8–23)
CO2: 30 mmol/L (ref 22–32)
Calcium: 9.2 mg/dL (ref 8.9–10.3)
Chloride: 102 mmol/L (ref 98–111)
Creatinine, Ser: 0.98 mg/dL (ref 0.44–1.00)
GFR, Estimated: 58 mL/min — ABNORMAL LOW (ref >60)
Glucose, Bld: 89 mg/dL (ref 70–99)
Potassium: 4.3 mmol/L (ref 3.5–5.1)
Sodium: 141 mmol/L (ref 135–145)

## 2022-09-05 ENCOUNTER — Telehealth: Payer: Self-pay

## 2022-09-05 NOTE — Telephone Encounter (Signed)
I CALLED THE PATIENT TO GIVE HER THE PIN#, LVM FOR HER TO RETURN MY CALL

## 2022-09-07 ENCOUNTER — Ambulatory Visit (HOSPITAL_COMMUNITY)
Admission: RE | Admit: 2022-09-07 | Discharge: 2022-09-07 | Disposition: A | Discharge status: Home / Self Care | Payer: Medicare HMO | Source: Ambulatory Visit | Attending: Internal Medicine | Admitting: Internal Medicine

## 2022-09-07 VITALS — BP 144/100 | HR 137 | Ht 64.0 in | Wt 169.0 lb

## 2022-09-07 DIAGNOSIS — Z7901 Long term (current) use of anticoagulants: Secondary | ICD-10-CM | POA: Diagnosis not present

## 2022-09-07 DIAGNOSIS — I4819 Other persistent atrial fibrillation: Secondary | ICD-10-CM | POA: Insufficient documentation

## 2022-09-07 DIAGNOSIS — I10 Essential (primary) hypertension: Secondary | ICD-10-CM | POA: Insufficient documentation

## 2022-09-07 DIAGNOSIS — D6869 Other thrombophilia: Secondary | ICD-10-CM

## 2022-09-07 DIAGNOSIS — I4892 Unspecified atrial flutter: Secondary | ICD-10-CM | POA: Diagnosis not present

## 2022-09-07 MED ORDER — METOPROLOL SUCCINATE ER 25 MG PO TB24: 50.0000 mg | ORAL_TABLET | Freq: Every day | ORAL | 3 refills | Status: DC

## 2022-09-07 NOTE — Progress Notes (Signed)
Primary Care Physician: Merri Brunette, MD Referring Physician: Dr. Lavinia Sharps is a 82 y.o. female with a h/o HTN, new dx of atrial fibrillation being admitted 02/06/22 with RVR and undergoing successful TEE/DCCV on 02/09/22. She was placed on eliquis 25 mg bid for a CHA2DS2VASc  score of  5. Echo showed EF of 45-50%.   She was seen by Dr. Bjorn Pippin on 02/15/22, and was c/o of shortness of breath and fatigue. Her metoprolol was cut in half. She was scheduled to have f/u here. She remains in SR with reduction of BB with  her shortness of breath and fatigue being improved. Her BP was elevated on presentation at 208/76 but on recheck some 15 mins later it was 148/68. She saw her PCP yesterday and she talked like after blood results are known the provider would discuss a BP agent as her BP was elevated there as well. She works part time at [employer]. She has not had any awarenss of afib since the cardioversion. She is tolerating eliquis well, no issues with bleeding.   On follow up today 09/07/22, she is currently in atrial flutter with RVR. She missed her dose of Eliquis last night. Seen by Dr. Bjorn Pippin on 08/23/22 and noted to be in Afib with RVR. She was only taking Eliquis 5 mg once daily, advised to take correct twice daily dosage, and plan for DCCV after 3 weeks of anticoagulation and discuss AAD. She is a little short of breath on exertion when in Afib. She currently has a cold and cough with it.   Today, she denies symptoms of palpitations, chest pain, shortness of breath, orthopnea, PND, lower extremity edema, dizziness, presyncope, syncope, or neurologic sequela. The patient is tolerating medications without difficulties and is otherwise without complaint today.   Past Medical History:  Diagnosis Date   Atrial fibrillation (HCC)    GERD (gastroesophageal reflux disease)    Hypertension    Past Surgical History:  Procedure Laterality Date   BUBBLE STUDY  02/09/2022   Procedure:  BUBBLE STUDY;  Surgeon: Parke Poisson, MD;  Location: [facility] ENDOSCOPY;  Service: Cardiovascular;;   CARDIOVERSION N/A 02/09/2022   Procedure: CARDIOVERSION;  Surgeon: Parke Poisson, MD;  Location: [facility] ENDOSCOPY;  Service: Cardiovascular;  Laterality: N/A;   IR KYPHO LUMBAR INC FX REDUCE BONE BX UNI/BIL CANNULATION INC/IMAGING  09/21/2021   TEE WITHOUT CARDIOVERSION N/A 02/09/2022   Procedure: TRANSESOPHAGEAL ECHOCARDIOGRAM (TEE);  Surgeon: Parke Poisson, MD;  Location: [facility] ENDOSCOPY;  Service: Cardiovascular;  Laterality: N/A;    Current Outpatient Medications  Medication Sig Dispense Refill   acetaminophen (TYLENOL) 325 MG tablet Take 2 tablets (650 mg total) by mouth every 6 (six) hours as needed. 30 tablet 0   apixaban (ELIQUIS) 5 MG TABS tablet Take 1 tablet (5 mg total) by mouth 2 (two) times daily. 60 tablet 1   benazepril (LOTENSIN) 20 MG tablet Take 20 mg by mouth daily.     Calcium Citrate-Vitamin D (CITRACAL + D PO) Take 1 tablet by mouth every morning.     Cholecalciferol (VITAMIN D-3) 5000 UNIT/ML LIQD Take 1 capsule by mouth daily.     cholecalciferol (VITAMIN D3) 25 MCG (1000 UNIT) tablet Take 1,000 Units by mouth daily.     furosemide (LASIX) 20 MG tablet Take 1 tablet (20 mg total) by mouth daily as needed (for weight increase of 3 lbs overnight or 5 lbs in 1 week). 30 tablet 3   Iron, Ferrous  Sulfate, 325 (65 Fe) MG TABS Take by mouth once a week. Patient takes 1 -2     metoprolol succinate (TOPROL-XL) 25 MG 24 hr tablet Take 1 tablet (25 mg total) by mouth daily. Take with or immediately following a meal. 90 tablet 3   omeprazole (PRILOSEC) 20 MG capsule Take 20 mg by mouth daily.     No current facility-administered medications for this visit.    No Known Allergies  Social History   Socioeconomic History   Marital status: Widowed    Spouse name: Not on file   Number of children: Not on file   Years of education: Not on file   Highest education level: Not on  file  Occupational History   Not on file  Tobacco Use   Smoking status: Former    Packs/day: 1.00    Years: 25.00    Additional pack years: 0.00    Total pack years: 25.00    Types: Cigarettes    Quit date: 48    Years since quitting: 44.4   Smokeless tobacco: Never  Substance and Sexual Activity   Alcohol use: No   Drug use: No   Sexual activity: Yes    Birth control/protection: None  Other Topics Concern   Not on file  Social History Narrative   Not on file   Social Determinants of Health   Financial Resource Strain: Not on file  Food Insecurity: Not on file  Transportation Needs: Not on file  Physical Activity: Not on file  Stress: Not on file  Social Connections: Not on file  Intimate Partner Violence: Not on file    Family History  Problem Relation Age of Onset   Atrial fibrillation Neg Hx     ROS- All systems are reviewed and negative except as per the HPI above  Physical Exam: There were no vitals filed for this visit.  Wt Readings from Last 3 Encounters:  08/23/22 76.7 kg  05/14/22 74.9 kg  02/21/22 74.4 kg    Labs: Lab Results  Component Value Date   NA 141 08/29/2022   K 4.3 08/29/2022   CL 102 08/29/2022   CO2 30 08/29/2022   GLUCOSE 89 08/29/2022   BUN 11 08/29/2022   CREATININE 0.98 08/29/2022   CALCIUM 9.2 08/29/2022   PHOS 4.0 02/10/2022   MG 1.8 02/10/2022   Lab Results  Component Value Date   INR 1.2 02/08/2022   No results found for: "CHOL", "HDL", "LDLCALC", "TRIG"  GEN- The patient is well appearing, alert and oriented x 3 today.   Head- normocephalic, atraumatic Eyes-  Sclera clear, conjunctiva pink Ears- hearing intact Lungs- Clear to ausculation bilaterally, normal work of breathing Heart- Tachycardic irregular rate and rhythm, no murmurs, rubs or gallops, PMI not laterally displaced Extremities- no clubbing, cyanosis, or edema MS- no significant deformity or atrophy Skin- no rash or lesion Psych- euthymic mood,  full affect Neuro- strength and sensation are intact   EKG- Vent. rate 137 BPM PR interval 160 ms QRS duration 140 ms QT/QTcB 358/540 ms P-R-T axes * 72 -38 Atrial flutter with Premature atrial complexes with Abberant conduction Non-specific intra-ventricular conduction block T wave abnormality, consider inferior ischemia T wave abnormality, consider anterolateral ischemia Abnormal ECG When compared with ECG of 21-Feb-2022 10:39, PREVIOUS ECG IS PRESENT  Echo 06/05/22:  1. Left ventricular ejection fraction, by estimation, is 65 to 70%. The  left ventricle has normal function. The left ventricle has no regional  wall motion abnormalities. There  is moderate concentric left ventricular  hypertrophy. Left ventricular  diastolic parameters are consistent with Grade II diastolic dysfunction  (pseudonormalization).   2. Right ventricular systolic function is normal. The right ventricular  size is normal. There is moderately elevated pulmonary artery systolic  pressure.   3. Left atrial size was severely dilated.   4. Right atrial size was mildly dilated.   5. The mitral valve is normal in structure. Trivial mitral valve  regurgitation. No evidence of mitral stenosis.   6. The aortic valve is tricuspid. Aortic valve regurgitation is not  visualized. No aortic stenosis is present.   7. The inferior vena cava is normal in size with greater than 50%  respiratory variability, suggesting right atrial pressure of 3 mmHg.     Assessment and Plan:  1. Persistent Afib Cardioverted in the hospital 02/10/22   She is in atrial flutter with RVR today.  Discussion of medication options for treatment of Afib. We discussed flecainide but due to moderate LVH on echocardiogram less favorable to begin this option. We discussed Tikosyn and how it requires 3 night admission for loading. We discussed amiodarone treatment and how it requires monitoring of liver function, lungs, and thyroid.  Unfortunately, she missed dose of Eliquis last night so we have to schedule DCCV in 3 weeks. She cannot begin amiodarone therapy due to risk of chemical conversion to NSR while not being properly anticoagulated. They will discuss AAD therapy options. After discussion, she would like to proceed with DCCV in 3 weeks.   Increase to Toprol 50 mg daily and call on Monday with BP/HR. Will increase this as necessary and BP allows to try and get HR < 100.     2. CHA2DS2VASc  of 5 Continue eliquis 5 mg bid    3, HTN Elevated today. She is going to purchase BP cuff per my recommendation so she can trend BP and HR at home.   Schedule for DCCV in 3 weeks. F/u 1-2 weeks after DCCV.    Lake Bells, PA-C [facility] [address] [place], [place] [address] [phone]

## 2022-09-07 NOTE — Patient Instructions (Signed)
Increase metoprolol to 50mg  once a day -- call on Monday with update of heart rates and blood pressure [phone]   Cardioversion scheduled for: Monday, June 10th   - Arrive at the [facility] and go to admitting at 930am   - Do not eat or drink anything after midnight the night prior to your procedure.   - Take all your morning medication (except diabetic medications) with a sip of water prior to arrival.  - You will not be able to drive home after your procedure.    - Do NOT miss any doses of your blood thinner - if you should miss a dose please notify our office immediately.   - If you feel as if you go back into normal rhythm prior to scheduled cardioversion, please notify our office immediately.   If your procedure is canceled in the cardioversion suite you will be charged a cancellation fee.

## 2022-09-10 ENCOUNTER — Telehealth (HOSPITAL_COMMUNITY): Payer: Self-pay | Admitting: *Deleted

## 2022-09-10 MED ORDER — METOPROLOL SUCCINATE ER 25 MG PO TB24: 75.0000 mg | ORAL_TABLET | Freq: Every day | ORAL | 1 refills | Status: DC

## 2022-09-10 NOTE — Telephone Encounter (Signed)
Pt HRs 125 BP 141/82 as of this morning. Discussed with Landry Mellow PA will increase metoprolol to 75mg  daily - call with update of response later in week. Pt verbalized agreement.

## 2022-09-13 DIAGNOSIS — J069 Acute upper respiratory infection, unspecified: Secondary | ICD-10-CM | POA: Diagnosis not present

## 2022-09-20 ENCOUNTER — Ambulatory Visit (HOSPITAL_COMMUNITY)
Admission: RE | Admit: 2022-09-20 | Discharge: 2022-09-20 | Disposition: A | Discharge status: Home / Self Care | Payer: Medicare HMO | Source: Ambulatory Visit | Attending: Internal Medicine | Admitting: Internal Medicine

## 2022-09-20 DIAGNOSIS — Z7901 Long term (current) use of anticoagulants: Secondary | ICD-10-CM | POA: Insufficient documentation

## 2022-09-20 DIAGNOSIS — Z79899 Other long term (current) drug therapy: Secondary | ICD-10-CM | POA: Diagnosis not present

## 2022-09-20 DIAGNOSIS — I4892 Unspecified atrial flutter: Secondary | ICD-10-CM | POA: Diagnosis not present

## 2022-09-20 LAB — CBC
HCT: 37.3 % (ref 36.0–46.0)
Hemoglobin: 11.8 g/dL — ABNORMAL LOW (ref 12.0–15.0)
MCH: 29.2 pg (ref 26.0–34.0)
MCHC: 31.6 g/dL (ref 30.0–36.0)
MCV: 92.3 fL (ref 80.0–100.0)
Platelets: 191 10*3/uL (ref 150–400)
RBC: 4.04 MIL/uL (ref 3.87–5.11)
RDW: 13.9 % (ref 11.5–15.5)
WBC: 7.7 10*3/uL (ref 4.0–10.5)
nRBC: 0 % (ref 0.0–0.2)

## 2022-09-20 LAB — BASIC METABOLIC PANEL
Anion gap: 11 (ref 5–15)
BUN: 12 mg/dL (ref 8–23)
CO2: 29 mmol/L (ref 22–32)
Calcium: 9.6 mg/dL (ref 8.9–10.3)
Chloride: 99 mmol/L (ref 98–111)
Creatinine, Ser: 0.88 mg/dL (ref 0.44–1.00)
GFR, Estimated: >60 mL/min (ref >60)
Glucose, Bld: 111 mg/dL — ABNORMAL HIGH (ref 70–99)
Potassium: 4.5 mmol/L (ref 3.5–5.1)
Sodium: 139 mmol/L (ref 135–145)

## 2022-09-20 NOTE — H&P (View-Only) (Signed)
Patient is scheduled for DCCV on 6/10 due to missing dose of Eliquis previously.  Bmet and CBC drawn today.   ECG today shows  Vent. rate 113 BPM PR interval 232 ms QRS duration 80 ms QT/QTcB 310/425 ms P-R-T axes * 62 35 Appears to be atrial flutter with RVR Otherwise normal ECG When compared with ECG of 07-Sep-2022 11:39, PREVIOUS ECG IS PRESENT  Patient advised to take Toprol 25 mg 4 tablets (100 mg) daily. Will plan to go down to 25 mg 2 tablets (50 mg) daily after DCCV.    Pt will follow up 1-2 weeks after DCCV.  

## 2022-09-20 NOTE — Progress Notes (Signed)
Patient is scheduled for DCCV on 6/10 due to missing dose of Eliquis previously.  Bmet and CBC drawn today.   ECG today shows  Vent. rate 113 BPM PR interval 232 ms QRS duration 80 ms QT/QTcB 310/425 ms P-R-T axes * 62 35 Appears to be atrial flutter with RVR Otherwise normal ECG When compared with ECG of 07-Sep-2022 11:39, PREVIOUS ECG IS PRESENT  Patient advised to take Toprol 25 mg 4 tablets (100 mg) daily. Will plan to go down to 25 mg 2 tablets (50 mg) daily after DCCV.    Pt will follow up 1-2 weeks after DCCV.

## 2022-09-28 NOTE — Progress Notes (Signed)
Spoke with      , Procedure scheduled for     , Please arrive at the hospital at     , NPO after midnight on           , May take meds with sips of water in the AM, please have transportation for home post procedure, and someone to stay with pt for approximately 24 hours after

## 2022-10-01 ENCOUNTER — Ambulatory Visit (HOSPITAL_COMMUNITY): Payer: Medicare HMO | Admitting: Anesthesiology

## 2022-10-01 ENCOUNTER — Encounter (HOSPITAL_COMMUNITY)
Admission: RE | Disposition: A | Discharge status: Home / Self Care | Payer: Self-pay | Source: Home / Self Care | Attending: Cardiology

## 2022-10-01 ENCOUNTER — Encounter (HOSPITAL_COMMUNITY): Payer: Self-pay | Admitting: Cardiology

## 2022-10-01 ENCOUNTER — Other Ambulatory Visit: Payer: Self-pay

## 2022-10-01 ENCOUNTER — Ambulatory Visit (HOSPITAL_COMMUNITY)
Admission: RE | Admit: 2022-10-01 | Discharge: 2022-10-01 | Disposition: A | Discharge status: Home / Self Care | Payer: Medicare HMO | Attending: Cardiology | Admitting: Cardiology

## 2022-10-01 ENCOUNTER — Ambulatory Visit (HOSPITAL_BASED_OUTPATIENT_CLINIC_OR_DEPARTMENT_OTHER): Payer: Medicare HMO | Admitting: Anesthesiology

## 2022-10-01 DIAGNOSIS — Z87891 Personal history of nicotine dependence: Secondary | ICD-10-CM

## 2022-10-01 DIAGNOSIS — K219 Gastro-esophageal reflux disease without esophagitis: Secondary | ICD-10-CM | POA: Diagnosis not present

## 2022-10-01 DIAGNOSIS — Z7901 Long term (current) use of anticoagulants: Secondary | ICD-10-CM | POA: Insufficient documentation

## 2022-10-01 DIAGNOSIS — I4891 Unspecified atrial fibrillation: Secondary | ICD-10-CM | POA: Diagnosis not present

## 2022-10-01 DIAGNOSIS — I110 Hypertensive heart disease with heart failure: Secondary | ICD-10-CM

## 2022-10-01 DIAGNOSIS — I4892 Unspecified atrial flutter: Secondary | ICD-10-CM

## 2022-10-01 DIAGNOSIS — I491 Atrial premature depolarization: Secondary | ICD-10-CM | POA: Diagnosis not present

## 2022-10-01 DIAGNOSIS — J45909 Unspecified asthma, uncomplicated: Secondary | ICD-10-CM | POA: Diagnosis not present

## 2022-10-01 DIAGNOSIS — I509 Heart failure, unspecified: Secondary | ICD-10-CM

## 2022-10-01 DIAGNOSIS — E0590 Thyrotoxicosis, unspecified without thyrotoxic crisis or storm: Secondary | ICD-10-CM | POA: Insufficient documentation

## 2022-10-01 DIAGNOSIS — I4819 Other persistent atrial fibrillation: Secondary | ICD-10-CM

## 2022-10-01 HISTORY — PX: CARDIOVERSION: SHX1299

## 2022-10-01 SURGERY — CARDIOVERSION
Anesthesia: General

## 2022-10-01 MED ORDER — PHENYLEPHRINE HCL (PRESSORS) 10 MG/ML IV SOLN
INTRAVENOUS | Status: DC | PRN
  Administered 2022-10-01: 80 ug via INTRAVENOUS

## 2022-10-01 MED ORDER — PROPOFOL 10 MG/ML IV BOLUS
INTRAVENOUS | Status: DC | PRN
  Administered 2022-10-01: 70 mg via INTRAVENOUS

## 2022-10-01 MED ORDER — SODIUM CHLORIDE 0.9 % IV SOLN: INTRAVENOUS | Status: DC

## 2022-10-01 MED ORDER — METOPROLOL SUCCINATE ER 25 MG PO TB24: 50.0000 mg | ORAL_TABLET | Freq: Every day | ORAL | 1 refills | Status: DC

## 2022-10-01 MED ORDER — LIDOCAINE 2% (20 MG/ML) 5 ML SYRINGE
INTRAMUSCULAR | Status: DC | PRN
  Administered 2022-10-01: 60 mg via INTRAVENOUS

## 2022-10-01 SURGICAL SUPPLY — 1 items: ELECT DEFIB PAD ADLT CADENCE (PAD) ×1 IMPLANT

## 2022-10-01 NOTE — CV Procedure (Addendum)
    Electrical Cardioversion Procedure Note Bianca Wolf [id number] [birth year]  Procedure: Electrical Cardioversion Indications:  Atrial Flutter  Time Out: Verified patient identification, verified procedure,medications/allergies/relevent history reviewed, required imaging and test results available.  Performed  Procedure Details  The patient was NPO after midnight. Anesthesia was administered at the beside  by Dr.Miller with 70mg  of propofol and 60mg  of Lidocaine.  Cardioversion was done with synchronized biphasic defibrillation with AP pads with 150watts.  The patient converted to normal sinus rhythm. The patient tolerated the procedure well   IMPRESSION:  Successful cardioversion of atrial flutter  Will Decrease Toprol XL from 100mg  daily to 50mg  daily as noted in recent note 09/20/2022   Bianca Wolf 10/01/2022, 9:47 AM

## 2022-10-01 NOTE — Transfer of Care (Signed)
Immediate Anesthesia Transfer of Care Note  Patient: Bianca Wolf  Procedure(s) Performed: CARDIOVERSION  Patient Location: Cath Lab  Anesthesia Type:General  Level of Consciousness: drowsy and patient cooperative  Airway & Oxygen Therapy: Patient Spontanous Breathing and Patient connected to nasal cannula oxygen  Post-op Assessment: Report given to RN, Post -op Vital signs reviewed and stable, and Patient moving all extremities X 4  Post vital signs: Reviewed and stable  Last Vitals:  Vitals Value Taken Time  BP 86/48 10/01/22 1041  Temp    Pulse 53 10/01/22 1042  Resp 14 10/01/22 1042  SpO2 93 % 10/01/22 1042    Last Pain:  Vitals:   10/01/22 0951  TempSrc:   PainSc: 0-No pain         Complications: No notable events documented. Treating with BP

## 2022-10-01 NOTE — Interval H&P Note (Signed)
History and Physical Interval Note:  10/01/2022 9:47 AM  Bianca Wolf  has presented today for surgery, with the diagnosis of AFIB.  The various methods of treatment have been discussed with the patient and family. After consideration of risks, benefits and other options for treatment, the patient has consented to  Procedure(s): CARDIOVERSION (N/A) as a surgical intervention.  The patient's history has been reviewed, patient examined, no change in status, stable for surgery.  I have reviewed the patient's chart and labs.  Questions were answered to the patient's satisfaction.     Armanda Magic

## 2022-10-01 NOTE — Anesthesia Preprocedure Evaluation (Signed)
Anesthesia Evaluation  Patient identified by MRN, date of birth, ID band Patient awake    Reviewed: Allergy & Precautions, NPO status , Patient's Chart, lab work & pertinent test results, reviewed documented beta blocker date and time   Airway Mallampati: II  TM Distance: >3 FB Neck ROM: Full    Dental  (+) Lower Dentures, Upper Dentures   Pulmonary asthma , former smoker   Pulmonary exam normal breath sounds clear to auscultation       Cardiovascular hypertension, Pt. on medications and Pt. on home beta blockers +CHF  + dysrhythmias Atrial Fibrillation  Rhythm:Irregular Rate:Normal     Neuro/Psych negative neurological ROS  negative psych ROS   GI/Hepatic Neg liver ROS,GERD  Medicated and Controlled,,  Endo/Other   Hyperthyroidism   Renal/GU Renal disease  negative genitourinary   Musculoskeletal negative musculoskeletal ROS (+)    Abdominal   Peds  Hematology Eliquis therapy- last dose this am   Anesthesia Other Findings   Reproductive/Obstetrics                             Anesthesia Physical Anesthesia Plan  ASA: 3  Anesthesia Plan: General   Post-op Pain Management: Minimal or no pain anticipated   Induction: Intravenous  PONV Risk Score and Plan: 2 and Treatment may vary due to age or medical condition, Propofol infusion and Ondansetron  Airway Management Planned: Mask  Additional Equipment: None  Intra-op Plan:   Post-operative Plan:   Informed Consent: I have reviewed the patients History and Physical, chart, labs and discussed the procedure including the risks, benefits and alternatives for the proposed anesthesia with the patient or authorized representative who has indicated his/her understanding and acceptance.       Plan Discussed with: Anesthesiologist and CRNA  Anesthesia Plan Comments:        Anesthesia Quick Evaluation

## 2022-10-01 NOTE — Discharge Instructions (Signed)

## 2022-10-01 NOTE — Anesthesia Postprocedure Evaluation (Signed)
Anesthesia Post Note  Patient: Bianca Wolf  Procedure(s) Performed: CARDIOVERSION     Patient location during evaluation: PACU Anesthesia Type: General Level of consciousness: awake and alert Pain management: pain level controlled Vital Signs Assessment: post-procedure vital signs reviewed and stable Respiratory status: spontaneous breathing, nonlabored ventilation and respiratory function stable Cardiovascular status: blood pressure returned to baseline and stable Postop Assessment: no apparent nausea or vomiting Anesthetic complications: no   No notable events documented.  Last Vitals:  Vitals:   10/01/22 1115 10/01/22 1130  BP: 117/80 110/71  Pulse: (!) 59 (!) 57  Resp:  (!) 29  Temp:    SpO2:  95%    Last Pain:  Vitals:   10/01/22 1104  TempSrc: Temporal  PainSc: 0-No pain                 Lowella Curb

## 2022-10-02 ENCOUNTER — Encounter (HOSPITAL_COMMUNITY): Payer: Self-pay | Admitting: Cardiology

## 2022-10-10 ENCOUNTER — Telehealth: Payer: Self-pay | Admitting: Cardiology

## 2022-10-10 ENCOUNTER — Ambulatory Visit (HOSPITAL_COMMUNITY)
Admission: RE | Admit: 2022-10-10 | Discharge: 2022-10-10 | Disposition: A | Discharge status: Home / Self Care | Payer: Medicare HMO | Source: Ambulatory Visit | Attending: Internal Medicine | Admitting: Internal Medicine

## 2022-10-10 ENCOUNTER — Encounter (HOSPITAL_COMMUNITY): Payer: Self-pay | Admitting: Internal Medicine

## 2022-10-10 VITALS — BP 124/84 | HR 59 | Ht 63.0 in | Wt 171.8 lb

## 2022-10-10 DIAGNOSIS — D6869 Other thrombophilia: Secondary | ICD-10-CM

## 2022-10-10 DIAGNOSIS — I4819 Other persistent atrial fibrillation: Secondary | ICD-10-CM | POA: Diagnosis not present

## 2022-10-10 DIAGNOSIS — I10 Essential (primary) hypertension: Secondary | ICD-10-CM | POA: Insufficient documentation

## 2022-10-10 DIAGNOSIS — Z87891 Personal history of nicotine dependence: Secondary | ICD-10-CM | POA: Diagnosis not present

## 2022-10-10 DIAGNOSIS — Z7901 Long term (current) use of anticoagulants: Secondary | ICD-10-CM | POA: Diagnosis not present

## 2022-10-10 MED ORDER — AMIODARONE HCL 200 MG PO TABS: ORAL_TABLET | ORAL | 0 refills | Status: DC

## 2022-10-10 NOTE — Progress Notes (Signed)
Primary Care Physician: Merri Brunette, MD Referring Physician: Dr. Lavinia Sharps is a 82 y.o. female with a h/o HTN, new dx of atrial fibrillation being admitted 02/06/22 with RVR and undergoing successful TEE/DCCV on 02/09/22. She was placed on eliquis 25 mg bid for a CHA2DS2VASc  score of  5. Echo showed EF of 45-50%.   She was seen by Dr. Bjorn Pippin on 02/15/22, and was c/o of shortness of breath and fatigue. Her metoprolol was cut in half. She was scheduled to have f/u here. She remains in SR with reduction of BB with  her shortness of breath and fatigue being improved. Her BP was elevated on presentation at 208/76 but on recheck some 15 mins later it was 148/68. She saw her PCP yesterday and she talked like after blood results are known the provider would discuss a BP agent as her BP was elevated there as well. She works part time at [employer]. She has not had any awarenss of afib since the cardioversion. She is tolerating eliquis well, no issues with bleeding.   On follow up today 09/07/22, she is currently in atrial flutter with RVR. She missed her dose of Eliquis last night. Seen by Dr. Bjorn Pippin on 08/23/22 and noted to be in Afib with RVR. She was only taking Eliquis 5 mg once daily, advised to take correct twice daily dosage, and plan for DCCV after 3 weeks of anticoagulation and discuss AAD. She is a little short of breath on exertion when in Afib. She currently has a cold and cough with it.   On follow up 10/10/22, she is currently in NSR. S/p successful DCCV on 10/01/22. She feels well overall. No missed doses of Eliquis.   Today, she denies symptoms of palpitations, chest pain, shortness of breath, orthopnea, PND, lower extremity edema, dizziness, presyncope, syncope, or neurologic sequela. The patient is tolerating medications without difficulties and is otherwise without complaint today.   Past Medical History:  Diagnosis Date   Atrial fibrillation (HCC)    GERD  (gastroesophageal reflux disease)    Hypertension    Past Surgical History:  Procedure Laterality Date   BUBBLE STUDY  02/09/2022   Procedure: BUBBLE STUDY;  Surgeon: Parke Poisson, MD;  Location: [place] ENDOSCOPY;  Service: Cardiovascular;;   CARDIOVERSION N/A 02/09/2022   Procedure: CARDIOVERSION;  Surgeon: Parke Poisson, MD;  Location: [facility] ENDOSCOPY;  Service: Cardiovascular;  Laterality: N/A;   CARDIOVERSION N/A 10/01/2022   Procedure: CARDIOVERSION;  Surgeon: Quintella Reichert, MD;  Location: [facility] INVASIVE CV LAB;  Service: Cardiovascular;  Laterality: N/A;   IR KYPHO LUMBAR INC FX REDUCE BONE BX UNI/BIL CANNULATION INC/IMAGING  09/21/2021   TEE WITHOUT CARDIOVERSION N/A 02/09/2022   Procedure: TRANSESOPHAGEAL ECHOCARDIOGRAM (TEE);  Surgeon: Parke Poisson, MD;  Location: [facility] ENDOSCOPY;  Service: Cardiovascular;  Laterality: N/A;    Current Outpatient Medications  Medication Sig Dispense Refill   acetaminophen (TYLENOL) 325 MG tablet Take 2 tablets (650 mg total) by mouth every 6 (six) hours as needed. 30 tablet 0   apixaban (ELIQUIS) 5 MG TABS tablet Take 1 tablet (5 mg total) by mouth 2 (two) times daily. 60 tablet 1   benazepril (LOTENSIN) 20 MG tablet Take 20 mg by mouth in the morning.     Calcium Citrate-Vitamin D (CITRACAL + D PO) Take 1 tablet by mouth every morning.     cholecalciferol (VITAMIN D3) 25 MCG (1000 UNIT) tablet Take 1,000 Units by mouth in the morning.  furosemide (LASIX) 20 MG tablet Take 1 tablet (20 mg total) by mouth daily as needed (for weight increase of 3 lbs overnight or 5 lbs in 1 week). 30 tablet 3   metoprolol succinate (TOPROL-XL) 25 MG 24 hr tablet Take 2 tablets (50 mg total) by mouth daily. Take with or immediately following a meal. 270 tablet 1   omeprazole (PRILOSEC) 20 MG capsule Take 20 mg by mouth daily before breakfast.     No current facility-administered medications for this visit.    No Known Allergies  Social History    Socioeconomic History   Marital status: Widowed    Spouse name: Not on file   Number of children: Not on file   Years of education: Not on file   Highest education level: Not on file  Occupational History   Not on file  Tobacco Use   Smoking status: Former    Packs/day: 1.00    Years: 25.00    Additional pack years: 0.00    Total pack years: 25.00    Types: Cigarettes    Quit date: 100    Years since quitting: 44.4   Smokeless tobacco: Never  Substance and Sexual Activity   Alcohol use: No   Drug use: No   Sexual activity: Yes    Birth control/protection: None  Other Topics Concern   Not on file  Social History Narrative   Not on file   Social Determinants of Health   Financial Resource Strain: Not on file  Food Insecurity: Not on file  Transportation Needs: Not on file  Physical Activity: Not on file  Stress: Not on file  Social Connections: Not on file  Intimate Partner Violence: Not on file    Family History  Problem Relation Age of Onset   Atrial fibrillation Neg Hx     ROS- All systems are reviewed and negative except as per the HPI above  Physical Exam: There were no vitals filed for this visit.  Wt Readings from Last 3 Encounters:  10/01/22 76.7 kg  09/07/22 76.7 kg  08/23/22 76.7 kg    Labs: Lab Results  Component Value Date   NA 139 09/20/2022   K 4.5 09/20/2022   CL 99 09/20/2022   CO2 29 09/20/2022   GLUCOSE 111 (H) 09/20/2022   BUN 12 09/20/2022   CREATININE 0.88 09/20/2022   CALCIUM 9.6 09/20/2022   PHOS 4.0 02/10/2022   MG 1.8 02/10/2022   Lab Results  Component Value Date   INR 1.2 02/08/2022   No results found for: "CHOL", "HDL", "LDLCALC", "TRIG"  GEN- The patient is well appearing, alert and oriented x 3 today.   Head- normocephalic, atraumatic Eyes-  Sclera clear, conjunctiva pink Ears- hearing intact Lungs- Clear to ausculation bilaterally, normal work of breathing Heart- Regular rate and rhythm, no murmurs, rubs  or gallops, PMI not laterally displaced Extremities- no clubbing, cyanosis, or edema MS- no significant deformity or atrophy Skin- no rash or lesion Psych- euthymic mood, full affect Neuro- strength and sensation are intact   EKG- Vent. rate 59 BPM PR interval 134 ms QRS duration 82 ms QT/QTcB 440/435 ms P-R-T axes 83 73 62 Sinus bradycardia Otherwise normal ECG When compared with ECG of 01-Oct-2022 10:49, PREVIOUS ECG IS PRESENT  Echo 06/05/22:  1. Left ventricular ejection fraction, by estimation, is 65 to 70%. The  left ventricle has normal function. The left ventricle has no regional  wall motion abnormalities. There is moderate concentric left ventricular  hypertrophy. Left ventricular  diastolic parameters are consistent with Grade II diastolic dysfunction  (pseudonormalization).   2. Right ventricular systolic function is normal. The right ventricular  size is normal. There is moderately elevated pulmonary artery systolic  pressure.   3. Left atrial size was severely dilated.   4. Right atrial size was mildly dilated.   5. The mitral valve is normal in structure. Trivial mitral valve  regurgitation. No evidence of mitral stenosis.   6. The aortic valve is tricuspid. Aortic valve regurgitation is not  visualized. No aortic stenosis is present.   7. The inferior vena cava is normal in size with greater than 50%  respiratory variability, suggesting right atrial pressure of 3 mmHg.   Assessment and Plan:  1. Persistent Afib Cardioverted in the hospital 02/10/22  S/p successful DCCV on 10/01/22.   She is in NSR.  Discussion of medication options for treatment of Afib. We discussed flecainide but due to moderate LVH on echocardiogram less favorable to begin this option. We discussed Tikosyn and how it requires 3 night admission for loading. We discussed amiodarone treatment and how it requires monitoring of liver function, lungs, and thyroid. We also discussed ablation as  a procedure for treatment. After discussion, she would like to proceed with amiodarone.  Begin amiodarone 200 mg BID x 2 weeks, then 200 mg daily. Repeat ECG in 2 weeks. Will refer to Dr. Lalla Brothers to discuss Watchman procedure.    2. CHA2DS2VASc  of 5 Continue eliquis 5 mg bid    3. HTN Stable today no changes.  Repeat ECG in 2 weeks.   Lake Bells, PA-C [facility] [address] [place], [place] [address] [phone]

## 2022-10-10 NOTE — Telephone Encounter (Signed)
Spoke to patient's daughter Bianca Wolf.Stated mother has been sob for the past 2 months.Sob seems to be getting worse.She has swelling in both lower legs, but is no worse.She has gained appox 2 lbs.She saw Afib clinic this morning but forget to tell them.She has not been taking Lasix.Advised to take Lasix 20 mg daily for the next 3 days then take as needed.Appointment scheduled with Joni Reining DNP 6/24 at 8:25 am.I will make Dr.Schumann aware.

## 2022-10-10 NOTE — Patient Instructions (Signed)
Start Amiodarone 200mg -- take 1 tablet twice a day for 14 days then reduce to once a day - take with food 

## 2022-10-10 NOTE — Telephone Encounter (Signed)
Pt c/o Shortness Of Breath: STAT if SOB developed within the last 24 hours or pt is noticeably SOB on the phone  1. Are you currently SOB (can you hear that pt is SOB on the phone)? Daughter states she is SOB  2. How long have you been experiencing SOB? For a few months  3. Are you SOB when sitting or when up moving around? Both, but worse when moving  4. Are you currently experiencing any other symptoms? Daughter states no other symptoms.

## 2022-10-11 NOTE — Telephone Encounter (Signed)
Agree with plan 

## 2022-10-12 NOTE — Progress Notes (Unsigned)
Cardiology Clinic Note   Patient Name: Bianca Wolf Date of Encounter: 10/15/2022  Primary Care Provider:  Merri Brunette, MD Primary Cardiologist:  Little Ishikawa, MD  Patient Profile    82 year old female with h/o HTN, atrial fib, s/p DCCV on 02/12/2022,and 10/01/2022, on amiodarone,  CHADS VASC Score of 5, EF of 45-50% per echo. Last seen in the office on 10/10/2022 amiodarone started at 200 mg BID for two weeks then 200 mg daily by Lake Bells, PA.   Past Medical History    Past Medical History:  Diagnosis Date   Atrial fibrillation (HCC)    GERD (gastroesophageal reflux disease)    Hypertension    Past Surgical History:  Procedure Laterality Date   BUBBLE STUDY  02/09/2022   Procedure: BUBBLE STUDY;  Surgeon: Parke Poisson, MD;  Location: [facility] ENDOSCOPY;  Service: Cardiovascular;;   CARDIOVERSION N/A 02/09/2022   Procedure: CARDIOVERSION;  Surgeon: Parke Poisson, MD;  Location: [facility] ENDOSCOPY;  Service: Cardiovascular;  Laterality: N/A;   CARDIOVERSION N/A 10/01/2022   Procedure: CARDIOVERSION;  Surgeon: Quintella Reichert, MD;  Location: [facility] INVASIVE CV LAB;  Service: Cardiovascular;  Laterality: N/A;   IR KYPHO LUMBAR INC FX REDUCE BONE BX UNI/BIL CANNULATION INC/IMAGING  09/21/2021   TEE WITHOUT CARDIOVERSION N/A 02/09/2022   Procedure: TRANSESOPHAGEAL ECHOCARDIOGRAM (TEE);  Surgeon: Parke Poisson, MD;  Location: [facility] ENDOSCOPY;  Service: Cardiovascular;  Laterality: N/A;    Allergies  No Known Allergies  History of Present Illness    Bianca Wolf presents today with complaints of worsening dyspnea, LEE with weight gain. She had not been taking lasix. She was advised to take lasix 20 mg daily until seen on follow up.    She comes today with her daughter with complaints of confusion, dyspnea on exertion, and some at rest, generalized feeling of fatigue.  This has been a occurring over the last month and has been progressive.  Lasix was helpful temporarily  with her breathing, she does not complain of any swelling chest pain she does have some transient dizziness.  She denies any limb weakness, trouble speaking, swallowing, she does have a progressive gait disturbance which has been going on for years according to her daughter.  She states she feels best in the morning but by the end of the day she has headaches and feels unsettled and confused.  She has become forgetful concerning her medications especially in the evening.  She does have a [place] container with days divided.  She does not think that she is forgetting to take her medicines in the morning but she believes sometimes she does forget to take them at nighttime.  Home Medications    Current Outpatient Medications  Medication Sig Dispense Refill   acetaminophen (TYLENOL) 325 MG tablet Take 2 tablets (650 mg total) by mouth every 6 (six) hours as needed. 30 tablet 0   amiodarone (PACERONE) 200 MG tablet Take 1 tablet by mouth twice a day for 14 days then reduce to once a day 45 tablet 0   apixaban (ELIQUIS) 5 MG TABS tablet Take 1 tablet (5 mg total) by mouth 2 (two) times daily. 60 tablet 1   benazepril (LOTENSIN) 20 MG tablet Take 20 mg by mouth in the morning.     Calcium Citrate-Vitamin D (CITRACAL + D PO) Take 1 tablet by mouth every morning.     cholecalciferol (VITAMIN D3) 25 MCG (1000 UNIT) tablet Take 1,000 Units by mouth in the morning.  furosemide (LASIX) 20 MG tablet Take 1 tablet (20 mg total) by mouth daily as needed (for weight increase of 3 lbs overnight or 5 lbs in 1 week). 30 tablet 3   metoprolol succinate (TOPROL-XL) 25 MG 24 hr tablet Take 2 tablets (50 mg total) by mouth daily. Take with or immediately following a meal. 270 tablet 1   omeprazole (PRILOSEC) 20 MG capsule Take 20 mg by mouth daily before breakfast.     No current facility-administered medications for this visit.     Family History    Family History  Problem Relation Age of Onset   Hypertension  Mother    Diabetes Mother    Heart disease Father    Hypertension Father    Atrial fibrillation Neg Hx    She indicated that the status of her mother is unknown. She indicated that the status of her father is unknown. She indicated that the status of her neg hx is unknown.  Social History    Social History   Socioeconomic History   Marital status: Widowed    Spouse name: Not on file   Number of children: Not on file   Years of education: Not on file   Highest education level: Not on file  Occupational History   Not on file  Tobacco Use   Smoking status: Former    Packs/day: 1.00    Years: 25.00    Additional pack years: 0.00    Total pack years: 25.00    Types: Cigarettes    Quit date: 42    Years since quitting: 44.5   Smokeless tobacco: Never  Vaping Use   Vaping Use: Never used  Substance and Sexual Activity   Alcohol use: No   Drug use: No   Sexual activity: Yes    Birth control/protection: None  Other Topics Concern   Not on file  Social History Narrative   Not on file   Social Determinants of Health   Financial Resource Strain: Not on file  Food Insecurity: Not on file  Transportation Needs: Not on file  Physical Activity: Not on file  Stress: Not on file  Social Connections: Not on file  Intimate Partner Violence: Not on file     Review of Systems    General:  No chills, fever, night sweats or weight changes.  Cardiovascular:  No chest pain, positive for dyspnea on exertion, edema, orthopnea, palpitations, paroxysmal nocturnal dyspnea. Dermatological: No rash, lesions/masses Respiratory: Positive for cough, dyspnea Urologic: No hematuria, dysuria Abdominal:   No nausea, vomiting, diarrhea, bright red blood per rectum, melena, or hematemesis Neurologic:  No visual changes, wkns, changes in mental status. All other systems reviewed and are otherwise negative except as noted above.     Physical Exam    VS:  BP (!) 172/90 (BP Location: Left  Arm, Patient Position: Sitting, Cuff Size: Normal)   Pulse 73   Ht 5\' 3"  (1.6 m)   Wt 168 lb 6.4 oz (76.4 kg)   SpO2 93%   BMI 29.83 kg/m  , BMI Body mass index is 29.83 kg/m.     GEN: Well nourished, well developed, in no acute distress. HEENT: normal. Neck: Supple, no JVD, carotid bruits, or masses. Cardiac: RRR, no murmurs, rubs, or gallops. No clubbing, cyanosis, edema.  Radials/DP/PT 1+ and equal bilaterally.  Respiratory:  Respirations regular and unlabored, some crackles in the right base,  GI: Soft, nontender, nondistended, BS + x 4. MS: no deformity or atrophy. Skin: warm  and dry, no rash. Neuro:  Strength and sensation are intact. Psych: Normal affect.  Accessory Clinical Findings     EKG: Normal sinus rhythm, heart rate 73 bpm with nonspecific ST-T wave abnormality, no change since June 2024.  Lab Results  Component Value Date   WBC 7.7 09/20/2022   HGB 11.8 (L) 09/20/2022   HCT 37.3 09/20/2022   MCV 92.3 09/20/2022   PLT 191 09/20/2022   Lab Results  Component Value Date   CREATININE 0.88 09/20/2022   BUN 12 09/20/2022   NA 139 09/20/2022   K 4.5 09/20/2022   CL 99 09/20/2022   CO2 29 09/20/2022   Lab Results  Component Value Date   ALT 8 08/23/2022   AST 15 08/23/2022   ALKPHOS 74 08/23/2022   BILITOT 0.4 08/23/2022   No results found for: "CHOL", "HDL", "LDLCALC", "LDLDIRECT", "TRIG", "CHOLHDL"  No results found for: "HGBA1C"  Review of Prior Studies     Echo 06/05/22:  1. Left ventricular ejection fraction, by estimation, is 65 to 70%. The  left ventricle has normal function. The left ventricle has no regional  wall motion abnormalities. There is moderate concentric left ventricular  hypertrophy. Left ventricular  diastolic parameters are consistent with Grade II diastolic dysfunction  (pseudonormalization).   2. Right ventricular systolic function is normal. The right ventricular  size is normal. There is moderately elevated pulmonary artery  systolic  pressure.   3. Left atrial size was severely dilated.   4. Right atrial size was mildly dilated.   5. The mitral valve is normal in structure. Trivial mitral valve  regurgitation. No evidence of mitral stenosis.   6. The aortic valve is tricuspid. Aortic valve regurgitation is not  visualized. No aortic stenosis is present.   7. The inferior vena cava is normal in size with greater than 50%  respiratory variability, suggesting right atrial pressure of 3 mmHg.    Assessment & Plan   1.  Hypertension: Blood pressure is not well-controlled today.  I did recheck it in the clinic room and found to be 168/80.  She states she has taken her medications today, but her daughter will double check when they go home.  She should be on benazepril 20 mg daily.  I am going to check a CMET to evaluate for electrolyte disturbances and kidney function.  2.  Paroxysmal atrial fibrillation: Remains on amiodarone 200 mg daily, apixaban 5 mg twice a day, Toprol 50 mg daily.  The patient forgets to take her Eliquis in the evening and her daughter will help to remind her and check that she is taking it when she goes home.  I will check a CBC to evaluate for anemia.  3.  New onset confusion: Noticing changes in cognitive ability and more confusion in the evening with feelings of disorientation and "not feeling herself" by the evening with associated headache.  Some going to check a CT scan of the brain with and without contrast to evaluate for infarcts, bleeds, or any other abnormalities.  I will also check a UA to rule out UTI.  C&S will be added.  Also check a vitamin D level.  4.  Dyspnea: Chest x-ray to rule out CHF or infective process.         Signed, Bettey Mare. Liborio Nixon, ANP, AACC   10/15/2022 9:53 AM      Office [phone] Fax [phone]  Notice: This dictation was prepared with Dragon dictation along with smaller phrase technology.  Any transcriptional errors that result from this  process are unintentional and may not be corrected upon review.

## 2022-10-15 ENCOUNTER — Ambulatory Visit
Admission: RE | Admit: 2022-10-15 | Discharge: 2022-10-15 | Disposition: A | Discharge status: Home / Self Care | Payer: Medicare HMO | Source: Ambulatory Visit | Attending: Adult Health | Admitting: Adult Health

## 2022-10-15 ENCOUNTER — Encounter: Payer: Self-pay | Admitting: Adult Health

## 2022-10-15 ENCOUNTER — Ambulatory Visit: Payer: Medicare HMO | Attending: Adult Health | Admitting: Adult Health

## 2022-10-15 VITALS — BP 172/90 | HR 73 | Ht 63.0 in | Wt 168.4 lb

## 2022-10-15 DIAGNOSIS — R410 Disorientation, unspecified: Secondary | ICD-10-CM

## 2022-10-15 DIAGNOSIS — N3000 Acute cystitis without hematuria: Secondary | ICD-10-CM

## 2022-10-15 DIAGNOSIS — I480 Paroxysmal atrial fibrillation: Secondary | ICD-10-CM

## 2022-10-15 DIAGNOSIS — J449 Chronic obstructive pulmonary disease, unspecified: Secondary | ICD-10-CM | POA: Diagnosis not present

## 2022-10-15 DIAGNOSIS — I509 Heart failure, unspecified: Secondary | ICD-10-CM

## 2022-10-15 DIAGNOSIS — R0602 Shortness of breath: Secondary | ICD-10-CM | POA: Diagnosis not present

## 2022-10-15 DIAGNOSIS — I517 Cardiomegaly: Secondary | ICD-10-CM | POA: Diagnosis not present

## 2022-10-15 DIAGNOSIS — R059 Cough, unspecified: Secondary | ICD-10-CM | POA: Diagnosis not present

## 2022-10-15 DIAGNOSIS — I10 Essential (primary) hypertension: Secondary | ICD-10-CM

## 2022-10-15 DIAGNOSIS — E559 Vitamin D deficiency, unspecified: Secondary | ICD-10-CM | POA: Diagnosis not present

## 2022-10-15 NOTE — Patient Instructions (Signed)
Medication Instructions:  No Changes *If you need a refill on your cardiac medications before your next appointment, please call your pharmacy*   Lab Work: CBC, CMET, Vitamin D Level, U/A  W/reflex If you have labs (blood work) drawn today and your tests are completely normal, you will receive your results only by: MyChart Message (if you have MyChart) OR A paper copy in the mail If you have any lab test that is abnormal or we need to change your treatment, we will call you to review the results.   Testing/Procedures: [facility]. Non-Cardiac CT scanning, (CAT scanning), is a noninvasive, special x-ray that produces cross-sectional images of the body using x-rays and a computer. CT scans help physicians diagnose and treat medical conditions. For some CT exams, a contrast material is used to enhance visibility in the area of the body being studied. CT scans provide greater clarity and reveal more details than regular x-ray exams.    [facility] , [address]. A chest x-ray takes a picture of the organs and structures inside the chest, including the heart, lungs, and blood vessels. This test can show several things, including, whether the heart is enlarges; whether fluid is building up in the lungs; and whether pacemaker / defibrillator leads are still in place.     Follow-Up: At [facility], you and your health needs are our priority.  As part of our continuing mission to provide you with exceptional heart care, we have created designated Provider Care Teams.  These Care Teams include your primary Cardiologist (physician) and Advanced Practice Providers (APPs -  Physician Assistants and Nurse Practitioners) who all work together to provide you with the care you need, when you need it.  We recommend signing up for the patient portal called "MyChart".  Sign up information is provided on this After Visit Summary.  MyChart is used to connect with patients for  Virtual Visits (Telemedicine).  Patients are able to view lab/test results, encounter notes, upcoming appointments, etc.  Non-urgent messages can be sent to your provider as well.   To learn more about what you can do with MyChart, go to [email or web address].    Your next appointment:   1 week(s)  Provider:   First Available APP

## 2022-10-16 LAB — COMPREHENSIVE METABOLIC PANEL
ALT: 8 IU/L (ref 0–32)
AST: 14 IU/L (ref 0–40)
Albumin: 4.1 g/dL (ref 3.7–4.7)
Alkaline Phosphatase: 77 IU/L (ref 44–121)
BUN/Creatinine Ratio: 14 (ref 12–28)
BUN: 10 mg/dL (ref 8–27)
Bilirubin Total: 1 mg/dL (ref 0.0–1.2)
CO2: 31 mmol/L — ABNORMAL HIGH (ref 20–29)
Calcium: 10.1 mg/dL (ref 8.7–10.3)
Chloride: 100 mmol/L (ref 96–106)
Creatinine, Ser: 0.74 mg/dL (ref 0.57–1.00)
Globulin, Total: 3.4 g/dL (ref 1.5–4.5)
Glucose: 104 mg/dL — ABNORMAL HIGH (ref 70–99)
Potassium: 3.7 mmol/L (ref 3.5–5.2)
Sodium: 145 mmol/L — ABNORMAL HIGH (ref 134–144)
Total Protein: 7.5 g/dL (ref 6.0–8.5)
eGFR: 81 mL/min/{1.73_m2} (ref >59)

## 2022-10-16 LAB — UA/M W/RFLX CULTURE, ROUTINE
Bilirubin, UA: NEGATIVE
Glucose, UA: NEGATIVE
Ketones, UA: NEGATIVE
Leukocytes,UA: NEGATIVE
Nitrite, UA: NEGATIVE
Specific Gravity, UA: 1.013 (ref 1.005–1.030)
Urobilinogen, Ur: 0.2 mg/dL (ref 0.2–1.0)
pH, UA: 7.5 (ref 5.0–7.5)

## 2022-10-16 LAB — CBC
Hematocrit: 37.7 % (ref 34.0–46.6)
Hemoglobin: 11.9 g/dL (ref 11.1–15.9)
MCH: 28.3 pg (ref 26.6–33.0)
MCHC: 31.6 g/dL (ref 31.5–35.7)
MCV: 90 fL (ref 79–97)
Platelets: 250 10*3/uL (ref 150–450)
RBC: 4.21 x10E6/uL (ref 3.77–5.28)
RDW: 13.5 % (ref 11.7–15.4)
WBC: 6.6 10*3/uL (ref 3.4–10.8)

## 2022-10-16 LAB — MICROSCOPIC EXAMINATION
Bacteria, UA: NONE SEEN
Casts: NONE SEEN /lpf
Epithelial Cells (non renal): NONE SEEN /hpf (ref 0–10)

## 2022-10-16 LAB — VITAMIN D 25 HYDROXY (VIT D DEFICIENCY, FRACTURES): Vit D, 25-Hydroxy: 42.5 ng/mL (ref 30.0–100.0)

## 2022-10-17 ENCOUNTER — Telehealth: Payer: Self-pay

## 2022-10-17 NOTE — Telephone Encounter (Addendum)
Called patient regarding results. Left message for patient to call office.----- Message from Jodelle Gross, NP sent at 10/16/2022  7:04 AM EDT ----- Reviewed her labs. She has the beginnings of a UTI. Bacteria is not present yet.  Some blood in her urine. She is not anemic. Sodium is a little elevated, needs to take in more fluids. Vitamin D is okay.  Awaiting culture of urine for verification of growth.  Awaiting CXR and CT scan. Will wait for full results before treating her. KL

## 2022-10-18 ENCOUNTER — Telehealth: Payer: Self-pay

## 2022-10-18 NOTE — Telephone Encounter (Signed)
Called patient regarding results. Unable to leave message due to voicemail full.

## 2022-10-18 NOTE — Telephone Encounter (Signed)
Daughter is aware of results and she states mom has been a little confused since visit. Did inform her confusion is common in elderly patient's who havre UTI's

## 2022-10-18 NOTE — Telephone Encounter (Signed)
Daughter returned CMA's call regarding results. 

## 2022-10-19 DIAGNOSIS — R350 Frequency of micturition: Secondary | ICD-10-CM | POA: Diagnosis not present

## 2022-10-19 DIAGNOSIS — N390 Urinary tract infection, site not specified: Secondary | ICD-10-CM | POA: Diagnosis not present

## 2022-10-19 DIAGNOSIS — I4891 Unspecified atrial fibrillation: Secondary | ICD-10-CM | POA: Diagnosis not present

## 2022-10-19 MED ORDER — AMOXICILLIN 500 MG PO TABS
500.0000 mg | ORAL_TABLET | Freq: Two times a day (BID) | ORAL | 0 refills | Status: AC
Start: 2022-10-19 — End: 2022-11-02

## 2022-10-19 NOTE — Telephone Encounter (Signed)
Daughter calling back for update. Please advise  

## 2022-10-19 NOTE — Telephone Encounter (Signed)
Informed patient daughter of your message and she stated can you prescribe her an antibiotic. States her pcp closes early today and she may not be able to get her in until next week

## 2022-10-19 NOTE — Addendum Note (Signed)
Addended by: Jodelle Gross on: 10/19/2022 05:27 PM   Modules accepted: Orders

## 2022-10-19 NOTE — Telephone Encounter (Signed)
Informed patient's daughter that provider is in clinic and have not responded as of yet. She did not reach out to PCP as suggested. She states she may take her to urgent care.

## 2022-10-22 NOTE — Telephone Encounter (Signed)
Daughter took her to urgent care and she is currently on ABT

## 2022-10-24 ENCOUNTER — Encounter: Payer: Self-pay | Admitting: Nurse Practitioner

## 2022-10-24 ENCOUNTER — Other Ambulatory Visit: Payer: Self-pay

## 2022-10-24 ENCOUNTER — Ambulatory Visit (INDEPENDENT_AMBULATORY_CARE_PROVIDER_SITE_OTHER): Payer: Medicare HMO | Admitting: Nurse Practitioner

## 2022-10-24 ENCOUNTER — Ambulatory Visit
Admission: RE | Admit: 2022-10-24 | Discharge: 2022-10-24 | Disposition: A | Discharge status: Home / Self Care | Payer: Medicare HMO | Source: Ambulatory Visit | Attending: Internal Medicine | Admitting: Internal Medicine

## 2022-10-24 VITALS — HR 47

## 2022-10-24 VITALS — BP 118/52 | HR 53 | Ht 63.0 in | Wt 165.4 lb

## 2022-10-24 DIAGNOSIS — R410 Disorientation, unspecified: Secondary | ICD-10-CM | POA: Insufficient documentation

## 2022-10-24 DIAGNOSIS — J4489 Other specified chronic obstructive pulmonary disease: Secondary | ICD-10-CM | POA: Diagnosis not present

## 2022-10-24 DIAGNOSIS — I480 Paroxysmal atrial fibrillation: Secondary | ICD-10-CM

## 2022-10-24 DIAGNOSIS — Z79899 Other long term (current) drug therapy: Secondary | ICD-10-CM | POA: Diagnosis not present

## 2022-10-24 DIAGNOSIS — I110 Hypertensive heart disease with heart failure: Secondary | ICD-10-CM | POA: Diagnosis not present

## 2022-10-24 DIAGNOSIS — I4819 Other persistent atrial fibrillation: Secondary | ICD-10-CM

## 2022-10-24 DIAGNOSIS — N3000 Acute cystitis without hematuria: Secondary | ICD-10-CM | POA: Diagnosis not present

## 2022-10-24 DIAGNOSIS — K219 Gastro-esophageal reflux disease without esophagitis: Secondary | ICD-10-CM | POA: Insufficient documentation

## 2022-10-24 DIAGNOSIS — I10 Essential (primary) hypertension: Secondary | ICD-10-CM

## 2022-10-24 DIAGNOSIS — N390 Urinary tract infection, site not specified: Secondary | ICD-10-CM | POA: Insufficient documentation

## 2022-10-24 DIAGNOSIS — R0609 Other forms of dyspnea: Secondary | ICD-10-CM

## 2022-10-24 LAB — TSH: TSH: 2.337 u[IU]/mL (ref 0.350–4.500)

## 2022-10-24 MED ORDER — AMIODARONE HCL 200 MG PO TABS: ORAL_TABLET | ORAL | Status: DC

## 2022-10-24 NOTE — Addendum Note (Signed)
Encounter addended by: Learta Codding, CMA on: 10/24/2022 2:10 PM  Actions taken: Order list changed

## 2022-10-24 NOTE — Patient Instructions (Signed)
Medication Instructions:  Your physician recommends that you continue on your current medications as directed. Please refer to the Current Medication list given to you today.  *If you need a refill on your cardiac medications before your next appointment, please call your pharmacy*   Lab Work: NONE ordered at this time of appointment    Testing/Procedures: NONE ordered at this time of appointment     Follow-Up: At [facility], you and your health needs are our priority.  As part of our continuing mission to provide you with exceptional heart care, we have created designated Provider Care Teams.  These Care Teams include your primary Cardiologist (physician) and Advanced Practice Providers (APPs -  Physician Assistants and Nurse Practitioners) who all work together to provide you with the care you need, when you need it.  We recommend signing up for the patient portal called "MyChart".  Sign up information is provided on this After Visit Summary.  MyChart is used to connect with patients for Virtual Visits (Telemedicine).  Patients are able to view lab/test results, encounter notes, upcoming appointments, etc.  Non-urgent messages can be sent to your provider as well.   To learn more about what you can do with MyChart, go to [email or web address].    Your next appointment:    Keep follow up   Provider:   Little Ishikawa, MD

## 2022-10-24 NOTE — Progress Notes (Signed)
Office Visit    Patient Name: Bianca Wolf Date of Encounter: 10/24/2022  Primary Care Provider:  Merri Brunette, MD Primary Cardiologist:  Little Ishikawa, MD  Chief Complaint    82 year old female with a history of paroxysmal atrial fibrillation, hypertension, and GERD who presents for follow-up related to atrial fibrillation.  Past Medical History    Past Medical History:  Diagnosis Date   Atrial fibrillation (HCC)    GERD (gastroesophageal reflux disease)    Hypertension    Past Surgical History:  Procedure Laterality Date   BUBBLE STUDY  02/09/2022   Procedure: BUBBLE STUDY;  Surgeon: Parke Poisson, MD;  Location: [facility] ENDOSCOPY;  Service: Cardiovascular;;   CARDIOVERSION N/A 02/09/2022   Procedure: CARDIOVERSION;  Surgeon: Parke Poisson, MD;  Location: [facility] ENDOSCOPY;  Service: Cardiovascular;  Laterality: N/A;   CARDIOVERSION N/A 10/01/2022   Procedure: CARDIOVERSION;  Surgeon: Quintella Reichert, MD;  Location: [facility] INVASIVE CV LAB;  Service: Cardiovascular;  Laterality: N/A;   IR KYPHO LUMBAR INC FX REDUCE BONE BX UNI/BIL CANNULATION INC/IMAGING  09/21/2021   TEE WITHOUT CARDIOVERSION N/A 02/09/2022   Procedure: TRANSESOPHAGEAL ECHOCARDIOGRAM (TEE);  Surgeon: Parke Poisson, MD;  Location: [facility] ENDOSCOPY;  Service: Cardiovascular;  Laterality: N/A;    Allergies  No Known Allergies   Labs/Other Studies Reviewed    The following studies were reviewed today:  Cardiac Studies & Procedures       ECHOCARDIOGRAM  ECHOCARDIOGRAM COMPLETE 06/05/2022  Narrative ECHOCARDIOGRAM REPORT    Patient Name:   Bianca Wolf Date of Exam: 06/05/2022 Medical Rec #:  [id number]      Height:       64.0 in Accession #:    [id number]     Weight:       165.2 lb Date of Birth:  20-Apr-1941      BSA:          1.804 m Patient Age:    81 years       BP:           142/86 mmHg Patient Gender: F              HR:           71 bpm. Exam Location:  [address]  Procedure:  2D Echo, Cardiac Doppler and Color Doppler  Indications:    I50.9 CHF  History:        Patient has prior history of Echocardiogram examinations, most recent 02/07/2022. CHF, Arrythmias:Atrial Fibrillation and Palpitations; Risk Factors:Hypertension.  Sonographer:    Samule Ohm RDCS Referring Phys: [id number] CHRISTOPHER L SCHUMANN  IMPRESSIONS   1. Left ventricular ejection fraction, by estimation, is 65 to 70%. The left ventricle has normal function. The left ventricle has no regional wall motion abnormalities. There is moderate concentric left ventricular hypertrophy. Left ventricular diastolic parameters are consistent with Grade II diastolic dysfunction (pseudonormalization). 2. Right ventricular systolic function is normal. The right ventricular size is normal. There is moderately elevated pulmonary artery systolic pressure. 3. Left atrial size was severely dilated. 4. Right atrial size was mildly dilated. 5. The mitral valve is normal in structure. Trivial mitral valve regurgitation. No evidence of mitral stenosis. 6. The aortic valve is tricuspid. Aortic valve regurgitation is not visualized. No aortic stenosis is present. 7. The inferior vena cava is normal in size with greater than 50% respiratory variability, suggesting right atrial pressure of 3 mmHg.  FINDINGS Left Ventricle: Left ventricular ejection fraction, by estimation, is 65  to 70%. The left ventricle has normal function. The left ventricle has no regional wall motion abnormalities. The left ventricular internal cavity size was normal in size. There is moderate concentric left ventricular hypertrophy. Left ventricular diastolic parameters are consistent with Grade II diastolic dysfunction (pseudonormalization).  Right Ventricle: The right ventricular size is normal. No increase in right ventricular wall thickness. Right ventricular systolic function is normal. There is moderately elevated pulmonary artery systolic  pressure. The tricuspid regurgitant velocity is 3.25 m/s, and with an assumed right atrial pressure of 3 mmHg, the estimated right ventricular systolic pressure is 45.2 mmHg.  Left Atrium: Left atrial size was severely dilated.  Right Atrium: Right atrial size was mildly dilated.  Pericardium: There is no evidence of pericardial effusion.  Mitral Valve: The mitral valve is normal in structure. Trivial mitral valve regurgitation. No evidence of mitral valve stenosis.  Tricuspid Valve: The tricuspid valve is normal in structure. Tricuspid valve regurgitation is mild . No evidence of tricuspid stenosis.  Aortic Valve: The aortic valve is tricuspid. Aortic valve regurgitation is not visualized. No aortic stenosis is present.  Pulmonic Valve: The pulmonic valve was normal in structure. Pulmonic valve regurgitation is not visualized. No evidence of pulmonic stenosis.  Aorta: The aortic root is normal in size and structure.  Venous: The inferior vena cava is normal in size with greater than 50% respiratory variability, suggesting right atrial pressure of 3 mmHg.  IAS/Shunts: No atrial level shunt detected by color flow Doppler.   LEFT VENTRICLE PLAX 2D LVIDd:         4.70 cm   Diastology LVIDs:         3.10 cm   LV e' medial:    5.55 cm/s LV PW:         1.50 cm   LV E/e' medial:  20.0 LV IVS:        1.50 cm   LV e' lateral:   12.10 cm/s LVOT diam:     1.80 cm   LV E/e' lateral: 9.2 LV SV:         76 LV SV Index:   42 LVOT Area:     2.54 cm   RIGHT VENTRICLE             IVC RV S prime:     20.30 cm/s  IVC diam: 2.00 cm TAPSE (M-mode): 1.9 cm RVSP:           45.2 mmHg  LEFT ATRIUM             Index        RIGHT ATRIUM           Index LA diam:        4.60 cm 2.55 cm/m   RA Pressure: 3.00 mmHg LA Vol (A2C):   81.3 ml 45.07 ml/m  RA Area:     17.80 cm LA Vol (A4C):   65.6 ml 36.37 ml/m  RA Volume:   54.90 ml  30.44 ml/m LA Biplane Vol: 77.8 ml 43.13 ml/m AORTIC VALVE LVOT  Vmax:   141.00 cm/s LVOT Vmean:  90.700 cm/s LVOT VTI:    0.300 m  AORTA Ao Root diam: 3.00 cm Ao Asc diam:  3.10 cm  MITRAL VALVE                TRICUSPID VALVE MV Area (PHT): 2.84 cm     TR Peak grad:   42.2 mmHg MV Decel Time: 267 msec  TR Vmax:        325.00 cm/s MV E velocity: 111.00 cm/s  Estimated RAP:  3.00 mmHg MV A velocity: 93.30 cm/s   RVSP:           45.2 mmHg MV E/A ratio:  1.19 SHUNTS Systemic VTI:  0.30 m Systemic Diam: 1.80 cm  Chilton Si MD Electronically signed by Chilton Si MD Signature Date/Time: 06/05/2022/7:39:37 PM    Final   TEE  ECHO TEE 02/09/2022  Narrative TRANSESOPHOGEAL ECHO REPORT    Patient Name:   Bianca Wolf Date of Exam: 02/09/2022 Medical Rec #:  [id number]      Height:       64.0 in Accession #:    [id number]     Weight:       166.0 lb Date of Birth:  [birth year]      BSA:          1.807 m Patient Age:    81 years       BP:           138/78 mmHg Patient Gender: F              HR:           89 bpm. Exam Location:  Inpatient  Procedure: Cardiac Doppler, Color Doppler, Transesophageal Echo and Saline Contrast Bubble Study  Indications:     I48.91* Unspeicified atrial fibrillation  History:         Patient has prior history of Echocardiogram examinations, most recent 02/07/2022. Arrythmias:Atrial Fibrillation; Risk Factors:Hypertension and GERD.  Sonographer:     Eulah Pont RDCS Referring Phys:  [id number] Cyndi Bender Diagnosing Phys: Weston Brass MD  PROCEDURE: After discussion of the risks and benefits of a TEE, an informed consent was obtained from the patient. TEE procedure time was 17 minutes. The transesophogeal probe was passed without difficulty through the esophogus of the patient. Imaged were obtained with the patient in a left lateral decubitus position. Local oropharyngeal anesthetic was provided with Cetacaine. Sedation performed by different physician. The patient was monitored while under deep  sedation. Anesthestetic sedation was provided intravenously by Anesthesiology: 578.4mg  of Propofol. Image quality was good. The patient's vital signs; including heart rate, blood pressure, and oxygen saturation; remained stable throughout the procedure. The patient developed no complications during the procedure. A successful direct current cardioversion was performed at 120 joules with 1 attempt.  IMPRESSIONS   1. Left ventricular ejection fraction, by estimation, is 50%. The left ventricle has low normal function. 2. Right ventricular systolic function is mildly reduced. The right ventricular size is mildly enlarged. 3. Left atrial size was moderately dilated. No left atrial/left atrial appendage thrombus was detected. The LAA emptying velocity was 30 cm/s. 4. Right atrial size was moderately dilated. 5. A small pericardial effusion is present. 6. The mitral valve is grossly normal. Trivial mitral valve regurgitation. 7. Tricuspid valve regurgitation is moderate. 8. The aortic valve is grossly normal. Aortic valve regurgitation is not visualized. 9. 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 10. Agitated saline contrast bubble study was negative, with no evidence of any interatrial shunt.  FINDINGS Left Ventricle: Left ventricular ejection fraction, by estimation, is 50%. The left ventricle has low normal function. The left ventricular internal cavity size was normal in size.  Right Ventricle: Prominent moderator band. The right ventricular size is mildly enlarged. No increase in right ventricular wall thickness. Right ventricular systolic function is mildly reduced.  Left Atrium: Left atrial size was moderately dilated. No left atrial/left atrial appendage thrombus was detected. The LAA emptying velocity was 30 cm/s.  Right Atrium: Right atrial size was moderately dilated.  Pericardium: A small pericardial effusion is present.  Mitral  Valve: The mitral valve is grossly normal. Trivial mitral valve regurgitation.  Tricuspid Valve: The tricuspid valve is grossly normal. Tricuspid valve regurgitation is moderate.  Aortic Valve: The aortic valve is grossly normal. Aortic valve regurgitation is not visualized.  Pulmonic Valve: The pulmonic valve was normal in structure. Pulmonic valve regurgitation is trivial.  Aorta: The aortic root and ascending aorta are structurally normal, with no evidence of dilitation. There is severe (Grade IV) atheroma plaque involving the aortic arch and descending aorta.  IAS/Shunts: No atrial level shunt detected by color flow Doppler. Agitated saline contrast was given intravenously to evaluate for intracardiac shunting. Agitated saline contrast bubble study was negative, with no evidence of any interatrial shunt.  Additional Comments: Spectral Doppler performed.   LEFT VENTRICLE PLAX 2D LVOT diam:     1.80 cm LVOT Area:     2.54 cm    AORTA Ao Root diam: 2.80 cm Ao Asc diam:  3.00 cm  TRICUSPID VALVE TR Peak grad:   27.9 mmHg TR Vmax:        264.00 cm/s  SHUNTS Systemic Diam: 1.80 cm  Weston Brass MD Electronically signed by Weston Brass MD Signature Date/Time: 02/09/2022/2:34:45 PM    Final           Recent Labs: 02/10/2022: Magnesium 1.8 05/14/2022: BNP 219.0 10/15/2022: ALT 8; BUN 10; Creatinine, Ser 0.74; Hemoglobin 11.9; Platelets 250; Potassium 3.7; Sodium 145 10/24/2022: TSH 2.337  Recent Lipid Panel No results found for: "CHOL", "TRIG", "HDL", "CHOLHDL", "VLDL", "LDLCALC", "LDLDIRECT"  History of Present Illness    82 year old female with with the above past medical history including paroxysmal atrial fibrillation, hypertension, and GERD.  She was hospitalized in October 2023 in the setting of new onset atrial fibrillation with RVR.  Echocardiogram at that time showed EF 45 to 50%, mild RV systolic dysfunction.  She was started on Eliquis and metoprolol.  She underwent successful TEE/DCCV on 02/09/2022.  Echocardiogram in February 2024 showed EF improved to 65 to 70%, moderate LVH, G2 DD, normal RV function, moderately elevated pulmonary pressures, severe left atrial enlargement, mild right atrial enlargement.  She has followed in the A-fib clinic and is now on maintenance dose amiodarone.  She was last seen in the office on 10/15/2022 and noted acute confusion, dyspnea on exertion, generalized fatigue.  She noted missed doses of Eliquis in the evenings.  BP was elevated.  CT of the head was ordered and is pending.  Chest x-ray was generally unremarkable but did show evidence of mild COPD and chronic bronchitis.  UA showed evidence of possible UTI.  She was also evaluated at urgent care and started on antibiotic therapy.  She was seen in the A-fib clinic on 10/24/2022 and was noted to be bradycardic, HR 47.  Metoprolol was decreased.  She presents today for follow-up accompanied by her daughter. Since her last visit she has been stable overall from a cardiac standpoint.  She notes that she just received the cultures from her urinalysis and her antibiotic was switched today as a result.  She does have some ongoing mild confusion.  Her CT of the head is scheduled for this Friday.  She also notes some dyspnea on exertion over the past 2 weeks.  She has  mild nonpitting bilateral lower extremity edema on exam (pt denies), denies PND, orthopnea, weight gain.  She has not taken her Lasix recently.  She denies any chest pain, palpitations, dizziness, presyncope or syncope.  Other than her mild dyspnea and ongoing mild confusion, she denies any additional concerns today.  Home Medications    Current Outpatient Medications  Medication Sig Dispense Refill   acetaminophen (TYLENOL) 325 MG tablet Take 2 tablets (650 mg total) by mouth every 6 (six) hours as needed. 30 tablet 0   amoxicillin (AMOXIL) 500 MG tablet Take 1 tablet (500 mg total) by mouth 2 (two) times daily for  14 days. 28 tablet 0   apixaban (ELIQUIS) 5 MG TABS tablet Take 1 tablet (5 mg total) by mouth 2 (two) times daily. 60 tablet 1   benazepril (LOTENSIN) 20 MG tablet Take 20 mg by mouth in the morning.     Calcium Citrate-Vitamin D (CITRACAL + D PO) Take 1 tablet by mouth every morning.     cholecalciferol (VITAMIN D3) 25 MCG (1000 UNIT) tablet Take 1,000 Units by mouth in the morning.     furosemide (LASIX) 20 MG tablet Take 1 tablet (20 mg total) by mouth daily as needed (for weight increase of 3 lbs overnight or 5 lbs in 1 week). 30 tablet 3   metoprolol succinate (TOPROL-XL) 25 MG 24 hr tablet Take 2 tablets (50 mg total) by mouth daily. Take with or immediately following a meal. 270 tablet 1   nitrofurantoin, macrocrystal-monohydrate, (MACROBID) 100 MG capsule Take 100 mg by mouth every 12 (twelve) hours.     omeprazole (PRILOSEC) 20 MG capsule Take 20 mg by mouth daily before breakfast.     amiodarone (PACERONE) 200 MG tablet Take one tablet by mouth once daily     No current facility-administered medications for this visit.     Review of Systems    She denies chest pain, palpitations, pnd, orthopnea, n, v, dizziness, syncope, edema, weight gain, or early satiety. All other systems reviewed and are otherwise negative except as noted above.   Physical Exam    VS:  BP (!) 118/52 (BP Location: Left Arm, Patient Position: Sitting, Cuff Size: Normal)   Pulse (!) 53   Ht 5\' 3"  (1.6 m)   Wt 165 lb 6.4 oz (75 kg)   SpO2 94%   BMI 29.30 kg/m   GEN: Well nourished, well developed, in no acute distress. HEENT: normal. Neck: Supple, no JVD, carotid bruits, or masses. Cardiac: RRR, no murmurs, rubs, or gallops. No clubbing, cyanosis, nonpitting bilateral lower extremity edema.  Radials/DP/PT 2+ and equal bilaterally.  Respiratory:  Respirations regular and unlabored, clear to auscultation bilaterally. GI: Soft, nontender, nondistended, BS + x 4. MS: no deformity or atrophy. Skin: warm and  dry, no rash. Neuro:  Strength and sensation are intact. Psych: Normal affect.  Accessory Clinical Findings    ECG personally reviewed by me today -    - no acute changes.   Lab Results  Component Value Date   WBC 6.6 10/15/2022   HGB 11.9 10/15/2022   HCT 37.7 10/15/2022   MCV 90 10/15/2022   PLT 250 10/15/2022   Lab Results  Component Value Date   CREATININE 0.74 10/15/2022   BUN 10 10/15/2022   NA 145 (H) 10/15/2022   K 3.7 10/15/2022   CL 100 10/15/2022   CO2 31 (H) 10/15/2022   Lab Results  Component Value Date   ALT 8 10/15/2022   AST  14 10/15/2022   ALKPHOS 77 10/15/2022   BILITOT 1.0 10/15/2022   No results found for: "CHOL", "HDL", "LDLCALC", "LDLDIRECT", "TRIG", "CHOLHDL"  No results found for: "HGBA1C"  Assessment & Plan    1. Paroxysmal atrial fibrillation/dyspnea on exertion: S/p successful TEE/DCCV on 02/09/2022.  Echocardiogram in February 2024 showed EF improved to 65 to 70%, moderate LVH, G2 DD, normal RV function, moderately elevated pulmonary pressures, severe left atrial enlargement, mild right atrial enlargement.  She follows in the A-fib clinic, on maintenance dose amiodarone.  She saw one of the PAs in the A-fib clinic today, EKG showed sinus bradycardia, HR 47 bpm. TSH was normal. Metoprolol was decreased.  She denies any palpitations, dizziness, presyncope or syncope.  She reports adherence to Eliquis.  She does note some mild dyspnea on exertion.  She has mild nonpitting bilateral lower extremity edema, generally euvolemic and well compensated on exam.  Denies any chest pain. Recent echo reassuring. I advised her to consider taking her Lasix for a few days to see if this helps her dyspnea. Chest x-ray on 10/15/2022 did show evidence of mild COPD and chronic bronchitis.  If dyspnea persists, consider need for ischemic evaluation versus possible pulmonology referral.  Reviewed ED precautions. Continue metoprolol per A-fib clinic, Eliquis.  2.  Hypertension: BP well controlled. Continue current antihypertensive regimen.   3. Acute confusion/UTI: She continues to note mild confusion.  No obvious neurodeficits.  She is positive for UTI.  Started on Macrobid.  Antibiotic was switched today given CNS results.  She is pending CT head.  Follow-up with PCP.    4. Disposition: Follow-up as scheduled with Dr. Bjorn Pippin.       Joylene Grapes, NP 10/24/2022, 2:21 PM

## 2022-10-24 NOTE — Progress Notes (Signed)
Patient is here today for repeat ECG after having started amiodarone. She already began taking amiodarone 200 mg daily last week. She is unsure if taking 3 tablets of Toprol or 2 tablets. Daughter with her today will confirm at home. If taking 3 tablets (75 mg), advised to take 2 tablets (50 mg) as originally directed. If taking 2 tablets (50 mg), advised to decrease and take Toprol 25 mg daily.   TSH drawn today.   ECG today shows  Vent. rate 47 BPM PR interval 160 ms QRS duration 86 ms QT/QTcB 466/412 ms P-R-T axes 27 51 61 Sinus bradycardia Otherwise normal ECG When compared with ECG of 15-Oct-2022 08:34, PREVIOUS ECG IS PRESENT   Pt will follow up with Dr. Lalla Brothers as scheduled. F/u Afib clinic in November.

## 2022-10-26 ENCOUNTER — Telehealth: Payer: Self-pay

## 2022-10-26 ENCOUNTER — Ambulatory Visit
Admission: RE | Admit: 2022-10-26 | Discharge: 2022-10-26 | Disposition: A | Discharge status: Home / Self Care | Payer: Medicare HMO | Source: Ambulatory Visit | Attending: Adult Health | Admitting: Adult Health

## 2022-10-26 ENCOUNTER — Telehealth (HOSPITAL_COMMUNITY): Payer: Self-pay | Admitting: *Deleted

## 2022-10-26 DIAGNOSIS — I10 Essential (primary) hypertension: Secondary | ICD-10-CM | POA: Diagnosis not present

## 2022-10-26 DIAGNOSIS — N3000 Acute cystitis without hematuria: Secondary | ICD-10-CM

## 2022-10-26 DIAGNOSIS — I480 Paroxysmal atrial fibrillation: Secondary | ICD-10-CM | POA: Insufficient documentation

## 2022-10-26 DIAGNOSIS — R410 Disorientation, unspecified: Secondary | ICD-10-CM | POA: Diagnosis not present

## 2022-10-26 DIAGNOSIS — I509 Heart failure, unspecified: Secondary | ICD-10-CM | POA: Diagnosis not present

## 2022-10-26 MED ORDER — IOHEXOL 300 MG/ML  SOLN
75.0000 mL | Freq: Once | INTRAMUSCULAR | Status: AC | PRN
  Administered 2022-10-26: 75 mL via INTRAVENOUS

## 2022-10-26 NOTE — Telephone Encounter (Addendum)
Patients daughter Carley Hammed returned call. Daughter had understanding of results.----- Message from Jodelle Gross, NP sent at 10/19/2022  5:26 PM EDT ----- No evidence of CHF.  Only changes associated with COPD. Continue to follow up with PCP.  KL

## 2022-10-26 NOTE — Telephone Encounter (Addendum)
Patients daughter Carley Hammed returned call . Patients daughter had understanding of results.----- Message from Jodelle Gross, NP sent at 10/16/2022  7:04 AM EDT ----- Reviewed her labs. She has the beginnings of a UTI. Bacteria is not present yet.  Some blood in her urine. She is not anemic. Sodium is a little elevated, needs to take in more fluids. Vitamin D is okay.  Awaiting culture of urine for verification of growth.  Awaiting CXR and CT scan. Will wait for full results before treating her. KL

## 2022-10-26 NOTE — Telephone Encounter (Addendum)
Called patient regarding results. Unable to leave message for patient.----- Message from Jodelle Gross, NP sent at 10/19/2022  5:26 PM EDT ----- No evidence of CHF.  Only changes associated with COPD. Continue to follow up with PCP.  KL

## 2022-10-26 NOTE — Telephone Encounter (Signed)
Pt daughter called to update pt was still taking 75mg  of metoprolol due to following instructions on bottle - will decrease metoprolol to 50mg  daily (bottle updated).

## 2022-10-27 ENCOUNTER — Ambulatory Visit (HOSPITAL_COMMUNITY): Payer: Medicare HMO

## 2022-10-31 ENCOUNTER — Ambulatory Visit: Payer: Medicare HMO

## 2022-11-02 ENCOUNTER — Ambulatory Visit (HOSPITAL_COMMUNITY): Payer: Medicare HMO

## 2022-11-05 ENCOUNTER — Telehealth: Payer: Self-pay

## 2022-11-05 DIAGNOSIS — J45909 Unspecified asthma, uncomplicated: Secondary | ICD-10-CM | POA: Diagnosis not present

## 2022-11-05 DIAGNOSIS — E0590 Thyrotoxicosis, unspecified without thyrotoxic crisis or storm: Secondary | ICD-10-CM | POA: Diagnosis not present

## 2022-11-05 DIAGNOSIS — D509 Iron deficiency anemia, unspecified: Secondary | ICD-10-CM | POA: Diagnosis not present

## 2022-11-05 DIAGNOSIS — Z0000 Encounter for general adult medical examination without abnormal findings: Secondary | ICD-10-CM | POA: Diagnosis not present

## 2022-11-05 DIAGNOSIS — J309 Allergic rhinitis, unspecified: Secondary | ICD-10-CM | POA: Diagnosis not present

## 2022-11-05 DIAGNOSIS — I4819 Other persistent atrial fibrillation: Secondary | ICD-10-CM | POA: Diagnosis not present

## 2022-11-05 DIAGNOSIS — M810 Age-related osteoporosis without current pathological fracture: Secondary | ICD-10-CM | POA: Diagnosis not present

## 2022-11-05 DIAGNOSIS — R413 Other amnesia: Secondary | ICD-10-CM | POA: Diagnosis not present

## 2022-11-05 DIAGNOSIS — I10 Essential (primary) hypertension: Secondary | ICD-10-CM | POA: Diagnosis not present

## 2022-11-05 DIAGNOSIS — R3129 Other microscopic hematuria: Secondary | ICD-10-CM | POA: Diagnosis not present

## 2022-11-05 DIAGNOSIS — E559 Vitamin D deficiency, unspecified: Secondary | ICD-10-CM | POA: Diagnosis not present

## 2022-11-05 NOTE — Telephone Encounter (Addendum)
Called patient regarding results. Left message for patient to call office.----- Message from Joni Reining sent at 11/04/2022  2:14 PM EDT ----- CT the head is normal. No issues seen for changes in her memory or evidence of prior strokes or areas that are concerning.  Reassuring report.   KL

## 2022-11-05 NOTE — Telephone Encounter (Addendum)
Patients daughter called regarding results. Patients daughter had understanding of results.----- Message from Joni Reining sent at 11/04/2022  2:14 PM EDT ----- CT the head is normal. No issues seen for changes in her memory or evidence of prior strokes or areas that are concerning.  Reassuring report.   KL

## 2022-11-05 NOTE — Telephone Encounter (Addendum)
Patient returned call regarding results. Patient had understanding of results.----- Message from Joni Reining sent at 11/04/2022  2:14 PM EDT ----- CT the head is normal. No issues seen for changes in her memory or evidence of prior strokes or areas that are concerning.  Reassuring report.   KL

## 2022-11-07 NOTE — Progress Notes (Signed)
Electrophysiology Office Note:    Date:  11/08/2022   ID:  Bianca Wolf, Bianca Wolf 1940/08/19, MRN [id number]  [facility] Cardiologist:  Little Ishikawa, MD  [facility] Electrophysiologist:  Lanier Prude, MD   Referring MD: Merri Brunette, MD   Chief Complaint: Atrial fibrillation  History of Present Illness:    Bianca Wolf is a 82 y.o. femalewho I am seeing today for an evaluation of atrial fibrillation at the request of Charlann Lange.  The patient was last seen by Jonny Ruiz on October 10, 2022.  The patient has a medical history that includes atrial fibrillation (diagnosed February 06, 2022), GERD, hypertension.  She did have a cardioversion on October 01, 2022.  At the appointment with Jonny Ruiz, she was in sinus rhythm.  At the last appointment with Jonny Ruiz, she started amiodarone and she was referred to discuss left atrial appendage occlusion.  She has forgotten multiple doses of her anticoagulation which led to the conversation regarding watchman implant.  She is with her daughter today in clinic.  She continues to intermittently miss doses of Eliquis.        Their past medical, social and family history was reveiwed.   ROS:   Please see the history of present illness.    All other systems reviewed and are negative.  EKGs/Labs/Other Studies Reviewed:    The following studies were reviewed today:  June 05, 2022 echo EF 65% RV function normal Dilated left and right atrium Trivial MR       Physical Exam:    VS:  BP (!) 179/70   Pulse 60   Ht 5\' 3"  (1.6 m)   Wt 166 lb 6.4 oz (75.5 kg)   SpO2 94%   BMI 29.48 kg/m     Wt Readings from Last 3 Encounters:  11/08/22 166 lb 6.4 oz (75.5 kg)  10/24/22 165 lb 6.4 oz (75 kg)  10/15/22 168 lb 6.4 oz (76.4 kg)     GEN:  Well nourished, well developed in no acute distress CARDIAC: RRR, no murmurs, rubs, gallops RESPIRATORY:  Clear to auscultation without rales, wheezing or rhonchi       ASSESSMENT AND PLAN:     1. Persistent atrial fibrillation (HCC)   2. Confusion and disorientation   3. Primary hypertension   4. Encounter for long-term (current) use of high-risk medication     #Persistent atrial fibrillation #Medication regimen noncompliance #High risk med monitoring-amiodarone The patient has atrial fibrillation and has required cardioversion in the past. She is symptomatic while in atrial fibrillation.  She has been started on amiodarone. Continue amiodarone 200 mg by mouth once daily Continue Eliquis for now until watchman implanted  ------------------  I have seen Bianca Wolf in the office today who is being considered for a Watchman left atrial appendage closure device. I believe they will benefit from this procedure given their history of atrial fibrillation, CHA2DS2-VASc score of 4. Unfortunately, the patient is not felt to be a long term anticoagulation candidate secondary to medication regimen noncompliance. The patient's chart has been reviewed and I feel that they would be a candidate for short term oral anticoagulation after Watchman implant.   It is my belief that after undergoing a LAA closure procedure, Bianca Wolf will not need long term anticoagulation which eliminates anticoagulation side effects and major bleeding risk.   Procedural risks for the Watchman implant have been reviewed with the patient including a 0.5% risk of stroke, <1% risk of perforation and <  1% risk of device embolization. Other risks include bleeding, vascular damage, tamponade, worsening renal function, and death. The patient understands these risk and wishes to proceed.     The published clinical data on the safety and effectiveness of WATCHMAN include but are not limited to the following: - Holmes DR, Everlene Farrier, Sick P et al. for the PROTECT AF Investigators. Percutaneous closure of the left atrial appendage versus warfarin therapy for prevention of stroke in patients with atrial fibrillation: a  randomised non-inferiority trial. Lancet 2009; 374: 534-42. Everlene Farrier, Doshi SK, Isa Rankin D et al. on behalf of the PROTECT AF Investigators. Percutaneous Left Atrial Appendage Closure for Stroke Prophylaxis in Patients With Atrial Fibrillation 2.3-Year Follow-up of the PROTECT AF (Watchman Left Atrial Appendage System for Embolic Protection in Patients With Atrial Fibrillation) Trial. Circulation 2013; 127:720-729. - Alli O, Doshi S,  Kar S, Reddy VY, Sievert H et al. Quality of Life Assessment in the Randomized PROTECT AF (Percutaneous Closure of the Left Atrial Appendage Versus Warfarin Therapy for Prevention of Stroke in Patients With Atrial Fibrillation) Trial of Patients at Risk for Stroke With Nonvalvular Atrial Fibrillation. J Am Coll Cardiol 2013; 61:1790-8. Aline August DR, Mia [place], Price M, Whisenant B, Sievert H, Doshi S, Huber K, Reddy V. Prospective randomized evaluation of the Watchman left atrial appendage Device in patients with atrial fibrillation versus long-term warfarin therapy; the PREVAIL trial. Journal of the [employer] of Cardiology, Vol. 4, No. 1, 2014, 1-11. - Kar S, Doshi SK, Sadhu A, Horton R, Osorio J et al. Primary outcome evaluation of a next-generation left atrial appendage closure device: results from the PINNACLE FLX trial. Circulation 2021;143(18)1754-1762.    After today's visit with the patient which was dedicated solely for shared decision making visit regarding LAA closure device, the patient decided to proceed with the LAA appendage closure procedure scheduled to be done in the near future at [facility]. Prior to the procedure, I would like to obtain a gated CT scan of the chest with contrast timed for PV/LA visualization.    HAS-BLED score 2 Hypertension Yes  Abnormal renal and liver function (Dialysis, transplant, Cr >2.26 mg/dL /Cirrhosis or Bilirubin >2x Normal or AST/ALT/AP >3x Normal) No  Stroke No  Bleeding No  Labile INR  (Unstable/high INR) No  Elderly (>65) Yes  Drugs or alcohol (? 8 drinks/week, anti-plt or NSAID) No   CHA2DS2-VASc Score = 4  The patient's score is based upon: CHF History: 0 HTN History: 1 Diabetes History: 0 Stroke History: 0 Vascular Disease History: 0 Age Score: 2 Gender Score: 1    Signed, Sheria Lang T. Lalla Brothers, MD, [facility], [facility] 11/08/2022 3:31 PM    Electrophysiology [facility]

## 2022-11-08 ENCOUNTER — Ambulatory Visit: Payer: Medicare HMO | Attending: Cardiology | Admitting: Cardiology

## 2022-11-08 ENCOUNTER — Encounter: Payer: Self-pay | Admitting: Cardiology

## 2022-11-08 VITALS — BP 179/70 | HR 60 | Ht 63.0 in | Wt 166.4 lb

## 2022-11-08 DIAGNOSIS — I4819 Other persistent atrial fibrillation: Secondary | ICD-10-CM | POA: Diagnosis not present

## 2022-11-08 DIAGNOSIS — Z79899 Other long term (current) drug therapy: Secondary | ICD-10-CM

## 2022-11-08 DIAGNOSIS — I10 Essential (primary) hypertension: Secondary | ICD-10-CM | POA: Diagnosis not present

## 2022-11-08 DIAGNOSIS — R410 Disorientation, unspecified: Secondary | ICD-10-CM

## 2022-11-08 NOTE — Patient Instructions (Signed)
Medication Instructions:  Your physician recommends that you continue on your current medications as directed. Please refer to the Current Medication list given to you today.  *If you need a refill on your cardiac medications before your next appointment, please call your pharmacy*  Lab Work: TODAY: BMET  Testing/Procedures: Your physician has requested that you have cardiac CT. Cardiac computed tomography (CT) is a painless test that uses an x-ray machine to take clear, detailed pictures of your heart. For further information please visit [email or web address] Please follow instruction sheet as given.  Follow-Up: At [facility], you and your health needs are our priority.  As part of our continuing mission to provide you with exceptional heart care, we have created designated Provider Care Teams.  These Care Teams include your primary Cardiologist (physician) and Advanced Practice Providers (APPs -  Physician Assistants and Nurse Practitioners) who all work together to provide you with the care you need, when you need it.  Your next appointment:   After testing is completed you will be contacted by Nurse Navigator, Karsten Fells to schedule your pre-procedure visit and procedure date. If you have any questions she can be reached at [phone].

## 2022-11-09 LAB — BASIC METABOLIC PANEL
BUN/Creatinine Ratio: 16 (ref 12–28)
BUN: 16 mg/dL (ref 8–27)
CO2: 28 mmol/L (ref 20–29)
Calcium: 9.7 mg/dL (ref 8.7–10.3)
Chloride: 99 mmol/L (ref 96–106)
Creatinine, Ser: 1.03 mg/dL — ABNORMAL HIGH (ref 0.57–1.00)
Glucose: 94 mg/dL (ref 70–99)
Potassium: 4.8 mmol/L (ref 3.5–5.2)
Sodium: 140 mmol/L (ref 134–144)
eGFR: 54 mL/min/{1.73_m2} — ABNORMAL LOW (ref >59)

## 2022-11-19 NOTE — Progress Notes (Unsigned)
Cardiology Office Note:    Date:  11/20/2022   ID:  Amayrani, Nicoli 1940/06/23, MRN [id number]  PCP:  Merri Brunette, MD  Cardiologist:  Little Ishikawa, MD  Electrophysiologist:  Lanier Prude, MD   Referring MD: Merri Brunette, MD   Chief Complaint  Patient presents with   Atrial Fibrillation    History of Present Illness:    Bianca Wolf is a 82 y.o. female with a hx of persistent atrial fibrillation, hypertension who presents for follow-up.  She was admitted 02/06/2022 with palpitations, found to have new onset atrial fibrillation with RVR.  Echocardiogram showed EF 45 to 50%, mild RV systolic dysfunction.  She was started on Eliquis and metoprolol.  Underwent successful TEE/DCCV on 02/09/2022.  Echocardiogram 06/05/2022 showed EF 65 to 70%, moderate LVH, grade 2 diastolic dysfunction, normal RV function, moderately elevated pulmonary pressures, severe left atrial enlargement, mild right atrial enlargement.  Since last clinic visit, she reports she is doing okay.  Does report some dyspnea but denies any chest pain.  She denies any lightheadedness, syncope, lower extremity edema.  She has not had to take any Lasix recently.  She is taking Eliquis, denies any bleeding issues.    Past Medical History:  Diagnosis Date   Atrial fibrillation (HCC)    GERD (gastroesophageal reflux disease)    Hypertension     Past Surgical History:  Procedure Laterality Date   BUBBLE STUDY  02/09/2022   Procedure: BUBBLE STUDY;  Surgeon: Parke Poisson, MD;  Location: [facility] ENDOSCOPY;  Service: Cardiovascular;;   CARDIOVERSION N/A 02/09/2022   Procedure: CARDIOVERSION;  Surgeon: Parke Poisson, MD;  [facility];  Service: Cardiovascular;  Laterality: N/A;   CARDIOVERSION N/A 10/01/2022   Procedure: CARDIOVERSION;  Surgeon: Quintella Reichert, MD;  Location: [facility] INVASIVE CV LAB;  Service: Cardiovascular;  Laterality: N/A;   IR KYPHO LUMBAR INC FX REDUCE BONE BX UNI/BIL  CANNULATION INC/IMAGING  09/21/2021   TEE WITHOUT CARDIOVERSION N/A 02/09/2022   Procedure: TRANSESOPHAGEAL ECHOCARDIOGRAM (TEE);  Surgeon: Parke Poisson, MD;  Location: [facility] ENDOSCOPY;  Service: Cardiovascular;  Laterality: N/A;    Current Medications: Current Meds  Medication Sig   acetaminophen (TYLENOL) 325 MG tablet Take 2 tablets (650 mg total) by mouth every 6 (six) hours as needed.   amiodarone (PACERONE) 200 MG tablet Take one tablet by mouth once daily   apixaban (ELIQUIS) 5 MG TABS tablet Take 1 tablet (5 mg total) by mouth 2 (two) times daily.   benazepril (LOTENSIN) 20 MG tablet Take 20 mg by mouth in the morning.   Calcium Citrate-Vitamin D (CITRACAL + D PO) Take 1 tablet by mouth every morning.   cholecalciferol (VITAMIN D3) 25 MCG (1000 UNIT) tablet Take 1,000 Units by mouth in the morning.   cyanocobalamin 2000 MCG tablet Take 2,000 mcg by mouth daily.   furosemide (LASIX) 20 MG tablet Take 1 tablet (20 mg total) by mouth daily as needed (for weight increase of 3 lbs overnight or 5 lbs in 1 week).   nitrofurantoin, macrocrystal-monohydrate, (MACROBID) 100 MG capsule Take 100 mg by mouth every 12 (twelve) hours.   omeprazole (PRILOSEC) 20 MG capsule Take 20 mg by mouth daily before breakfast.   [DISCONTINUED] metoprolol succinate (TOPROL-XL) 25 MG 24 hr tablet Take 2 tablets (50 mg total) by mouth daily. Take with or immediately following a meal.     Allergies:   Patient has no known allergies.   Social History   Socioeconomic  History   Marital status: Widowed    Spouse name: Not on file   Number of children: Not on file   Years of education: Not on file   Highest education level: Not on file  Occupational History   Not on file  Tobacco Use   Smoking status: Former    Current packs/day: 0.00    Average packs/day: 1 pack/day for 25.0 years (25.0 ttl pk-yrs)    Types: Cigarettes    Start date: 25    Quit date: 79    Years since quitting: 44.6   Smokeless  tobacco: Never  Vaping Use   Vaping status: Never Used  Substance and Sexual Activity   Alcohol use: No   Drug use: No   Sexual activity: Yes    Birth control/protection: None  Other Topics Concern   Not on file  Social History Narrative   Not on file   Social Determinants of Health   Financial Resource Strain: Not on file  Food Insecurity: Not on file  Transportation Needs: Not on file  Physical Activity: Not on file  Stress: Not on file  Social Connections: Not on file     Family History: The patient's family history includes Diabetes in her mother; Heart disease in her father; Hypertension in her father and mother. There is no history of Atrial fibrillation.  ROS:   Please see the history of present illness.     All other systems reviewed and are negative.  EKGs/Labs/Other Studies Reviewed:    The following studies were reviewed today:   EKG:   08/23/22: Afib, rate 101 05/14/2022: Sinus rhythm with PACs, rate 60 02/15/22: Sinus bradycardia, rate 54, no ST abnormalities  Recent Labs: 02/10/2022: Magnesium 1.8 05/14/2022: BNP 219.0 10/15/2022: ALT 8; Hemoglobin 11.9; Platelets 250 10/24/2022: TSH 2.337 11/08/2022: BUN 16; Creatinine, Ser 1.03; Potassium 4.8; Sodium 140  Recent Lipid Panel No results found for: "CHOL", "TRIG", "HDL", "CHOLHDL", "VLDL", "LDLCALC", "LDLDIRECT"  Physical Exam:    VS:  BP (!) 153/70 (BP Location: Left Arm, Patient Position: Sitting, Cuff Size: Normal)   Pulse (!) 52   Ht 5\' 3"  (1.6 m)   Wt 166 lb 12.8 oz (75.7 kg)   SpO2 96%   BMI 29.55 kg/m     Wt Readings from Last 3 Encounters:  11/20/22 166 lb 12.8 oz (75.7 kg)  11/08/22 166 lb 6.4 oz (75.5 kg)  10/24/22 165 lb 6.4 oz (75 kg)     GEN:  in no acute distress HEENT: Normal NECK: No JVD; No carotid bruits CARDIAC: bradycardic, regular, no murmurs, rubs, gallops RESPIRATORY:  Clear to auscultation without rales, wheezing or rhonchi  ABDOMEN: Soft, non-tender,  non-distended MUSCULOSKELETAL:  trace edema; No deformity  SKIN: Warm and dry NEUROLOGIC:  Alert and oriented x 3 PSYCHIATRIC:  Normal affect   ASSESSMENT:    1. Persistent atrial fibrillation (HCC)   2. Chronic systolic heart failure (HCC)   3. Lower extremity edema   4. Essential hypertension      PLAN:    Persistent atrial fibrillation: She was admitted 02/06/2022 with palpitations, found to have new onset atrial fibrillation with RVR.  Echocardiogram showed EF 45 to 50%, mild RV systolic dysfunction.  She was started on Eliquis and metoprolol.  Underwent successful TEE/DCCV on 02/09/2022.  CHA2DS2-VASc score 5 (CHF, hypertension, age x2, female).  At follow-up visit in 08/2022 she was back in atrial fibrillation.  Referred to A-fib clinic and started on amiodarone, converted to sinus rhythm -Currently  on Toprol-XL 25 mg daily.  Previously she felt significant fatigue on 50 mg daily, dose reduced to 25 mg daily -Continue amiodarone 200 mg daily, appears to be maintaining sinus rhythm.  Normal LFTs, TSH 09/2022 -Has had difficulty with compliance with Eliquis.  Follows with Dr. Lalla Brothers, planning Watchman  Systolic heart failure: EF 45 to 50% on echo 02/07/2022.  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  Echocardiogram 06/05/2022 showed EF 65 to 70%, moderate LVH, grade 2 diastolic dysfunction, normal RV function, moderately elevated pulmonary pressures, severe left atrial enlargement, mild right atrial enlargement. -Continue Toprol-XL 25 mg daily -Continue benazepril 20 mg daily  Lower extremity edema:  On Lasix 20 mg daily as needed.  Appears improved, has not had to use Lasix recently  Hypertension: Continue Toprol-XL 25 mg daily and benazepril 20 mg daily.  Appears controlled  Daytime somnolence: Recommended Itamar sleep study but patient did not have done.  STOPBANG 4   RTC in 6 months   Medication Adjustments/Labs and Tests Ordered: Current medicines are reviewed at length  with the patient today.  Concerns regarding medicines are outlined above.  No orders of the defined types were placed in this encounter.  Meds ordered this encounter  Medications   metoprolol succinate (TOPROL-XL) 25 MG 24 hr tablet    Sig: Take 1 tablet (25 mg total) by mouth daily. Take with or immediately following a meal.    Dispense:  90 tablet    Refill:  3    Dose increase please fill    Patient Instructions  Medication Instructions:  Metoprolol Succinate 25 mg daily *If you need a refill on your cardiac medications before your next appointment, please call your pharmacy*  Follow-Up: At [facility], you and your health needs are our priority.  As part of our continuing mission to provide you with exceptional heart care, we have created designated Provider Care Teams.  These Care Teams include your primary Cardiologist (physician) and Advanced Practice Providers (APPs -  Physician Assistants and Nurse Practitioners) who all work together to provide you with the care you need, when you need it.  We recommend signing up for the patient portal called "MyChart".  Sign up information is provided on this After Visit Summary.  MyChart is used to connect with patients for Virtual Visits (Telemedicine).  Patients are able to view lab/test results, encounter notes, upcoming appointments, etc.  Non-urgent messages can be sent to your provider as well.   To learn more about what you can do with MyChart, go to [email or web address].    Your next appointment:   6 month(s)  Provider:   Little Ishikawa, MD       Signed, Little Ishikawa, MD  11/20/2022 5:56 PM    [facility]

## 2022-11-20 ENCOUNTER — Ambulatory Visit: Payer: Medicare HMO | Attending: Cardiology | Admitting: Cardiology

## 2022-11-20 VITALS — BP 153/70 | HR 52 | Ht 63.0 in | Wt 166.8 lb

## 2022-11-20 DIAGNOSIS — R600 Localized edema: Secondary | ICD-10-CM | POA: Diagnosis not present

## 2022-11-20 DIAGNOSIS — I4819 Other persistent atrial fibrillation: Secondary | ICD-10-CM | POA: Diagnosis not present

## 2022-11-20 DIAGNOSIS — I10 Essential (primary) hypertension: Secondary | ICD-10-CM | POA: Diagnosis not present

## 2022-11-20 DIAGNOSIS — I5022 Chronic systolic (congestive) heart failure: Secondary | ICD-10-CM

## 2022-11-20 MED ORDER — METOPROLOL SUCCINATE ER 25 MG PO TB24: 25.0000 mg | ORAL_TABLET | Freq: Every day | ORAL | 3 refills | Status: DC

## 2022-11-20 NOTE — Patient Instructions (Signed)
Medication Instructions:  Metoprolol Succinate 25 mg daily *If you need a refill on your cardiac medications before your next appointment, please call your pharmacy*  Follow-Up: At [facility], you and your health needs are our priority.  As part of our continuing mission to provide you with exceptional heart care, we have created designated Provider Care Teams.  These Care Teams include your primary Cardiologist (physician) and Advanced Practice Providers (APPs -  Physician Assistants and Nurse Practitioners) who all work together to provide you with the care you need, when you need it.  We recommend signing up for the patient portal called "MyChart".  Sign up information is provided on this After Visit Summary.  MyChart is used to connect with patients for Virtual Visits (Telemedicine).  Patients are able to view lab/test results, encounter notes, upcoming appointments, etc.  Non-urgent messages can be sent to your provider as well.   To learn more about what you can do with MyChart, go to [email or web address].    Your next appointment:   6 month(s)  Provider:   Little Ishikawa, MD

## 2022-11-23 ENCOUNTER — Telehealth (HOSPITAL_COMMUNITY): Payer: Self-pay | Admitting: *Deleted

## 2022-11-23 NOTE — Telephone Encounter (Signed)
Attempted to call patient regarding upcoming cardiac CT appointment. °Left message on voicemail with name and callback number ° °  RN Navigator Cardiac Imaging °[facility] °[phone] Office °[phone] Cell ° °

## 2022-11-26 ENCOUNTER — Ambulatory Visit (HOSPITAL_COMMUNITY)
Admission: RE | Admit: 2022-11-26 | Discharge: 2022-11-26 | Disposition: A | Discharge status: Home / Self Care | Payer: Medicare HMO | Source: Ambulatory Visit | Attending: Cardiology | Admitting: Cardiology

## 2022-11-26 DIAGNOSIS — I10 Essential (primary) hypertension: Secondary | ICD-10-CM | POA: Diagnosis not present

## 2022-11-26 DIAGNOSIS — Z79899 Other long term (current) drug therapy: Secondary | ICD-10-CM | POA: Insufficient documentation

## 2022-11-26 DIAGNOSIS — I4819 Other persistent atrial fibrillation: Secondary | ICD-10-CM | POA: Diagnosis not present

## 2022-11-26 DIAGNOSIS — R410 Disorientation, unspecified: Secondary | ICD-10-CM | POA: Insufficient documentation

## 2022-11-26 MED ORDER — IOHEXOL 350 MG/ML SOLN
95.0000 mL | Freq: Once | INTRAVENOUS | Status: AC | PRN
  Administered 2022-11-26: 95 mL via INTRAVENOUS

## 2022-11-27 DIAGNOSIS — R319 Hematuria, unspecified: Secondary | ICD-10-CM | POA: Diagnosis not present

## 2022-11-27 DIAGNOSIS — N3021 Other chronic cystitis with hematuria: Secondary | ICD-10-CM | POA: Diagnosis not present

## 2022-11-28 ENCOUNTER — Telehealth: Payer: Self-pay

## 2022-12-17 DIAGNOSIS — K5730 Diverticulosis of large intestine without perforation or abscess without bleeding: Secondary | ICD-10-CM | POA: Diagnosis not present

## 2022-12-17 DIAGNOSIS — R3129 Other microscopic hematuria: Secondary | ICD-10-CM | POA: Diagnosis not present

## 2022-12-17 DIAGNOSIS — R319 Hematuria, unspecified: Secondary | ICD-10-CM | POA: Diagnosis not present

## 2022-12-18 ENCOUNTER — Telehealth: Payer: Self-pay | Admitting: Cardiology

## 2022-12-18 ENCOUNTER — Other Ambulatory Visit: Payer: Self-pay

## 2022-12-18 DIAGNOSIS — I4819 Other persistent atrial fibrillation: Secondary | ICD-10-CM

## 2022-12-18 NOTE — Telephone Encounter (Signed)
The patient wishes to proceed with LAAO on 03/14/2023. Her 02/27/2023 office visit in the AFib Clinic will serve as her pre-procedure visit. She was grateful for call and agreed with plan.

## 2022-12-18 NOTE — Telephone Encounter (Signed)
  Pt said she is returning Bianca Wolf's call regarding watchman consult with Dr. Lalla Brothers

## 2022-12-18 NOTE — Telephone Encounter (Signed)
-----   Message from Rossie [facility] sent at 11/30/2022  7:28 PM EDT ----- OK to continue with watchman evaluation.  Sheria Lang T. Lalla Brothers, MD, [facility], [facility] Cardiac Electrophysiology

## 2023-01-10 ENCOUNTER — Telehealth: Payer: Self-pay

## 2023-01-21 DIAGNOSIS — N952 Postmenopausal atrophic vaginitis: Secondary | ICD-10-CM | POA: Diagnosis not present

## 2023-01-21 DIAGNOSIS — N3021 Other chronic cystitis with hematuria: Secondary | ICD-10-CM | POA: Diagnosis not present

## 2023-01-27 ENCOUNTER — Other Ambulatory Visit (HOSPITAL_COMMUNITY): Payer: Self-pay | Admitting: Internal Medicine

## 2023-02-07 ENCOUNTER — Telehealth: Payer: Self-pay

## 2023-02-07 NOTE — Telephone Encounter (Signed)
Due to cancellation, rescheduled the patient for Watchman procedure to 02/14/2023. Scheduled her for pre-procedure visit 02/08/2023. She was grateful for call and agreed with plan.   Confirmed with Precert department that procedure has been approved.

## 2023-02-08 ENCOUNTER — Other Ambulatory Visit: Payer: Self-pay

## 2023-02-08 ENCOUNTER — Ambulatory Visit: Payer: Medicare HMO | Attending: Internal Medicine | Admitting: Physician Assistant

## 2023-02-08 VITALS — BP 130/70 | HR 60 | Ht 63.0 in | Wt 171.6 lb

## 2023-02-08 DIAGNOSIS — I10 Essential (primary) hypertension: Secondary | ICD-10-CM

## 2023-02-08 DIAGNOSIS — I429 Cardiomyopathy, unspecified: Secondary | ICD-10-CM | POA: Diagnosis not present

## 2023-02-08 DIAGNOSIS — I4819 Other persistent atrial fibrillation: Secondary | ICD-10-CM

## 2023-02-08 DIAGNOSIS — D6869 Other thrombophilia: Secondary | ICD-10-CM

## 2023-02-08 DIAGNOSIS — Z91148 Patient's other noncompliance with medication regimen for other reason: Secondary | ICD-10-CM | POA: Diagnosis not present

## 2023-02-08 DIAGNOSIS — I480 Paroxysmal atrial fibrillation: Secondary | ICD-10-CM | POA: Diagnosis not present

## 2023-02-08 NOTE — Progress Notes (Signed)
[facility]   MULTIDISCIPLINARY HEART VALVE CLINIC                                     Cardiology Office Note:    Date:  02/08/2023   ID:  [name], DOB Feb 10, 1941, MRN [id number]  PCP:  Merri Brunette, MD  [facility] Cardiologist:  Little Ishikawa, MD  [facility] Electrophysiologist:  Lanier Prude, MD   Referring MD: Merri Brunette, MD   Pre LAAO visit - scheduled 10/24  History of Present Illness:    Bianca Wolf is a 82 y.o. female with a hx of HTN, GERD, and PAF (diagnosed in 10/203) and s/p DCCV in 09/2022 on amiodarone and Eliquis who presents to clinic for follow up.   She was admitted 02/06/2022 with palpitations, found to have new onset atrial fibrillation with RVR. Echocardiogram showed EF 45 to 50%, mild RV systolic dysfunction. She was started on Eliquis and metoprolol. Underwent successful TEE/DCCV on 02/09/2022. Echocardiogram 06/05/2022 showed EF 65 to 70%, moderate LVH, grade 2 diastolic dysfunction, normal RV function, moderately elevated pulmonary pressures, severe left atrial enlargement, mild right atrial enlargement. She was placed on OAC with Eliquis. She has forgotten multiple doses of her anticoagulation which led to the conversation regarding Watchman implant and referral to Dr. Lalla Brothers.  Pre Watchman cardiac CT showed the left atrial appendage is a large chicken wing morphology without thrombus and suitable for a 27 mm Watchman FLX device. Also noted to have a moderately elevated CAC score of 256, which is 63rd percentile for age-, sex-, and race-matched controls.  Today the patient presents to clinic for follow up. Here alone. No CP. She does have chronic mild SOB with exertion that is unchanged. No LE edema, orthopnea or PND. No dizziness or syncope. No blood in stool or urine. No palpitations.      Past Medical History:  Diagnosis Date   Atrial fibrillation (HCC)    GERD (gastroesophageal reflux disease)     Hypertension     Past Surgical History:  Procedure Laterality Date   BUBBLE STUDY  02/09/2022   Procedure: BUBBLE STUDY;  Surgeon: Parke Poisson, MD;  Location: [facility] ENDOSCOPY;  Service: Cardiovascular;;   CARDIOVERSION N/A 02/09/2022   Procedure: CARDIOVERSION;  Surgeon: Parke Poisson, MD;  Location: [facility] ENDOSCOPY;  Service: Cardiovascular;  Laterality: N/A;   CARDIOVERSION N/A 10/01/2022   Procedure: CARDIOVERSION;  Surgeon: Quintella Reichert, MD;  Location: [facility] INVASIVE CV LAB;  Service: Cardiovascular;  Laterality: N/A;   IR KYPHO LUMBAR INC FX REDUCE BONE BX UNI/BIL CANNULATION INC/IMAGING  09/21/2021   TEE WITHOUT CARDIOVERSION N/A 02/09/2022   Procedure: TRANSESOPHAGEAL ECHOCARDIOGRAM (TEE);  Surgeon: Parke Poisson, MD;  Location: [facility] ENDOSCOPY;  Service: Cardiovascular;  Laterality: N/A;    Current Medications: Current Meds  Medication Sig   acetaminophen (TYLENOL) 325 MG tablet Take 2 tablets (650 mg total) by mouth every 6 (six) hours as needed.   amiodarone (PACERONE) 200 MG tablet Take 1 tablet (200 mg total) by mouth daily. TAKE 1 TABLET BY MOUTH TWICE DAILY FOR 14 DAYS THEN  ONE  TABLET  ONCE  DAILY (Patient taking differently: Take 200 mg by mouth daily.)   apixaban (ELIQUIS) 5 MG TABS tablet Take 1 tablet (5 mg total) by mouth 2 (two) times daily.   benazepril (LOTENSIN) 20 MG tablet Take 10 mg by mouth  in the morning.   Calcium Citrate-Vitamin D (CITRACAL + D PO) Take 1 tablet by mouth every morning.   cyanocobalamin 2000 MCG tablet Take 2,000 mcg by mouth daily.   furosemide (LASIX) 20 MG tablet Take 1 tablet (20 mg total) by mouth daily as needed (for weight increase of 3 lbs overnight or 5 lbs in 1 week).   metoprolol succinate (TOPROL-XL) 25 MG 24 hr tablet Take 1 tablet (25 mg total) by mouth daily. Take with or immediately following a meal.   omeprazole (PRILOSEC) 20 MG capsule Take 20 mg by mouth daily before breakfast.   [DISCONTINUED] cholecalciferol  (VITAMIN D3) 25 MCG (1000 UNIT) tablet Take 1,000 Units by mouth in the morning.   [DISCONTINUED] nitrofurantoin, macrocrystal-monohydrate, (MACROBID) 100 MG capsule Take 100 mg by mouth every 12 (twelve) hours.      ROS:   Please see the history of present illness.    All other systems reviewed and are negative.  EKGs   EKG:  EKG is ordered today.  The ekg ordered today demonstrates NSR, septal Q waves. HR 60  Recent Labs: 02/10/2022: Magnesium 1.8 05/14/2022: BNP 219.0 10/15/2022: ALT 8; Hemoglobin 11.9; Platelets 250 10/24/2022: TSH 2.337 11/08/2022: BUN 16; Creatinine, Ser 1.03; Potassium 4.8; Sodium 140  Recent Lipid Panel No results found for: "CHOL", "TRIG", "HDL", "CHOLHDL", "VLDL", "LDLCALC", "LDLDIRECT"   Risk Assessment/Calculations:    HAS-BLED score 2 Hypertension Yes  Abnormal renal and liver function (Dialysis, transplant, Cr >2.26 mg/dL /Cirrhosis or Bilirubin >2x Normal or AST/ALT/AP >3x Normal) No  Stroke No  Bleeding No  Labile INR (Unstable/high INR) No  Elderly (>65) Yes  Drugs or alcohol (>= 8 drinks/week, anti-plt or NSAID) No    CHA2DS2-VASc Score = 4  The patient's score is based upon: CHF History: 0 HTN History: 1 Diabetes History: 0 Stroke History: 0 Vascular Disease History: 0 Age Score: 2 Gender Score: 1   Physical Exam:    VS:  BP 130/70   Pulse 60   Ht 5\' 3"  (1.6 m)   Wt 171 lb 9.6 oz (77.8 kg)   SpO2 95%   BMI 30.40 kg/m     Wt Readings from Last 3 Encounters:  02/08/23 171 lb 9.6 oz (77.8 kg)  11/20/22 166 lb 12.8 oz (75.7 kg)  11/08/22 166 lb 6.4 oz (75.5 kg)     GEN: Well nourished, well developed in no acute distress NECK: No JVD; No carotid bruits CARDIAC: RRR, no murmurs, rubs, gallops RESPIRATORY:  Clear to auscultation without rales, wheezing or rhonchi  ABDOMEN: Soft, non-tender, non-distended EXTREMITIES:  No edema; No deformity   ASSESSMENT:    1. Paroxysmal atrial fibrillation (HCC)   2. Hypercoagulable state  due to persistent atrial fibrillation (HCC)   3. Poor compliance with medication   4. Essential hypertension   5. Cardiomyopathy, unspecified type (HCC)    PLAN:    In order of problems listed above:  PAF with poor medical compliance: set up for LAAO with Watchman on 02/14/23 with Dr. Lalla Brothers. BMET/CBC/ECG today. Instruction letter and soap given to pt. Continue on amiodarone 200mg  daily and Eliquis 5mg  BID. Post implant strategy to be determined the day of surgery.  Moderately elevated CAC score:noted on pre planning CT. CAC score of 256, which is 63rd percentile for age-, sex-, and race-matched controls. Discussed statin therapy but pt declined any new medications.    HTN: Bp elevated today but improved to 130/70 on my personal recheck. Continue Toprol XL 25 mg  daily.     Cardiomyopathy: EF down to 45% in the setting of aifb with RVR. Likely tachy mediated. Improved to normal with restoration of sinus. No s/s CHF. NHYA class I.   Medication Adjustments/Labs and Tests Ordered: Current medicines are reviewed at length with the patient today.  Concerns regarding medicines are outlined above.  Orders Placed This Encounter  Procedures   CBC   Basic metabolic panel   EKG 12-Lead   No orders of the defined types were placed in this encounter.   Patient Instructions  Medication Instructions:  Your physician recommends that you continue on your current medications as directed. Please refer to the Current Medication list given to you today.  *If you need a refill on your cardiac medications before your next appointment, please call your pharmacy*   Lab Work: BMET, CBC - TODAY   If you have labs (blood work) drawn today and your tests are completely normal, you will receive your results only by: MyChart Message (if you have MyChart) OR A paper copy in the mail If you have any lab test that is abnormal or we need to change your treatment, we will call you to review the  results.   Testing/Procedures: You are scheduled for a Left Atrial Appendage Device Implantation on Thursday, February 14, 2023 with Dr. Steffanie Dunn.    Follow-Up: Your follow up appointment will be scheduled during your hospital stay   Other Instructions     Signed, Cline Crock, PA-C  02/08/2023 2:03 PM    [facility]

## 2023-02-08 NOTE — Patient Instructions (Signed)
Medication Instructions:  Your physician recommends that you continue on your current medications as directed. Please refer to the Current Medication list given to you today.  *If you need a refill on your cardiac medications before your next appointment, please call your pharmacy*   Lab Work: BMET, CBC - TODAY   If you have labs (blood work) drawn today and your tests are completely normal, you will receive your results only by: MyChart Message (if you have MyChart) OR A paper copy in the mail If you have any lab test that is abnormal or we need to change your treatment, we will call you to review the results.   Testing/Procedures: You are scheduled for a Left Atrial Appendage Device Implantation on Thursday, February 14, 2023 with Dr. Steffanie Dunn.    Follow-Up: Your follow up appointment will be scheduled during your hospital stay   Other Instructions

## 2023-02-09 LAB — BASIC METABOLIC PANEL
BUN/Creatinine Ratio: 11 — ABNORMAL LOW (ref 12–28)
BUN: 9 mg/dL (ref 8–27)
CO2: 30 mmol/L — ABNORMAL HIGH (ref 20–29)
Calcium: 9.3 mg/dL (ref 8.7–10.3)
Chloride: 101 mmol/L (ref 96–106)
Creatinine, Ser: 0.83 mg/dL (ref 0.57–1.00)
Glucose: 75 mg/dL (ref 70–99)
Potassium: 3.9 mmol/L (ref 3.5–5.2)
Sodium: 142 mmol/L (ref 134–144)
eGFR: 70 mL/min/{1.73_m2} (ref >59)

## 2023-02-09 LAB — CBC
Hematocrit: 39.2 % (ref 34.0–46.6)
Hemoglobin: 12 g/dL (ref 11.1–15.9)
MCH: 28.2 pg (ref 26.6–33.0)
MCHC: 30.6 g/dL — ABNORMAL LOW (ref 31.5–35.7)
MCV: 92 fL (ref 79–97)
Platelets: 220 10*3/uL (ref 150–450)
RBC: 4.25 x10E6/uL (ref 3.77–5.28)
RDW: 13 % (ref 11.7–15.4)
WBC: 6.8 10*3/uL (ref 3.4–10.8)

## 2023-02-13 ENCOUNTER — Telehealth: Payer: Self-pay

## 2023-02-13 NOTE — Telephone Encounter (Signed)
Confirmed procedure date of 02/13/2023. Confirmed arrival time of 0700 for procedure time at 0930. Reviewed pre-procedure and medication instructions with patient. The patient understands to call if questions/concerns arise prior to procedure.  She was grateful for call and agreed with plan.

## 2023-02-13 NOTE — Telephone Encounter (Signed)
Called to confirm Watchman instructions for tomorrow and ensure the patient has no questions or concerns.  Left message to call back.

## 2023-02-14 ENCOUNTER — Other Ambulatory Visit: Payer: Self-pay

## 2023-02-14 ENCOUNTER — Encounter (HOSPITAL_COMMUNITY): Payer: Self-pay | Admitting: Cardiology

## 2023-02-14 ENCOUNTER — Inpatient Hospital Stay (HOSPITAL_COMMUNITY): Payer: Medicare HMO | Admitting: Certified Registered"

## 2023-02-14 ENCOUNTER — Inpatient Hospital Stay (HOSPITAL_COMMUNITY)
Admission: RE | Admit: 2023-02-14 | Discharge: 2023-02-14 | DRG: 274 | Disposition: A | Discharge status: Home / Self Care | Payer: Medicare HMO | Attending: Cardiology | Admitting: Cardiology

## 2023-02-14 ENCOUNTER — Encounter (HOSPITAL_COMMUNITY)
Admission: RE | Disposition: A | Discharge status: Home / Self Care | Payer: Self-pay | Source: Home / Self Care | Attending: Cardiology

## 2023-02-14 ENCOUNTER — Inpatient Hospital Stay (HOSPITAL_COMMUNITY): Payer: Medicare HMO

## 2023-02-14 ENCOUNTER — Inpatient Hospital Stay (HOSPITAL_COMMUNITY): Payer: Medicare HMO | Admitting: Vascular Surgery

## 2023-02-14 DIAGNOSIS — I5022 Chronic systolic (congestive) heart failure: Secondary | ICD-10-CM | POA: Insufficient documentation

## 2023-02-14 DIAGNOSIS — E785 Hyperlipidemia, unspecified: Secondary | ICD-10-CM | POA: Diagnosis present

## 2023-02-14 DIAGNOSIS — Z95818 Presence of other cardiac implants and grafts: Secondary | ICD-10-CM

## 2023-02-14 DIAGNOSIS — Z006 Encounter for examination for normal comparison and control in clinical research program: Secondary | ICD-10-CM | POA: Diagnosis not present

## 2023-02-14 DIAGNOSIS — S2241XA Multiple fractures of ribs, right side, initial encounter for closed fracture: Secondary | ICD-10-CM | POA: Diagnosis not present

## 2023-02-14 DIAGNOSIS — I4891 Unspecified atrial fibrillation: Secondary | ICD-10-CM | POA: Diagnosis present

## 2023-02-14 DIAGNOSIS — I480 Paroxysmal atrial fibrillation: Secondary | ICD-10-CM | POA: Diagnosis present

## 2023-02-14 DIAGNOSIS — I358 Other nonrheumatic aortic valve disorders: Secondary | ICD-10-CM | POA: Diagnosis present

## 2023-02-14 DIAGNOSIS — I4819 Other persistent atrial fibrillation: Principal | ICD-10-CM | POA: Diagnosis present

## 2023-02-14 DIAGNOSIS — Z91199 Patient's noncompliance with other medical treatment and regimen due to unspecified reason: Secondary | ICD-10-CM | POA: Diagnosis not present

## 2023-02-14 DIAGNOSIS — K219 Gastro-esophageal reflux disease without esophagitis: Secondary | ICD-10-CM | POA: Diagnosis present

## 2023-02-14 DIAGNOSIS — I517 Cardiomegaly: Secondary | ICD-10-CM | POA: Diagnosis not present

## 2023-02-14 DIAGNOSIS — Z79899 Other long term (current) drug therapy: Secondary | ICD-10-CM | POA: Diagnosis not present

## 2023-02-14 DIAGNOSIS — I10 Essential (primary) hypertension: Secondary | ICD-10-CM | POA: Diagnosis present

## 2023-02-14 DIAGNOSIS — Z87891 Personal history of nicotine dependence: Secondary | ICD-10-CM | POA: Diagnosis not present

## 2023-02-14 DIAGNOSIS — R410 Disorientation, unspecified: Secondary | ICD-10-CM | POA: Diagnosis present

## 2023-02-14 DIAGNOSIS — Z01818 Encounter for other preprocedural examination: Secondary | ICD-10-CM | POA: Diagnosis not present

## 2023-02-14 HISTORY — PX: TEE WITHOUT CARDIOVERSION: SHX5443

## 2023-02-14 HISTORY — PX: LEFT ATRIAL APPENDAGE OCCLUSION: EP1229

## 2023-02-14 LAB — SURGICAL PCR SCREEN
MRSA, PCR: NEGATIVE
Staphylococcus aureus: NEGATIVE

## 2023-02-14 LAB — ECHO TEE

## 2023-02-14 LAB — POCT ACTIVATED CLOTTING TIME: Activated Clotting Time: 391 s

## 2023-02-14 LAB — TYPE AND SCREEN
ABO/RH(D): O POS
Antibody Screen: NEGATIVE

## 2023-02-14 LAB — GLUCOSE, CAPILLARY: Glucose-Capillary: 92 mg/dL (ref 70–99)

## 2023-02-14 LAB — ABO/RH: ABO/RH(D): O POS

## 2023-02-14 SURGERY — LEFT ATRIAL APPENDAGE OCCLUSION
Anesthesia: General

## 2023-02-14 MED ORDER — FENTANYL CITRATE (PF) 250 MCG/5ML IJ SOLN
INTRAMUSCULAR | Status: DC | PRN
  Administered 2023-02-14: 50 ug via INTRAVENOUS

## 2023-02-14 MED ORDER — BENAZEPRIL HCL 20 MG PO TABS
20.0000 mg | ORAL_TABLET | Freq: Once | ORAL | Status: DC
  Filled 2023-02-14: qty 1

## 2023-02-14 MED ORDER — CHLORHEXIDINE GLUCONATE 0.12 % MT SOLN
OROMUCOSAL | Status: AC
  Administered 2023-02-14: 15 mL via OROMUCOSAL
  Filled 2023-02-14: qty 15

## 2023-02-14 MED ORDER — EPHEDRINE SULFATE-NACL 50-0.9 MG/10ML-% IV SOSY
PREFILLED_SYRINGE | INTRAVENOUS | Status: DC | PRN
  Administered 2023-02-14: 10 mg via INTRAVENOUS
  Administered 2023-02-14 (×3): 5 mg via INTRAVENOUS

## 2023-02-14 MED ORDER — CEFAZOLIN SODIUM-DEXTROSE 2-4 GM/100ML-% IV SOLN
INTRAVENOUS | Status: AC
  Filled 2023-02-14: qty 100

## 2023-02-14 MED ORDER — LIDOCAINE 2% (20 MG/ML) 5 ML SYRINGE
INTRAMUSCULAR | Status: DC | PRN
  Administered 2023-02-14: 60 mg via INTRAVENOUS

## 2023-02-14 MED ORDER — LACTATED RINGERS IV SOLN: INTRAVENOUS | Status: DC | PRN

## 2023-02-14 MED ORDER — HEPARIN SODIUM (PORCINE) 1000 UNIT/ML IJ SOLN
INTRAMUSCULAR | Status: DC | PRN
Start: 2023-02-14 — End: 2023-02-14
  Administered 2023-02-14: 12000 [IU] via INTRAVENOUS

## 2023-02-14 MED ORDER — ACETAMINOPHEN 325 MG PO TABS: 650.0000 mg | ORAL_TABLET | ORAL | Status: DC | PRN

## 2023-02-14 MED ORDER — PROTAMINE SULFATE 10 MG/ML IV SOLN
INTRAVENOUS | Status: DC | PRN
  Administered 2023-02-14: 30 mg via INTRAVENOUS

## 2023-02-14 MED ORDER — IOHEXOL 350 MG/ML SOLN
INTRAVENOUS | Status: DC | PRN
  Administered 2023-02-14: 20 mL

## 2023-02-14 MED ORDER — ONDANSETRON HCL 4 MG/2ML IJ SOLN: 4.0000 mg | Freq: Four times a day (QID) | INTRAMUSCULAR | Status: DC | PRN

## 2023-02-14 MED ORDER — HEPARIN (PORCINE) IN NACL 2000-0.9 UNIT/L-% IV SOLN
INTRAVENOUS | Status: DC | PRN
  Administered 2023-02-14: 1000 mL

## 2023-02-14 MED ORDER — CHLORHEXIDINE GLUCONATE 4 % EX SOLN
Freq: Once | CUTANEOUS | Status: DC
  Filled 2023-02-14: qty 15

## 2023-02-14 MED ORDER — SODIUM CHLORIDE 0.9 % IV SOLN: INTRAVENOUS | Status: DC

## 2023-02-14 MED ORDER — ONDANSETRON HCL 4 MG/2ML IJ SOLN
INTRAMUSCULAR | Status: DC | PRN
  Administered 2023-02-14: 4 mg via INTRAVENOUS

## 2023-02-14 MED ORDER — PROPOFOL 10 MG/ML IV BOLUS
INTRAVENOUS | Status: DC | PRN
  Administered 2023-02-14: 110 mg via INTRAVENOUS
  Administered 2023-02-14: 30 mg via INTRAVENOUS

## 2023-02-14 MED ORDER — CHLORHEXIDINE GLUCONATE 0.12 % MT SOLN
15.0000 mL | Freq: Once | OROMUCOSAL | Status: AC
  Filled 2023-02-14: qty 15

## 2023-02-14 MED ORDER — PROPOFOL 500 MG/50ML IV EMUL
INTRAVENOUS | Status: DC | PRN
  Administered 2023-02-14: 125 ug/kg/min via INTRAVENOUS

## 2023-02-14 MED ORDER — CEFAZOLIN SODIUM-DEXTROSE 2-4 GM/100ML-% IV SOLN
2.0000 g | INTRAVENOUS | Status: AC
  Administered 2023-02-14: 2 g via INTRAVENOUS
  Filled 2023-02-14: qty 100

## 2023-02-14 MED ORDER — SUGAMMADEX SODIUM 200 MG/2ML IV SOLN
INTRAVENOUS | Status: DC | PRN
  Administered 2023-02-14: 200 mg via INTRAVENOUS

## 2023-02-14 MED ORDER — ROCURONIUM BROMIDE 10 MG/ML (PF) SYRINGE
PREFILLED_SYRINGE | INTRAVENOUS | Status: DC | PRN
  Administered 2023-02-14: 10 mg via INTRAVENOUS
  Administered 2023-02-14: 60 mg via INTRAVENOUS

## 2023-02-14 SURGICAL SUPPLY — 18 items
BLANKET WARM UNDERBOD FULL ACC (MISCELLANEOUS) ×1
CATH DIAG 6FR PIGTAIL ANGLED (CATHETERS) ×1
CLOSURE PERCLOSE PROSTYLE (VASCULAR PRODUCTS) ×2
DILATOR VESSEL 38 20CM 12FR (INTRODUCER) ×1
KIT HEART LEFT (KITS) ×1
KIT SHEA VERSACROSS LAAC CONNE (KITS) ×1
PACK CARDIAC CATHETERIZATION (CUSTOM PROCEDURE TRAY) ×1
PAD DEFIB RADIO PHYSIO CONN (PAD) ×1
SHEATH PERFORMER 16FR 30 (SHEATH) ×1
SHEATH PINNACLE 8F 10CM (SHEATH) ×1
SHEATH PROBE COVER 6X72 (BAG) ×1
SYS WATCHMAN FXD DBL (SHEATH) ×1
TRANSDUCER W/STOPCOCK (MISCELLANEOUS) ×1
TUBING ART PRESS 72 MALE/FEM (TUBING) ×2
TUBING CIL FLEX 10 FLL-RA (TUBING) ×1
WATCHMAN FLX PRO 24 (Prosthesis & Implant Heart) ×1 IMPLANT
WATCHMAN FLX PRO PROCEDURE (KITS) ×1
WATCHMAN FLX PROCEDURE DEVICE (KITS) ×1

## 2023-02-14 NOTE — Anesthesia Procedure Notes (Signed)
Procedure Name: Intubation Date/Time: 02/14/2023 10:03 AM  Performed by: Gus Puma, CRNAPre-anesthesia Checklist: Patient identified, Emergency Drugs available, Suction available and Patient being monitored Patient Re-evaluated:Patient Re-evaluated prior to induction Oxygen Delivery Method: Circle System Utilized Preoxygenation: Pre-oxygenation with 100% oxygen Induction Type: IV induction Ventilation: Mask ventilation without difficulty Laryngoscope Size: Mac and 3 Grade View: Grade I Tube type: Oral Tube size: 7.0 mm Number of attempts: 1 Airway Equipment and Method: Stylet and Oral airway Placement Confirmation: ETT inserted through vocal cords under direct vision, positive ETCO2 and breath sounds checked- equal and bilateral Secured at: 21 cm Tube secured with: Tape Dental Injury: Teeth and Oropharynx as per pre-operative assessment

## 2023-02-14 NOTE — Discharge Instructions (Signed)

## 2023-02-14 NOTE — Progress Notes (Signed)
Pt being d/c.  PIVs removed Discharge information given.  Pt verb understanding and had no further questions  

## 2023-02-14 NOTE — Discharge Summary (Signed)
[facility]    Patient ID: Bianca Wolf,  MRN: [id number], DOB/AGE: Jan 09, 1941 82 y.o.  Admit date: 02/14/2023 Discharge date: 02/14/2023  Primary Care Physician: Merri Brunette, MD  Primary Cardiologist: Little Ishikawa, MD  Electrophysiologist: Lanier Prude, MD  Primary Discharge Diagnosis:  Persistent Atrial Fibrillation Poor candidacy for long term anticoagulation due to refusal of long-term oral anticoagulation due to compliance issues  Procedures This Admission:  Transeptal Puncture Intra-procedural TEE which showed no LAA thrombus Left atrial appendage occlusive device placement on 02/14/23 by Dr. Lalla Brothers.   This study demonstrated:  CONCLUSIONS:  1.Successful implantation of a WATCHMAN left atrial appendage occlusive device    2. TEE demonstrating no LAA thrombus 3. No early apparent complications.    Post Implant Anticoagulation Strategy: Continue Eliquis 5mg  PO BID x 45 days. After 45 days, stop Eliquis and start Plavix 75mg  PO daily to complete 6 months of post implant therapy. Plan for CT scan after 60 days to assess appendage patency and Watchman position.    Brief HPI: Bianca Wolf is a 82 y.o. female with a history of HTN, GERD, and PAF (diagnosed in 10/203) and s/p DCCV in 09/2022 on amiodarone and Eliquis who presents to clinic for follow up.    She was admitted 02/06/2022 with palpitations, found to have new onset atrial fibrillation with RVR. Echocardiogram showed EF 45 to 50%, mild RV systolic dysfunction. She was started on Eliquis and metoprolol. Underwent successful TEE/DCCV on 02/09/2022. Echocardiogram 06/05/2022 showed EF 65 to 70%, moderate LVH, grade 2 diastolic dysfunction, normal RV function, moderately elevated pulmonary pressures, severe left atrial enlargement, mild right atrial enlargement. She was placed on OAC with Eliquis. She has forgotten multiple doses of her anticoagulation which led to the conversation  regarding Watchman implant and referral to Dr. Lalla Brothers.   Pre Watchman cardiac CT showed the left atrial appendage is a large chicken wing morphology without thrombus and suitable for a 27 mm Watchman FLX device. Also noted to have a moderately elevated CAC score of 256, which is 63rd percentile for age-, sex-, and race-matched controls.   Hospital Course:  The patient was admitted and underwent left atrial appendage occlusive device placement with 24mm Watchman FLX Pro device 02/14/23. She was monitored in the post procedure setting and has done very well with no concerns. Given this, she is being considered for same day discharge later today. Groin site has been stable without evidence of hematoma or bleeding. Wound care and restrictions were reviewed with the patient. The patient has been scheduled for post procedure follow up with Georgie Chard, NP in approximately 1 month. She will restart Eliquis later today and continue for 45 days (12/1) then stop. At that time, she will start Plavix 75mg  to complete 6 months of therapy (08/15/23). She will require dental SBE during this time which will be discussed and RXd at follow up. Post procedure instructions reviewed. She has ambulated without difficulty. Groin site remains stable. A repeat CT will be performed in approximately 60 days to ensure proper seal of the device.   Noted to be hypertensive immediately post procedure and was given home benazepril 20mg  with response (157/60). May require titration of antihypertensives at follow up.   Physical Exam: Vitals:   02/14/23 1500 02/14/23 1510 02/14/23 1520 02/14/23 1550  BP: (!) 153/72   (!) 150/55  Pulse: (!) 56 (!) 58 (!) 57 (!) 56  Resp: (!) 24 17 19 17   Temp:    Marland Kitchen)  97.4 F (36.3 C)  TempSrc:    Oral  SpO2: 94% (!) 88% 95% 93%  Weight:      Height:       Labs:   Lab Results  Component Value Date   WBC 6.8 02/08/2023   HGB 12.0 02/08/2023   HCT 39.2 02/08/2023   MCV 92 02/08/2023   PLT  220 02/08/2023    Recent Labs  Lab 02/08/23 0959  NA 142  K 3.9  CL 101  CO2 30*  BUN 9  CREATININE 0.83  CALCIUM 9.3  GLUCOSE 75     Discharge Medications:  Allergies as of 02/14/2023   No Known Allergies      Medication List     TAKE these medications    acetaminophen 325 MG tablet Commonly known as: Tylenol Take 2 tablets (650 mg total) by mouth every 6 (six) hours as needed.   amiodarone 200 MG tablet Commonly known as: PACERONE Take 1 tablet (200 mg total) by mouth daily. TAKE 1 TABLET BY MOUTH TWICE DAILY FOR 14 DAYS THEN  ONE  TABLET  ONCE  DAILY What changed: additional instructions   apixaban 5 MG Tabs tablet Commonly known as: ELIQUIS Take 1 tablet (5 mg total) by mouth 2 (two) times daily. Notes to patient: Restart Eliquis this evening, 02/14/23   benazepril 20 MG tablet Commonly known as: LOTENSIN Take 10 mg by mouth in the morning.   CITRACAL + D PO Take 1 tablet by mouth every morning.   cyanocobalamin 2000 MCG tablet Take 2,000 mcg by mouth daily.   furosemide 20 MG tablet Commonly known as: LASIX Take 1 tablet (20 mg total) by mouth daily as needed (for weight increase of 3 lbs overnight or 5 lbs in 1 week).   metoprolol succinate 25 MG 24 hr tablet Commonly known as: TOPROL-XL Take 1 tablet (25 mg total) by mouth daily. Take with or immediately following a meal.   omeprazole 20 MG capsule Commonly known as: PRILOSEC Take 20 mg by mouth daily before breakfast.        Disposition:  Home  Discharge Instructions     Call MD for:  redness, tenderness, or signs of infection (pain, swelling, redness, odor or green/yellow discharge around incision site)   Complete by: As directed    Call MD for:  severe uncontrolled pain   Complete by: As directed    Call MD for:  temperature >100.4   Complete by: As directed    Diet - low sodium heart healthy   Complete by: As directed    Increase activity slowly   Complete by: As directed         Follow-up Information     Filbert Schilder, NP Follow up on 03/18/2023.   Specialty: Cardiology Why: At 9:55AM. Please arrive by 9:40AM. Contact information: [address] [place] [address] [phone]                 Duration of Discharge Encounter: Greater than 30 minutes including physician time.  Signed, Georgie Chard, NP  02/14/2023 3:55 PM

## 2023-02-14 NOTE — Anesthesia Preprocedure Evaluation (Addendum)
Anesthesia Evaluation  Patient identified by MRN, date of birth, ID band Patient awake    Reviewed: Allergy & Precautions, NPO status , Patient's Chart, lab work & pertinent test results  History of Anesthesia Complications Negative for: history of anesthetic complications  Airway Mallampati: III  TM Distance: >3 FB Neck ROM: Full    Dental  (+) Dental Advisory Given   Pulmonary asthma , former smoker   breath sounds clear to auscultation       Cardiovascular hypertension, +CHF   Rhythm:Regular  1. Left ventricular ejection fraction, by estimation, is 65 to 70%. The  left ventricle has normal function. The left ventricle has no regional  wall motion abnormalities. There is moderate concentric left ventricular  hypertrophy. Left ventricular  diastolic parameters are consistent with Grade II diastolic dysfunction  (pseudonormalization).   2. Right ventricular systolic function is normal. The right ventricular  size is normal. There is moderately elevated pulmonary artery systolic  pressure.   3. Left atrial size was severely dilated.   4. Right atrial size was mildly dilated.   5. The mitral valve is normal in structure. Trivial mitral valve  regurgitation. No evidence of mitral stenosis.   6. The aortic valve is tricuspid. Aortic valve regurgitation is not  visualized. No aortic stenosis is present.   7. The inferior vena cava is normal in size with greater than 50%  respiratory variability, suggesting right atrial pressure of 3 mmHg.     Neuro/Psych negative neurological ROS  negative psych ROS   GI/Hepatic Neg liver ROS,GERD  ,,  Endo/Other    Renal/GU Renal diseaseLab Results      Component                Value               Date                      NA                       142                 02/08/2023                K                        3.9                 02/08/2023                CO2                       30 (H)              02/08/2023                GLUCOSE                  75                  02/08/2023                BUN                      9                   02/08/2023  CREATININE               0.83                02/08/2023                CALCIUM                  9.3                 02/08/2023                EGFR                     70                  02/08/2023                GFRNONAA                 >60                 09/20/2022                Musculoskeletal negative musculoskeletal ROS (+)    Abdominal   Peds  Hematology  (+) Blood dyscrasia Lab Results      Component                Value               Date                      WBC                      6.8                 02/08/2023                HGB                      12.0                02/08/2023                HCT                      39.2                02/08/2023                MCV                      92                  02/08/2023                PLT                      220                 02/08/2023             eliquis   Anesthesia Other Findings   Reproductive/Obstetrics                             Anesthesia Physical Anesthesia Plan  ASA: 2  Anesthesia Plan: General  Post-op Pain Management: Minimal or no pain anticipated   Induction: Intravenous  PONV Risk Score and Plan: 3 and Ondansetron, Dexamethasone, Propofol infusion and TIVA  Airway Management Planned: Oral ETT  Additional Equipment: None  Intra-op Plan:   Post-operative Plan: Extubation in OR  Informed Consent: I have reviewed the patients History and Physical, chart, labs and discussed the procedure including the risks, benefits and alternatives for the proposed anesthesia with the patient or authorized representative who has indicated his/her understanding and acceptance.     Dental advisory given  Plan Discussed with: CRNA  Anesthesia Plan Comments:         Anesthesia Quick  Evaluation

## 2023-02-14 NOTE — TOC Initial Note (Signed)
Transition of Care [facility]) - Initial/Assessment Note    Patient Details  Name: Bianca Wolf MRN: [id number] Date of Birth: 1940/11/27  Transition of Care [facility]) CM/SW Contact:    Leone Haven, RN Phone Number: 02/14/2023, 4:24 PM  Clinical Narrative:                  Per daughter at the bedside, From home alone, has PCP and insurance on file, states has no HH services in place at this time or DME at home.  States family member will transport them home at [employer] and family is support system, states gets medications from [facility] on [address].  Pta self ambulatory.  Expected Discharge Plan: Home/Self Care Barriers to Discharge: No Barriers Identified   Patient Goals and CMS Choice Patient states their goals for this hospitalization and ongoing recovery are:: return home   Choice offered to / list presented to : NA      Expected Discharge Plan and Services In-house Referral: NA Discharge Planning Services: CM Consult Post Acute Care Choice: NA Living arrangements for the past 2 months: Single Family Home Expected Discharge Date: 02/14/23               DME Arranged: N/A DME Agency: NA       HH Arranged: NA          Prior Living Arrangements/Services Living arrangements for the past 2 months: Single Family Home Lives with:: Self Patient language and need for interpreter reviewed:: Yes Do you feel safe going back to the place where you live?: Yes      Need for Family Participation in Patient Care: Yes (Comment) Care giver support system in place?: Yes (comment)   Criminal Activity/Legal Involvement Pertinent to Current Situation/Hospitalization: No - Comment as needed  Activities of Daily Living   ADL Screening (condition at time of admission) Independently performs ADLs?: Yes (appropriate for developmental age) Is the patient deaf or have difficulty hearing?: No Does the patient have difficulty seeing, even when wearing glasses/contacts?: No Does the patient  have difficulty concentrating, remembering, or making decisions?: No  Permission Sought/Granted Permission sought to share information with : Case Manager Permission granted to share information with : Yes, Verbal Permission Granted              Emotional Assessment       Orientation: : Oriented to Self, Oriented to Place, Oriented to  Time, Oriented to Situation Alcohol / Substance Use: Not Applicable Psych Involvement: No (comment)  Admission diagnosis:  Atrial fibrillation (HCC) [I48.91] Patient Active Problem List   Diagnosis Date Noted   Chronic systolic heart failure (HCC) 02/14/2023   Atrial fibrillation (HCC) 02/14/2023   Presence of Watchman left atrial appendage closure device 02/14/2023   Hypercoagulable state due to persistent atrial fibrillation (HCC) 09/07/2022   Atrial flutter (HCC) 09/07/2022   Acute systolic heart failure (HCC)    AKI (acute kidney injury) (HCC)    Persistent atrial fibrillation (HCC) 02/07/2022   Acute congestive heart failure (HCC)    Atrial fibrillation with RVR (HCC) 02/06/2022   Age-related osteoporosis without current pathological fracture 09/08/2021   Allergic rhinitis 09/08/2021   Essential hypertension 09/08/2021   Hypertriglyceridemia 09/08/2021   Gastroesophageal reflux disease 09/08/2021   Increased frequency of urination 09/08/2021   Overactive bladder 09/08/2021   Hyperthyroidism 09/08/2021   Unspecified asthma, uncomplicated 09/08/2021   Vitamin D deficiency 09/08/2021   History of vertebral compression fracture 09/07/2021   PCP:  Merri Brunette, MD  Pharmacy:   [facility] [address] [place] - [address] [place] [address] Phone: [phone] Fax: [phone]  [facility] [address] [place] [address] Phone: [phone] Fax: [phone]     Social Determinants of Health (SDOH) Social History: SDOH Screenings   Tobacco Use:  Medium Risk (02/14/2023)   SDOH Interventions:     Readmission Risk Interventions     No data to display

## 2023-02-14 NOTE — TOC Transition Note (Signed)
Transition of Care [facility]) - CM/SW Discharge Note   Patient Details  Name: Bianca Wolf MRN: [id number] Date of Birth: 1940/10/03  Transition of Care [facility]) CM/SW Contact:  Leone Haven, RN Phone Number: 02/14/2023, 4:26 PM   Clinical Narrative:    For dc today, s/p watchman. Has no needs. Daughter at bedside to transport her home and duaghter will be staying with her for couple nights.   Final next level of care: Home/Self Care Barriers to Discharge: No Barriers Identified   Patient Goals and CMS Choice   Choice offered to / list presented to : NA  Discharge Placement                         Discharge Plan and Services Additional resources added to the After Visit Summary for   In-house Referral: NA Discharge Planning Services: CM Consult Post Acute Care Choice: NA          DME Arranged: N/A DME Agency: NA       HH Arranged: NA          Social Determinants of Health (SDOH) Interventions SDOH Screenings   Tobacco Use: Medium Risk (02/14/2023)     Readmission Risk Interventions     No data to display

## 2023-02-14 NOTE — H&P (Signed)
Electrophysiology Office Note:     Date:  02/14/2023    ID:  Bianca, Wolf 11/05/1940, MRN [id number]   [facility] Cardiologist:  Little Ishikawa, MD  [facility] Electrophysiologist:  Lanier Prude, MD    Referring MD: Merri Brunette, MD    Chief Complaint: Atrial fibrillation   History of Present Illness:     Bianca Wolf is a 82 y.o. femalewho I am seeing today for an evaluation of atrial fibrillation at the request of Charlann Lange.   The patient was last seen by Jonny Ruiz on October 10, 2022.   The patient has a medical history that includes atrial fibrillation (diagnosed February 06, 2022), GERD, hypertension.  She did have a cardioversion on October 01, 2022.  At the appointment with Jonny Ruiz, she was in sinus rhythm.  At the last appointment with Jonny Ruiz, she started amiodarone and she was referred to discuss left atrial appendage occlusion.  She has forgotten multiple doses of her anticoagulation which led to the conversation regarding watchman implant.   She is with her daughter today in clinic.  She continues to intermittently miss doses of Eliquis.   Presents for [employer] implant. Procedure reviewed.         Objective Their past medical, social and family history was reveiwed.     ROS:   Please see the history of present illness.    All other systems reviewed and are negative.   EKGs/Labs/Other Studies Reviewed:     The following studies were reviewed today:   June 05, 2022 echo EF 65% RV function normal Dilated left and right atrium Trivial MR          Physical Exam:     VS:  BP (!) 205/68   Pulse 63   Ht 5\' 3"  (1.6 m)   Wt 166 lb 6.4 oz (75.5 kg)   SpO2 94%   BMI 29.48 kg/m         Wt Readings from Last 3 Encounters:  11/08/22 166 lb 6.4 oz (75.5 kg)  10/24/22 165 lb 6.4 oz (75 kg)  10/15/22 168 lb 6.4 oz (76.4 kg)      GEN:  Well nourished, well developed in no acute distress CARDIAC: RRR, no murmurs, rubs,  gallops RESPIRATORY:  Clear to auscultation without rales, wheezing or rhonchi          Assessment ASSESSMENT AND PLAN:     1. Persistent atrial fibrillation (HCC)   2. Confusion and disorientation   3. Primary hypertension   4. Encounter for long-term (current) use of high-risk medication       #Persistent atrial fibrillation #Medication regimen noncompliance #High risk med monitoring-amiodarone The patient has atrial fibrillation and has required cardioversion in the past. She is symptomatic while in atrial fibrillation.  She has been started on amiodarone. Continue amiodarone 200 mg by mouth once daily Continue Eliquis for now until watchman implanted   ------------------   I have seen Bianca Wolf in the office today who is being considered for a Watchman left atrial appendage closure device. I believe they will benefit from this procedure given their history of atrial fibrillation, CHA2DS2-VASc score of 4. Unfortunately, the patient is not felt to be a long term anticoagulation candidate secondary to medication regimen noncompliance. The patient's chart has been reviewed and I feel that they would be a candidate for short term oral anticoagulation after Watchman implant.    It is my belief that after undergoing a LAA closure  procedure, Bianca Wolf will not need long term anticoagulation which eliminates anticoagulation side effects and major bleeding risk.    Procedural risks for the Watchman implant have been reviewed with the patient including a 0.5% risk of stroke, <1% risk of perforation and <1% risk of device embolization. Other risks include bleeding, vascular damage, tamponade, worsening renal function, and death. The patient understands these risk and wishes to proceed.       The published clinical data on the safety and effectiveness of WATCHMAN include but are not limited to the following: - Holmes DR, Everlene Farrier, Sick P et al. for the PROTECT AF Investigators.  Percutaneous closure of the left atrial appendage versus warfarin therapy for prevention of stroke in patients with atrial fibrillation: a randomised non-inferiority trial. Lancet 2009; 374: 534-42. Everlene Farrier, Doshi SK, Isa Rankin D et al. on behalf of the PROTECT AF Investigators. Percutaneous Left Atrial Appendage Closure for Stroke Prophylaxis in Patients With Atrial Fibrillation 2.3-Year Follow-up of the PROTECT AF (Watchman Left Atrial Appendage System for Embolic Protection in Patients With Atrial Fibrillation) Trial. Circulation 2013; 127:720-729. - Alli O, Doshi S,  Kar S, Reddy VY, Sievert H et al. Quality of Life Assessment in the Randomized PROTECT AF (Percutaneous Closure of the Left Atrial Appendage Versus Warfarin Therapy for Prevention of Stroke in Patients With Atrial Fibrillation) Trial of Patients at Risk for Stroke With Nonvalvular Atrial Fibrillation. J Am Coll Cardiol 2013; 61:1790-8. Aline August DR, Mia Creek, Price M, Whisenant B, Sievert H, Doshi S, Huber K, Reddy V. Prospective randomized evaluation of the Watchman left atrial appendage Device in patients with atrial fibrillation versus long-term warfarin therapy; the PREVAIL trial. Journal of the [employer] of Cardiology, Vol. 4, No. 1, 2014, 1-11. - Kar S, Doshi SK, Sadhu A, Horton R, Osorio J et al. Primary outcome evaluation of a next-generation left atrial appendage closure device: results from the PINNACLE FLX trial. Circulation 2021;143(18)1754-1762.      After today's visit with the patient which was dedicated solely for shared decision making visit regarding LAA closure device, the patient decided to proceed with the LAA appendage closure procedure scheduled to be done in the near future at [facility]. Prior to the procedure, I would like to obtain a gated CT scan of the chest with contrast timed for PV/LA visualization.      HAS-BLED score 2 Hypertension Yes  Abnormal renal and liver function (Dialysis,  transplant, Cr >2.26 mg/dL /Cirrhosis or Bilirubin >2x Normal or AST/ALT/AP >3x Normal) No  Stroke No  Bleeding No  Labile INR (Unstable/high INR) No  Elderly (>65) Yes  Drugs or alcohol (>= 8 drinks/week, anti-plt or NSAID) No    CHA2DS2-VASc Score = 4  The patient's score is based upon: CHF History: 0 HTN History: 1 Diabetes History: 0 Stroke History: 0 Vascular Disease History: 0 Age Score: 2 Gender Score: 1    Presents for LAAO today. Procedure reviewed.   Signed, Rossie Muskrat. Lalla Brothers, MD, [facility], [facility] 02/14/2023 Electrophysiology [facility]

## 2023-02-14 NOTE — Transfer of Care (Signed)
Immediate Anesthesia Transfer of Care Note  Patient: Bianca Wolf  Procedure(s) Performed: LEFT ATRIAL APPENDAGE OCCLUSION TRANSESOPHAGEAL ECHOCARDIOGRAM  Patient Location: Cath Lab  Anesthesia Type:General  Level of Consciousness: awake, alert , and oriented  Airway & Oxygen Therapy: Patient Spontanous Breathing and Patient connected to nasal cannula oxygen  Post-op Assessment: Report given to RN and Post -op Vital signs reviewed and stable  Post vital signs: Reviewed and stable  Last Vitals:  Vitals Value Taken Time  BP 131/60 02/14/23 1145  Temp    Pulse 52 02/14/23 1146  Resp 25 02/14/23 1146  SpO2 96 % 02/14/23 1146  Vitals shown include unfiled device data.  Last Pain:  Vitals:   02/14/23 0802  PainSc: 0-No pain         Complications: No notable events documented.

## 2023-02-14 NOTE — Plan of Care (Signed)
[facility] Tonsillectomy/Adenoidectomy, Postoperative PEDS care plan entered in error.

## 2023-02-15 ENCOUNTER — Telehealth: Payer: Self-pay | Admitting: Cardiology

## 2023-02-15 ENCOUNTER — Encounter (HOSPITAL_COMMUNITY): Payer: Self-pay | Admitting: Cardiology

## 2023-02-15 NOTE — Telephone Encounter (Signed)
  [facility]   Watchman Team  Contacted the patient regarding discharge from [facility] on 02/14/23  The patient understands to follow up with Georgie Chard on 11/25  The patient understands discharge instructions? Yes  The patient understands medications and regimen? Yes   The patient reports groin site is without bleeding, swelling, or pain   The patient understands to call with any questions or concerns prior to scheduled visit.

## 2023-02-17 NOTE — Anesthesia Postprocedure Evaluation (Signed)
Anesthesia Post Note  Patient: Bianca Wolf  Procedure(s) Performed: LEFT ATRIAL APPENDAGE OCCLUSION TRANSESOPHAGEAL ECHOCARDIOGRAM     Patient location during evaluation: Cath Lab Anesthesia Type: General Level of consciousness: awake and alert Pain management: pain level controlled Vital Signs Assessment: post-procedure vital signs reviewed and stable Respiratory status: spontaneous breathing, nonlabored ventilation and respiratory function stable Cardiovascular status: blood pressure returned to baseline and stable Postop Assessment: no apparent nausea or vomiting Anesthetic complications: no   No notable events documented.               Colonel Krauser

## 2023-02-27 ENCOUNTER — Ambulatory Visit (HOSPITAL_COMMUNITY)
Admission: RE | Admit: 2023-02-27 | Discharge: 2023-02-27 | Disposition: A | Discharge status: Home / Self Care | Payer: Medicare HMO | Source: Ambulatory Visit | Attending: Internal Medicine | Admitting: Internal Medicine

## 2023-02-27 VITALS — BP 160/58 | HR 61 | Ht 63.0 in | Wt 170.8 lb

## 2023-02-27 DIAGNOSIS — I4891 Unspecified atrial fibrillation: Secondary | ICD-10-CM | POA: Diagnosis not present

## 2023-02-27 DIAGNOSIS — Z79899 Other long term (current) drug therapy: Secondary | ICD-10-CM | POA: Diagnosis not present

## 2023-02-27 DIAGNOSIS — Z5181 Encounter for therapeutic drug level monitoring: Secondary | ICD-10-CM

## 2023-02-27 DIAGNOSIS — Z7901 Long term (current) use of anticoagulants: Secondary | ICD-10-CM | POA: Insufficient documentation

## 2023-02-27 DIAGNOSIS — M810 Age-related osteoporosis without current pathological fracture: Secondary | ICD-10-CM | POA: Diagnosis not present

## 2023-02-27 DIAGNOSIS — I4819 Other persistent atrial fibrillation: Secondary | ICD-10-CM

## 2023-02-27 DIAGNOSIS — I10 Essential (primary) hypertension: Secondary | ICD-10-CM | POA: Diagnosis not present

## 2023-02-27 LAB — COMPREHENSIVE METABOLIC PANEL
ALT: 12 U/L (ref 0–44)
AST: 15 U/L (ref 15–41)
Albumin: 3.4 g/dL — ABNORMAL LOW (ref 3.5–5.0)
Alkaline Phosphatase: 65 U/L (ref 38–126)
Anion gap: 12 (ref 5–15)
BUN: 6 mg/dL — ABNORMAL LOW (ref 8–23)
CO2: 26 mmol/L (ref 22–32)
Calcium: 9.4 mg/dL (ref 8.9–10.3)
Chloride: 103 mmol/L (ref 98–111)
Creatinine, Ser: 0.98 mg/dL (ref 0.44–1.00)
GFR, Estimated: 57 mL/min — ABNORMAL LOW (ref >60)
Glucose, Bld: 97 mg/dL (ref 70–99)
Potassium: 4.5 mmol/L (ref 3.5–5.1)
Sodium: 141 mmol/L (ref 135–145)
Total Bilirubin: 0.7 mg/dL (ref <1.2)
Total Protein: 7.1 g/dL (ref 6.5–8.1)

## 2023-02-27 LAB — TSH: TSH: 0.162 u[IU]/mL — ABNORMAL LOW (ref 0.350–4.500)

## 2023-02-27 NOTE — Progress Notes (Signed)
Primary Care Physician: Merri Brunette, MD Referring Physician: Dr. Lavinia Sharps is a 82 y.o. female with a h/o HTN, new dx of atrial fibrillation being admitted 02/06/22 with RVR and undergoing successful TEE/DCCV on 02/09/22. She was placed on eliquis 25 mg bid for a CHA2DS2VASc  score of  5. Echo showed EF of 45-50%.   She was seen by Dr. Bjorn Pippin on 02/15/22, and was c/o of shortness of breath and fatigue. Her metoprolol was cut in half. She was scheduled to have f/u here. She remains in SR with reduction of BB with  her shortness of breath and fatigue being improved. Her BP was elevated on presentation at 208/76 but on recheck some 15 mins later it was 148/68. She saw her PCP yesterday and she talked like after blood results are known the provider would discuss a BP agent as her BP was elevated there as well. She works part time at [employer]. She has not had any awarenss of afib since the cardioversion. She is tolerating eliquis well, no issues with bleeding.   On follow up today 09/07/22, she is currently in atrial flutter with RVR. She missed her dose of Eliquis last night. Seen by Dr. Bjorn Pippin on 08/23/22 and noted to be in Afib with RVR. She was only taking Eliquis 5 mg once daily, advised to take correct twice daily dosage, and plan for DCCV after 3 weeks of anticoagulation and discuss AAD. She is a little short of breath on exertion when in Afib. She currently has a cold and cough with it.   On follow up 10/10/22, she is currently in NSR. S/p successful DCCV on 10/01/22. She feels well overall. No missed doses of Eliquis.   On follow up 02/27/23, she is currently in NSR. S/p Watchman procedure on 02/14/23 by Dr. Lalla Brothers. She is currently taking amiodarone 200 mg Daily. She has had no episodes of Afib since last office visit. She feels well overall. She still notes to be a little SOB at times.   Today, she denies symptoms of palpitations, chest pain, orthopnea, PND, lower extremity  edema, dizziness, presyncope, syncope, or neurologic sequela. The patient is tolerating medications without difficulties and is otherwise without complaint today.   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 Lalla Brothers   Past Surgical History:  Procedure Laterality Date   BUBBLE STUDY  02/09/2022   Procedure: BUBBLE STUDY;  Surgeon: Parke Poisson, MD;  Location: [facility] ENDOSCOPY;  Service: Cardiovascular;;   CARDIOVERSION N/A 02/09/2022   Procedure: CARDIOVERSION;  Surgeon: Parke Poisson, MD;  Location: [facility] ENDOSCOPY;  Service: Cardiovascular;  Laterality: N/A;   CARDIOVERSION N/A 10/01/2022   Procedure: CARDIOVERSION;  Surgeon: Quintella Reichert, MD;  Location: [facility] INVASIVE CV LAB;  Service: Cardiovascular;  Laterality: N/A;   IR KYPHO LUMBAR INC FX REDUCE BONE BX UNI/BIL CANNULATION INC/IMAGING  09/21/2021   LEFT ATRIAL APPENDAGE OCCLUSION N/A 02/14/2023   Procedure: LEFT ATRIAL APPENDAGE OCCLUSION;  Surgeon: Lanier Prude, MD;  Location: [facility] INVASIVE CV LAB;  Service: Cardiovascular;  Laterality: N/A;   TEE WITHOUT CARDIOVERSION N/A 02/09/2022   Procedure: TRANSESOPHAGEAL ECHOCARDIOGRAM (TEE);  Surgeon: Parke Poisson, MD;  Location: [facility] ENDOSCOPY;  Service: Cardiovascular;  Laterality: N/A;   TEE WITHOUT CARDIOVERSION N/A 02/14/2023  Procedure: TRANSESOPHAGEAL ECHOCARDIOGRAM;  Surgeon: Lanier Prude, MD;  Location: [facility] INVASIVE CV LAB;  Service: Cardiovascular;  Laterality: N/A;    Current Outpatient Medications  Medication Sig Dispense Refill   acetaminophen (TYLENOL) 325 MG tablet Take 2 tablets (650 mg total) by mouth every 6 (six) hours as needed. (Patient taking differently: Take 650 mg by mouth as needed.) 30 tablet 0   amiodarone (PACERONE) 200 MG tablet Take 1 tablet (200  mg total) by mouth daily. TAKE 1 TABLET BY MOUTH TWICE DAILY FOR 14 DAYS THEN  ONE  TABLET  ONCE  DAILY (Patient taking differently: Take 200 mg by mouth daily.) 90 tablet 1   apixaban (ELIQUIS) 5 MG TABS tablet Take 1 tablet (5 mg total) by mouth 2 (two) times daily. 60 tablet 1   benazepril (LOTENSIN) 20 MG tablet Take 10 mg by mouth in the morning.     Calcium Citrate-Vitamin D (CITRACAL + D PO) Take 1 tablet by mouth every morning.     cyanocobalamin 2000 MCG tablet Take 2,000 mcg by mouth daily.     estradiol (ESTRACE) 0.1 MG/GM vaginal cream Place 1 Applicatorful vaginally as needed.     furosemide (LASIX) 20 MG tablet Take 1 tablet (20 mg total) by mouth daily as needed (for weight increase of 3 lbs overnight or 5 lbs in 1 week). (Patient taking differently: Take 20 mg by mouth as needed (for weight increase of 3 lbs overnight or 5 lbs in 1 week).) 30 tablet 3   metoprolol succinate (TOPROL-XL) 25 MG 24 hr tablet Take 1 tablet (25 mg total) by mouth daily. Take with or immediately following a meal. 90 tablet 3   omeprazole (PRILOSEC) 20 MG capsule Take 20 mg by mouth daily before breakfast.     No current facility-administered medications for this encounter.    No Known Allergies  ROS- All systems are reviewed and negative except as per the HPI above  Physical Exam: Vitals:   02/27/23 1515  BP: (!) 160/58  Pulse: 61  Weight: 77.5 kg  Height: 5\' 3"  (1.6 m)    Wt Readings from Last 3 Encounters:  02/27/23 77.5 kg  02/14/23 78 kg  02/08/23 77.8 kg    Labs: Lab Results  Component Value Date   NA 142 02/08/2023   K 3.9 02/08/2023   CL 101 02/08/2023   CO2 30 (H) 02/08/2023   GLUCOSE 75 02/08/2023   BUN 9 02/08/2023   CREATININE 0.83 02/08/2023   CALCIUM 9.3 02/08/2023   PHOS 4.0 02/10/2022   MG 1.8 02/10/2022   Lab Results  Component Value Date   INR 1.2 02/08/2022   No results found for: "CHOL", "HDL", "LDLCALC", "TRIG"  GEN- The patient is well appearing, alert  and oriented x 3 today.   Neck - no JVD or carotid bruit noted Lungs- Clear to ausculation bilaterally, normal work of breathing Heart- Regular rate and rhythm, no murmurs, rubs or gallops, PMI not laterally displaced Extremities- no clubbing, cyanosis, or edema Skin - no rash or ecchymosis noted  EKG- Vent. rate 61 BPM PR interval 172 ms QRS duration 86 ms QT/QTcB 456/459 ms P-R-T axes 82 63 66 Sinus bradycardia with occasional Premature ventricular complexes Otherwise normal ECG When compared with ECG of 08-Feb-2023 09:31, PREVIOUS ECG IS PRESENT  Echo 06/05/22:  1. Left ventricular ejection fraction, by estimation, is 65 to 70%. The  left ventricle has normal function. The left ventricle has no regional  wall motion abnormalities. There  is moderate concentric left ventricular  hypertrophy. Left ventricular  diastolic parameters are consistent with Grade II diastolic dysfunction  (pseudonormalization).   2. Right ventricular systolic function is normal. The right ventricular  size is normal. There is moderately elevated pulmonary artery systolic  pressure.   3. Left atrial size was severely dilated.   4. Right atrial size was mildly dilated.   5. The mitral valve is normal in structure. Trivial mitral valve  regurgitation. No evidence of mitral stenosis.   6. The aortic valve is tricuspid. Aortic valve regurgitation is not  visualized. No aortic stenosis is present.   7. The inferior vena cava is normal in size with greater than 50%  respiratory variability, suggesting right atrial pressure of 3 mmHg.   Assessment and Plan:  1. Persistent Afib Cardioverted in the hospital 02/10/22  S/p successful DCCV on 10/01/22.  S/p Watchman procedure on 02/14/23 by Dr. Lalla Brothers.  She is in NSR.  Qtc stable. Continue amiodarone 200 mg daily. Cmet and TSH drawn today.  2. CHA2DS2VASc  of 5 Continue eliquis 5 mg bid per post-Watchman protocol.    3. HTN Stable today no  changes.   Follow up as scheduled for Watchman procedure follow up apointment. F/u 6 months Afib clinic for amiodarone surveillance.    Lake Bells, PA-C Afib Clinic [facility] [address] [place], [place] [address] [phone]

## 2023-03-06 DIAGNOSIS — Z95818 Presence of other cardiac implants and grafts: Secondary | ICD-10-CM | POA: Diagnosis not present

## 2023-03-06 DIAGNOSIS — Z09 Encounter for follow-up examination after completed treatment for conditions other than malignant neoplasm: Secondary | ICD-10-CM | POA: Diagnosis not present

## 2023-03-06 DIAGNOSIS — I10 Essential (primary) hypertension: Secondary | ICD-10-CM | POA: Diagnosis not present

## 2023-03-06 DIAGNOSIS — I480 Paroxysmal atrial fibrillation: Secondary | ICD-10-CM | POA: Diagnosis not present

## 2023-03-08 ENCOUNTER — Encounter (HOSPITAL_COMMUNITY): Payer: Self-pay | Admitting: *Deleted

## 2023-03-15 NOTE — Progress Notes (Unsigned)
[facility]                                     Cardiology Office Note:    Date:  03/18/2023   ID:  [name], [name] March 01, 1941, MRN [id number]  PCP:  Merri Brunette, MD  [facility] Cardiologist:  Little Ishikawa, MD  [facility] Electrophysiologist:  Lanier Prude, MD   Referring MD: Merri Brunette, MD   Chief Complaint  Patient presents with   1 month s/p LAAO    History of Present Illness:    Bianca Wolf is a 82 y.o. female with a hx of HTN, GERD, and PAF (diagnosed in 10/203) and s/p DCCV in 09/2022 on amiodarone and Eliquis who is now s/p LAAO and is here today for 1 month follow up.  She was admitted 02/06/2022 with palpitations, found to have new onset atrial fibrillation with RVR. Echocardiogram showed EF 45 to 50%, mild RV systolic dysfunction. She was started on Eliquis and metoprolol. Underwent successful TEE/DCCV on 02/09/2022. Echocardiogram 06/05/2022 showed EF 65 to 70%, moderate LVH, grade 2 diastolic dysfunction, normal RV function, moderately elevated pulmonary pressures, severe left atrial enlargement, mild right atrial enlargement. She was placed on OAC with Eliquis. She has forgotten multiple doses of her anticoagulation which led to the conversation regarding Watchman implant and referral to Dr. Lalla Brothers.   Pre Watchman cardiac CT showed the left atrial appendage is a large chicken wing morphology without thrombus and suitable for a 27 mm Watchman FLX device. Also noted to have a moderately elevated CAC score of 256, which is 63rd percentile for age-, sex-, and race-matched controls.  She is now s/p LAAO with 24mm Watchman FLX Pro device 02/14/23. She was restarted on Eliquis the evening of her procedure with plans to continue for 45 days (12/1) then stop. Today she is here with her daughter and reports that she has been well with no issues. Tolerating medications and denies chest pain, SOB, palpitations, LE edema, orthopnea, PND,  dizziness, or syncope. Denies bleeding in stool or urine.   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 Lalla Brothers    Past Surgical History:  Procedure Laterality Date   BUBBLE STUDY  02/09/2022   Procedure: BUBBLE STUDY;  Surgeon: Parke Poisson, MD;  Location: [facility] ENDOSCOPY;  Service: Cardiovascular;;   CARDIOVERSION N/A 02/09/2022   Procedure: CARDIOVERSION;  Surgeon: Parke Poisson, MD;  Location: [facility] ENDOSCOPY;  Service: Cardiovascular;  Laterality: N/A;   CARDIOVERSION N/A 10/01/2022   Procedure: CARDIOVERSION;  Surgeon: Quintella Reichert, MD;  Location: [facility] INVASIVE CV LAB;  Service: Cardiovascular;  Laterality: N/A;   IR KYPHO LUMBAR INC FX REDUCE BONE BX UNI/BIL CANNULATION INC/IMAGING  09/21/2021   LEFT ATRIAL APPENDAGE OCCLUSION N/A 02/14/2023   Procedure: LEFT ATRIAL APPENDAGE OCCLUSION;  Surgeon: Lanier Prude, MD;  Location: [facility] INVASIVE CV LAB;  Service: Cardiovascular;  Laterality: N/A;   TEE WITHOUT CARDIOVERSION N/A 02/09/2022   Procedure: TRANSESOPHAGEAL ECHOCARDIOGRAM (TEE);  Surgeon: Parke Poisson, MD;  Location: [facility] ENDOSCOPY;  Service: Cardiovascular;  Laterality: N/A;   TEE WITHOUT CARDIOVERSION N/A 02/14/2023   Procedure: TRANSESOPHAGEAL ECHOCARDIOGRAM;  Surgeon: Lanier Prude, MD;  Location: [facility] INVASIVE CV LAB;  Service: Cardiovascular;  Laterality: N/A;    Current Medications: Current Meds  Medication Sig   acetaminophen (TYLENOL) 325 MG tablet Take 2 tablets (650 mg total) by mouth every 6 (six) hours as needed. (Patient taking differently: Take 650 mg by mouth as needed.)   amiodarone (PACERONE) 200 MG tablet Take 1 tablet (200 mg total) by mouth daily. TAKE 1 TABLET BY MOUTH TWICE DAILY FOR 14 DAYS THEN  ONE  TABLET  ONCE  DAILY (Patient taking  differently: Take 200 mg by mouth daily.)   apixaban (ELIQUIS) 5 MG TABS tablet Take 1 tablet (5 mg total) by mouth 2 (two) times daily.   benazepril (LOTENSIN) 20 MG tablet Take 10 mg by mouth in the morning.   Calcium Citrate-Vitamin D (CITRACAL + D PO) Take 1 tablet by mouth every morning.   clopidogrel (PLAVIX) 75 MG tablet Take 1 tablet (75 mg total) by mouth daily.   cyanocobalamin 2000 MCG tablet Take 2,000 mcg by mouth daily.   estradiol (ESTRACE) 0.1 MG/GM vaginal cream Place 1 Applicatorful vaginally as needed.   furosemide (LASIX) 20 MG tablet Take 1 tablet (20 mg total) by mouth daily as needed (for weight increase of 3 lbs overnight or 5 lbs in 1 week). (Patient taking differently: Take 20 mg by mouth as needed (for weight increase of 3 lbs overnight or 5 lbs in 1 week).)   metoprolol succinate (TOPROL-XL) 25 MG 24 hr tablet Take 1 tablet (25 mg total) by mouth daily. Take with or immediately following a meal.   omeprazole (PRILOSEC) 20 MG capsule Take 20 mg by mouth daily before breakfast.     Allergies:   Patient has no known allergies.   Social History   Socioeconomic History   Marital status: Widowed    Spouse name: Not on file   Number of children: Not on file   Years of education: Not on file   Highest education level: Not on file  Occupational History   Not on file  Tobacco Use   Smoking status: Former    Current packs/day: 0.00    Average packs/day: 1 pack/day for 25.0 years (25.0 ttl pk-yrs)    Types: Cigarettes    Start date: 81    Quit date: 84    Years since quitting: 44.9   Smokeless tobacco: Never  Vaping Use   Vaping status: Never Used  Substance and Sexual Activity   Alcohol use: No   Drug use: No   Sexual activity: Yes    Birth control/protection: None  Other Topics Concern   Not on file  Social History Narrative   Not on file   Social Determinants of Health   Financial Resource Strain: Not on file  Food Insecurity: Not on file   Transportation Needs: Not on file  Physical Activity: Not on file  Stress: Not on file  Social Connections: Not on file     Family History: The patient's family history includes Diabetes in her mother; Heart disease in her father; Hypertension in her father and mother. There is no history of Atrial fibrillation.  ROS:   Please see the history of present illness.    All other systems reviewed and are negative.  EKGs/Labs/Other Studies Reviewed:    The following studies were reviewed today:   Cardiac Studies & Procedures       ECHOCARDIOGRAM  ECHOCARDIOGRAM COMPLETE 06/05/2022  Narrative ECHOCARDIOGRAM REPORT    Patient Name:   ALIDIA BARDER  Flott Date of Exam: 06/05/2022 Medical Rec #:  [id number]      Height:       64.0 in Accession #:    [id number]     Weight:       165.2 lb Date of Birth:  1941-03-08      BSA:          1.804 m Patient Age:    81 years       BP:           142/86 mmHg Patient Gender: F              HR:           71 bpm. Exam Location:  [address]  Procedure: 2D Echo, Cardiac Doppler and Color Doppler  Indications:    I50.9 CHF  History:        Patient has prior history of Echocardiogram examinations, most recent 02/07/2022. CHF, Arrythmias:Atrial Fibrillation and Palpitations; Risk Factors:Hypertension.  Sonographer:    Samule Ohm RDCS Referring Phys: [id number] CHRISTOPHER L SCHUMANN  IMPRESSIONS   1. Left ventricular ejection fraction, by estimation, is 65 to 70%. The left ventricle has normal function. The left ventricle has no regional wall motion abnormalities. There is moderate concentric left ventricular hypertrophy. Left ventricular diastolic parameters are consistent with Grade II diastolic dysfunction (pseudonormalization). 2. Right ventricular systolic function is normal. The right ventricular size is normal. There is moderately elevated pulmonary artery systolic pressure. 3. Left atrial size was severely dilated. 4. Right atrial size  was mildly dilated. 5. The mitral valve is normal in structure. Trivial mitral valve regurgitation. No evidence of mitral stenosis. 6. The aortic valve is tricuspid. Aortic valve regurgitation is not visualized. No aortic stenosis is present. 7. The inferior vena cava is normal in size with greater than 50% respiratory variability, suggesting right atrial pressure of 3 mmHg.  FINDINGS Left Ventricle: Left ventricular ejection fraction, by estimation, is 65 to 70%. The left ventricle has normal function. The left ventricle has no regional wall motion abnormalities. The left ventricular internal cavity size was normal in size. There is moderate concentric left ventricular hypertrophy. Left ventricular diastolic parameters are consistent with Grade II diastolic dysfunction (pseudonormalization).  Right Ventricle: The right ventricular size is normal. No increase in right ventricular wall thickness. Right ventricular systolic function is normal. There is moderately elevated pulmonary artery systolic pressure. The tricuspid regurgitant velocity is 3.25 m/s, and with an assumed right atrial pressure of 3 mmHg, the estimated right ventricular systolic pressure is 45.2 mmHg.  Left Atrium: Left atrial size was severely dilated.  Right Atrium: Right atrial size was mildly dilated.  Pericardium: There is no evidence of pericardial effusion.  Mitral Valve: The mitral valve is normal in structure. Trivial mitral valve regurgitation. No evidence of mitral valve stenosis.  Tricuspid Valve: The tricuspid valve is normal in structure. Tricuspid valve regurgitation is mild . No evidence of tricuspid stenosis.  Aortic Valve: The aortic valve is tricuspid. Aortic valve regurgitation is not visualized. No aortic stenosis is present.  Pulmonic Valve: The pulmonic valve was normal in structure. Pulmonic valve regurgitation is not visualized. No evidence of pulmonic stenosis.  Aorta: The aortic root is normal in  size and structure.  Venous: The inferior vena cava is normal in size with greater than 50% respiratory variability, suggesting right atrial pressure of 3 mmHg.  IAS/Shunts: No atrial level shunt detected by color flow Doppler.   LEFT VENTRICLE PLAX 2D LVIDd:  4.70 cm   Diastology LVIDs:         3.10 cm   LV e' medial:    5.55 cm/s LV PW:         1.50 cm   LV E/e' medial:  20.0 LV IVS:        1.50 cm   LV e' lateral:   12.10 cm/s LVOT diam:     1.80 cm   LV E/e' lateral: 9.2 LV SV:         76 LV SV Index:   42 LVOT Area:     2.54 cm   RIGHT VENTRICLE             IVC RV S prime:     20.30 cm/s  IVC diam: 2.00 cm TAPSE (M-mode): 1.9 cm RVSP:           45.2 mmHg  LEFT ATRIUM             Index        RIGHT ATRIUM           Index LA diam:        4.60 cm 2.55 cm/m   RA Pressure: 3.00 mmHg LA Vol (A2C):   81.3 ml 45.07 ml/m  RA Area:     17.80 cm LA Vol (A4C):   65.6 ml 36.37 ml/m  RA Volume:   54.90 ml  30.44 ml/m LA Biplane Vol: 77.8 ml 43.13 ml/m AORTIC VALVE LVOT Vmax:   141.00 cm/s LVOT Vmean:  90.700 cm/s LVOT VTI:    0.300 m  AORTA Ao Root diam: 3.00 cm Ao Asc diam:  3.10 cm  MITRAL VALVE                TRICUSPID VALVE MV Area (PHT): 2.84 cm     TR Peak grad:   42.2 mmHg MV Decel Time: 267 msec     TR Vmax:        325.00 cm/s MV E velocity: 111.00 cm/s  Estimated RAP:  3.00 mmHg MV A velocity: 93.30 cm/s   RVSP:           45.2 mmHg MV E/A ratio:  1.19 SHUNTS Systemic VTI:  0.30 m Systemic Diam: 1.80 cm  Chilton Si MD Electronically signed by Chilton Si MD Signature Date/Time: 06/05/2022/7:39:37 PM    Final   TEE  ECHO TEE 02/14/2023  Narrative TRANSESOPHOGEAL ECHO REPORT    Patient Name:   ACCACIA DIMURO Date of Exam: 02/14/2023 Medical Rec #:  [id number]      Height:       63.0 in Accession #:    [id number]     Weight:       172.0 lb Date of Birth:  10/24/1940      BSA:          1.814 m Patient Age:    82 years       BP:            184/70 mmHg Patient Gender: F              HR:           54 bpm. Exam Location:  Inpatient  Procedure: Transesophageal Echo, 3D Echo, Color Doppler and Cardiac Doppler  Indications:     I48.91* Unspecified atrial fibrillation  History:         Patient has prior history of Echocardiogram examinations, most recent 06/05/2022. Arrythmias:Atrial Fibrillation; Risk Factors:Hypertension and Dyslipidemia.  Sonographer:  [facility] Senior RDCS Referring Phys:  [id number] Lanier Prude Diagnosing Phys: Riley Lam MD  PROCEDURE: After discussion of the risks and benefits of a TEE, an informed consent was obtained from the patient. The transesophogeal probe was passed without difficulty through the esophogus of the patient. Sedation performed by different physician. The patient was monitored while under deep sedation. The patient developed no complications during the procedure.  IMPRESSIONS   1. Patent left atrial appendage. Maximal ostial diameter 17 mm on 2D assessment, 19 mm on 3D assessment. A mid inferior transeptal puncture was performed. A 27 mm Watchman FLX device was attempted- large mitral shoulder, device was recaptured. A 24 mm Watchman FLX device was placed. No peri-device leak. Small mitral shoulder. Average compression ~ 12%. Successful Watchman placement with only left to right shunting and no pericardial effusion. Left atrial size was mildly dilated. No left atrial/left atrial appendage thrombus was detected. 2. Left ventricular ejection fraction, by estimation, is 60 to 65%. The left ventricle has normal function. 3. Right ventricular systolic function is normal. The right ventricular size is normal. 4. Right atrial size was mildly dilated. 5. The mitral valve is grossly normal. No evidence of mitral valve regurgitation. No evidence of mitral stenosis. 6. The aortic valve is tricuspid. Aortic valve regurgitation is not visualized. Aortic valve sclerosis is  present, with no evidence of aortic valve stenosis. 7. There is mild (Grade II) plaque involving the descending aorta. 8. Evidence of atrial level shunting detected by color flow Doppler.  FINDINGS Left Ventricle: Left ventricular ejection fraction, by estimation, is 60 to 65%. The left ventricle has normal function. The left ventricular internal cavity size was normal in size.  Right Ventricle: The right ventricular size is normal. No increase in right ventricular wall thickness. Right ventricular systolic function is normal.  Left Atrium: Patent left atrial appendage. Maximal ostial diameter 17 mm on 2D assessment, 19 mm on 3D assessment. A mid inferior transeptal puncture was performed. A 27 mm Watchman FLX device was attempted- large mitral shoulder, device was recaptured. A 24 mm Watchman FLX device was placed. No peri-device leak. Small mitral shoulder. Average compression ~ 12%. Successful Watchman placement with only left to right shunting and no pericardial effusion. Left atrial size was mildly dilated. No left atrial/left atrial appendage thrombus was detected.  Right Atrium: Right atrial size was mildly dilated.  Pericardium: There is no evidence of pericardial effusion.  Mitral Valve: The mitral valve is grossly normal. No evidence of mitral valve regurgitation. No evidence of mitral valve stenosis.  Tricuspid Valve: The tricuspid valve is normal in structure. Tricuspid valve regurgitation is mild . No evidence of tricuspid stenosis.  Aortic Valve: The aortic valve is tricuspid. Aortic valve regurgitation is not visualized. Aortic valve sclerosis is present, with no evidence of aortic valve stenosis.  Pulmonic Valve: The pulmonic valve was normal in structure. Pulmonic valve regurgitation is trivial. No evidence of pulmonic stenosis.  Aorta: The aortic root, ascending aorta, aortic arch and descending aorta are all structurally normal, with no evidence of dilitation or  obstruction. There is mild (Grade II) plaque involving the descending aorta.  IAS/Shunts: Evidence of atrial level shunting detected by color flow Doppler.  Additional Comments: Spectral Doppler performed.  Riley Lam MD Electronically signed by Riley Lam MD Signature Date/Time: 02/14/2023/12:10:43 PM    Final             EKG:  EKG is not ordered today.    Recent Labs: 05/14/2022: BNP 219.0  02/08/2023: Hemoglobin 12.0; Platelets 220 02/27/2023: ALT 12; BUN 6; Creatinine, Ser 0.98; Potassium 4.5; Sodium 141; TSH 0.162   Recent Lipid Panel No results found for: "CHOL", "TRIG", "HDL", "CHOLHDL", "VLDL", "LDLCALC", "LDLDIRECT"  Risk Assessment/Calculations:    CHA2DS2-VASc Score = 4  The patient's score is based upon: CHF History: 0 HTN History: 1 Diabetes History: 0 Stroke History: 0 Vascular Disease History: 0 Age Score: 2 Gender Score: 1   Physical Exam:    VS:  BP 130/82   Pulse 74   Ht 5\' 3"  (1.6 m)   Wt 171 lb (77.6 kg)   SpO2 96%   BMI 30.29 kg/m     Wt Readings from Last 3 Encounters:  03/18/23 171 lb (77.6 kg)  02/27/23 170 lb 12.8 oz (77.5 kg)  02/14/23 172 lb (78 kg)    General: Well developed, well nourished, NAD Lungs:Clear to ausculation bilaterally. No wheezes, rales, or rhonchi. Breathing is unlabored. Cardiovascular: RRR with S1 S2. No murmurs Extremities: No edema.  Neuro: Alert and oriented. No focal deficits. No facial asymmetry. MAE spontaneously. Psych: Responds to questions appropriately with normal affect.    ASSESSMENT/PLAN:    Paroxsymal atrial fibrillation: s/p LAAO with 24mm Watchman FLX Pro on 02/14/23. Given duration since procedure, she will stop Eliquis after today and start Plavix 75mg  daily to complete 6 months of therapy. Plan to hold Prilosec until after she is off Plavix. Will use Protonix in the interm. Post Watchman CT instructions reviewed with all questions answered. Patient does not attend dental  appointments therefore no SBE needed at this time. Obtain BMET today. Follow up 07/2023 with our team.   HTN: Stable with no changes needed at this time. Continue benazepril and Toprol.    I spent 15 minutes caring for this patient today including face-to-face discussions, ordering and reviewing labs, reviewing records from [facility] and other outside facilities, documenting in the record, and arranging for follow up.      Medication Adjustments/Labs and Tests Ordered: Current medicines are reviewed at length with the patient today.  Concerns regarding medicines are outlined above.  Orders Placed This Encounter  Procedures   Basic Metabolic Panel (BMET)   Meds ordered this encounter  Medications   clopidogrel (PLAVIX) 75 MG tablet    Sig: Take 1 tablet (75 mg total) by mouth daily.    Dispense:  90 tablet    Refill:  1    Patient Instructions  Medication Instructions:  STOP Eliquis after today  START Plavix 75mg  Take 1 tablet once a day  *If you need a refill on your cardiac medications before your next appointment, please call your pharmacy*   Lab Work: TODAY-BMET  If you have labs (blood work) drawn today and your tests are completely normal, you will receive your results only by: MyChart Message (if you have MyChart) OR A paper copy in the mail If you have any lab test that is abnormal or we need to change your treatment, we will call you to review the results.   Testing/Procedures: NONE ORDERED   Follow-Up: At [facility], you and your health needs are our priority.  As part of our continuing mission to provide you with exceptional heart care, we have created designated Provider Care Teams.  These Care Teams include your primary Cardiologist (physician) and Advanced Practice Providers (APPs -  Physician Assistants and Nurse Practitioners) who all work together to provide you with the care you need, when you need it.  We  recommend signing up for the patient  portal called "MyChart".  Sign up information is provided on this After Visit Summary.  MyChart is used to connect with patients for Virtual Visits (Telemedicine).  Patients are able to view lab/test results, encounter notes, upcoming appointments, etc.  Non-urgent messages can be sent to your provider as well.   To learn more about what you can do with MyChart, go to [email or web address].    Your next appointment:   6 month(s)  Provider:   Georgie Chard, NP    Other Instructions     Signed, Georgie Chard, NP  03/18/2023 11:01 AM    [facility]

## 2023-03-18 ENCOUNTER — Ambulatory Visit: Payer: Medicare HMO | Attending: Cardiology | Admitting: Cardiology

## 2023-03-18 VITALS — BP 130/82 | HR 74 | Ht 63.0 in | Wt 171.0 lb

## 2023-03-18 DIAGNOSIS — Z01812 Encounter for preprocedural laboratory examination: Secondary | ICD-10-CM

## 2023-03-18 DIAGNOSIS — Z7901 Long term (current) use of anticoagulants: Secondary | ICD-10-CM

## 2023-03-18 DIAGNOSIS — Z95818 Presence of other cardiac implants and grafts: Secondary | ICD-10-CM | POA: Diagnosis not present

## 2023-03-18 DIAGNOSIS — I10 Essential (primary) hypertension: Secondary | ICD-10-CM

## 2023-03-18 DIAGNOSIS — I480 Paroxysmal atrial fibrillation: Secondary | ICD-10-CM

## 2023-03-18 MED ORDER — CLOPIDOGREL BISULFATE 75 MG PO TABS: 75.0000 mg | ORAL_TABLET | Freq: Every day | ORAL | 1 refills | Status: DC

## 2023-03-18 NOTE — Patient Instructions (Signed)
Medication Instructions:  STOP Eliquis after today  START Plavix 75mg  Take 1 tablet once a day  *If you need a refill on your cardiac medications before your next appointment, please call your pharmacy*   Lab Work: TODAY-BMET  If you have labs (blood work) drawn today and your tests are completely normal, you will receive your results only by: MyChart Message (if you have MyChart) OR A paper copy in the mail If you have any lab test that is abnormal or we need to change your treatment, we will call you to review the results.   Testing/Procedures: NONE ORDERED   Follow-Up: At [facility], you and your health needs are our priority.  As part of our continuing mission to provide you with exceptional heart care, we have created designated Provider Care Teams.  These Care Teams include your primary Cardiologist (physician) and Advanced Practice Providers (APPs -  Physician Assistants and Nurse Practitioners) who all work together to provide you with the care you need, when you need it.  We recommend signing up for the patient portal called "MyChart".  Sign up information is provided on this After Visit Summary.  MyChart is used to connect with patients for Virtual Visits (Telemedicine).  Patients are able to view lab/test results, encounter notes, upcoming appointments, etc.  Non-urgent messages can be sent to your provider as well.   To learn more about what you can do with MyChart, go to [email or web address].    Your next appointment:   6 month(s)  Provider:   Georgie Chard, NP    Other Instructions

## 2023-03-19 LAB — BASIC METABOLIC PANEL
BUN/Creatinine Ratio: 15 (ref 12–28)
BUN: 15 mg/dL (ref 8–27)
CO2: 26 mmol/L (ref 20–29)
Calcium: 9.9 mg/dL (ref 8.7–10.3)
Chloride: 99 mmol/L (ref 96–106)
Creatinine, Ser: 0.97 mg/dL (ref 0.57–1.00)
Glucose: 80 mg/dL (ref 70–99)
Potassium: 4.4 mmol/L (ref 3.5–5.2)
Sodium: 139 mmol/L (ref 134–144)
eGFR: 58 mL/min/{1.73_m2} — ABNORMAL LOW (ref >59)

## 2023-03-20 ENCOUNTER — Telehealth: Payer: Self-pay | Admitting: Cardiology

## 2023-03-20 NOTE — Telephone Encounter (Signed)
Left message for patient with Jill's recommendation: ----- Message from Georgie Chard sent at 03/20/2023  3:40 PM EST ----- Regarding: Prilosec Please let the patient know that I stopped her Prilosec while she takes Plavix, as this medicine will make the Plavix not work as well. She can take over the counter Protonix (or I can prescribe this). Once we stop Plavix, she can certainly restart Prilosec at that time.    Thanks  Noreene Larsson    Provided office number for callback if any questions or if she would like Noreene Larsson to send in prescription for Protonix.

## 2023-03-20 NOTE — Telephone Encounter (Signed)
-----   Message from Georgie Chard sent at 03/20/2023  3:40 PM EST ----- Regarding: Prilosec Please let the patient know that I stopped her Prilosec while she takes Plavix, as this medicine will make the Plavix not work as well. She can take over the counter Protonix (or I can prescribe this). Once we stop Plavix, she can certainly restart Prilosec at that time.   Thanks  [place]

## 2023-03-26 NOTE — Telephone Encounter (Signed)
Left message for patient to callback to confirm she received message about changing Prilosec to Protonix while taking Plavix.

## 2023-04-18 ENCOUNTER — Telehealth (HOSPITAL_COMMUNITY): Payer: Self-pay | Admitting: *Deleted

## 2023-04-18 NOTE — Telephone Encounter (Signed)
Attempted to call patient regarding upcoming cardiac CT appointment. °Left message on voicemail with name and callback number ° °Edie Vallandingham RN Navigator Cardiac Imaging °[facility] °[phone] Office °[phone] Cell ° °

## 2023-04-19 ENCOUNTER — Ambulatory Visit (HOSPITAL_COMMUNITY)
Admission: RE | Admit: 2023-04-19 | Discharge: 2023-04-19 | Disposition: A | Discharge status: Home / Self Care | Payer: Medicare HMO | Source: Ambulatory Visit | Attending: Internal Medicine | Admitting: Internal Medicine

## 2023-04-19 DIAGNOSIS — I517 Cardiomegaly: Secondary | ICD-10-CM | POA: Diagnosis not present

## 2023-04-19 DIAGNOSIS — Z95818 Presence of other cardiac implants and grafts: Secondary | ICD-10-CM | POA: Insufficient documentation

## 2023-04-19 DIAGNOSIS — I4819 Other persistent atrial fibrillation: Secondary | ICD-10-CM | POA: Diagnosis not present

## 2023-04-19 MED ORDER — IOHEXOL 350 MG/ML SOLN
100.0000 mL | Freq: Once | INTRAVENOUS | Status: AC | PRN
  Administered 2023-04-19: 100 mL via INTRAVENOUS

## 2023-05-01 DIAGNOSIS — N3000 Acute cystitis without hematuria: Secondary | ICD-10-CM | POA: Diagnosis not present

## 2023-05-01 DIAGNOSIS — R0609 Other forms of dyspnea: Secondary | ICD-10-CM | POA: Diagnosis not present

## 2023-05-01 DIAGNOSIS — I4819 Other persistent atrial fibrillation: Secondary | ICD-10-CM | POA: Diagnosis not present

## 2023-05-01 DIAGNOSIS — N23 Unspecified renal colic: Secondary | ICD-10-CM | POA: Diagnosis not present

## 2023-05-08 DIAGNOSIS — I10 Essential (primary) hypertension: Secondary | ICD-10-CM | POA: Diagnosis not present

## 2023-05-08 DIAGNOSIS — I4819 Other persistent atrial fibrillation: Secondary | ICD-10-CM | POA: Diagnosis not present

## 2023-05-23 ENCOUNTER — Encounter: Payer: Self-pay | Admitting: Family Medicine

## 2023-05-23 ENCOUNTER — Ambulatory Visit (INDEPENDENT_AMBULATORY_CARE_PROVIDER_SITE_OTHER): Payer: Medicare HMO

## 2023-05-23 ENCOUNTER — Ambulatory Visit: Payer: Medicare HMO | Admitting: Family Medicine

## 2023-05-23 VITALS — BP 150/92 | HR 78 | Ht 63.0 in | Wt 172.0 lb

## 2023-05-23 DIAGNOSIS — M47817 Spondylosis without myelopathy or radiculopathy, lumbosacral region: Secondary | ICD-10-CM

## 2023-05-23 DIAGNOSIS — M5442 Lumbago with sciatica, left side: Secondary | ICD-10-CM

## 2023-05-23 DIAGNOSIS — G8929 Other chronic pain: Secondary | ICD-10-CM

## 2023-05-23 DIAGNOSIS — M549 Dorsalgia, unspecified: Secondary | ICD-10-CM | POA: Diagnosis not present

## 2023-05-23 DIAGNOSIS — R0781 Pleurodynia: Secondary | ICD-10-CM

## 2023-05-23 DIAGNOSIS — Z8781 Personal history of (healed) traumatic fracture: Secondary | ICD-10-CM

## 2023-05-23 DIAGNOSIS — M5441 Lumbago with sciatica, right side: Secondary | ICD-10-CM

## 2023-05-23 DIAGNOSIS — M545 Low back pain, unspecified: Secondary | ICD-10-CM

## 2023-05-23 DIAGNOSIS — R413 Other amnesia: Secondary | ICD-10-CM | POA: Diagnosis not present

## 2023-05-23 DIAGNOSIS — M48061 Spinal stenosis, lumbar region without neurogenic claudication: Secondary | ICD-10-CM | POA: Diagnosis not present

## 2023-05-23 DIAGNOSIS — M4856XA Collapsed vertebra, not elsewhere classified, lumbar region, initial encounter for fracture: Secondary | ICD-10-CM | POA: Diagnosis not present

## 2023-05-23 DIAGNOSIS — M4316 Spondylolisthesis, lumbar region: Secondary | ICD-10-CM | POA: Diagnosis not present

## 2023-05-23 DIAGNOSIS — I10 Essential (primary) hypertension: Secondary | ICD-10-CM | POA: Diagnosis not present

## 2023-05-23 DIAGNOSIS — M419 Scoliosis, unspecified: Secondary | ICD-10-CM | POA: Diagnosis not present

## 2023-05-23 DIAGNOSIS — I4819 Other persistent atrial fibrillation: Secondary | ICD-10-CM | POA: Diagnosis not present

## 2023-05-23 DIAGNOSIS — E0590 Thyrotoxicosis, unspecified without thyrotoxic crisis or storm: Secondary | ICD-10-CM | POA: Diagnosis not present

## 2023-05-23 DIAGNOSIS — K219 Gastro-esophageal reflux disease without esophagitis: Secondary | ICD-10-CM | POA: Diagnosis not present

## 2023-05-23 NOTE — Patient Instructions (Signed)
Thank you for coming in today.   Please call [facility] (formally [facility]) at [phone] to schedule your spine injection.    Please get labs today before you leave

## 2023-05-23 NOTE — Progress Notes (Signed)
I, Stevenson Clinch, CMA acting as a scribe for Clementeen Graham, MD.  Bianca Wolf is a 83 y.o. female who presents to [facility] at [facility] today for back pain x 2 months. Pt locates pain to right side mid/lower back and right ribs. Hx of rupture disk L4-L5. Sx are similar in nature. Sx causing night disturbance. Pain with deep breathing. Does not recall recent injury. Feels unbalance. Denies radiating pain into the legs. Minimal relief with tylenol. Sx tend to improve with activity.   Radiating pain: gluteal region LE numbness/tingling: feet LE weakness: yes Aggravates: being sedentary, laying in bed Treatments tried: Tylenol  Pertinent review of systems: No fevers or chills  Relevant historical information: Atrial fibrillation History of vertebral compression fracture.  Exam:  BP (!) 150/92   Pulse 78   Ht 5\' 3"  (1.6 m)   Wt 172 lb (78 kg)   SpO2 95%   BMI 30.47 kg/m  General: Well Developed, well nourished, and in no acute distress.   MSK: L-spine nontender palpation midline.  Tender palpation right lumbar paraspinal musculature.  Decreased lumbar motion.   Right ribs: Tender palpation right inferior lateral ribs margin.  Normal trunk motion.   Lab and Radiology Results  X-ray images right ribs and lumbar spine obtained today personally and independently interpreted.  Right ribs: No acute fractures are visible.  Lumbar spine: Multilevel degenerative changes without acute fractures.  Kyphoplasty cement is visible at L3.  Await formal radiology review    Assessment and Plan: 83 y.o. female with acute exacerbation of chronic right low back pain.  She was seen for the same issue in 2023 culminating in facet injections right L5-S1 facet joint which worked quite well until recently.  Her pain today is located in that same general region.  Plan to start with the procedure that worked the best the last which is the facet injection right L5-S1.  She is  have some right lateral rib pain and does have a history of rib fractures in 2017.  My personal interpretation of today's right rib x-ray does not show acute fractures but radiology overread is still pending.  Consider physical therapy for this in the future if needed.  Recheck a few weeks after her facet injection.   PDMP not reviewed this encounter. Orders Placed This Encounter  Procedures   DG Lumbar Spine 2-3 Views    Standing Status:   Future    Number of Occurrences:   1    Expiration Date:   05/22/2024    Reason for Exam (SYMPTOM  OR DIAGNOSIS REQUIRED):   eval low back pain    Preferred imaging location?:   [facility]   DG Ribs Unilateral W/Chest Right    Standing Status:   Future    Number of Occurrences:   1    Expiration Date:   05/22/2024    Reason for Exam (SYMPTOM  OR DIAGNOSIS REQUIRED):   eval rib pain    Preferred imaging location?:   [facility] FACET JT INJ L /S SINGLE LEVEL RIGHT W/FL/CT    Standing Status:   Future    Expiration Date:   06/21/2023    Reason for Exam (SYMPTOM  OR DIAGNOSIS REQUIRED):   low back pain    Preferred Imaging Location?:   [facility]   No orders of the defined types were placed in this encounter.    Discussed warning signs or symptoms. Please see  discharge instructions. Patient expresses understanding.   The above documentation has been reviewed and is accurate and complete Clementeen Graham, M.D.

## 2023-05-28 ENCOUNTER — Other Ambulatory Visit: Payer: Medicare HMO

## 2023-05-28 NOTE — Discharge Instructions (Signed)

## 2023-05-29 ENCOUNTER — Ambulatory Visit
Admission: RE | Admit: 2023-05-29 | Discharge: 2023-05-29 | Disposition: A | Discharge status: Home / Self Care | Payer: Medicare HMO | Source: Ambulatory Visit | Attending: Family Medicine | Admitting: Family Medicine

## 2023-05-29 DIAGNOSIS — M545 Low back pain, unspecified: Secondary | ICD-10-CM

## 2023-05-29 DIAGNOSIS — Z8781 Personal history of (healed) traumatic fracture: Secondary | ICD-10-CM

## 2023-05-29 DIAGNOSIS — M47817 Spondylosis without myelopathy or radiculopathy, lumbosacral region: Secondary | ICD-10-CM

## 2023-05-29 DIAGNOSIS — G8929 Other chronic pain: Secondary | ICD-10-CM

## 2023-05-29 MED ORDER — IOPAMIDOL (ISOVUE-M 200) INJECTION 41%
1.0000 mL | Freq: Once | INTRAMUSCULAR | Status: AC
  Administered 2023-05-29: 1 mL via INTRA_ARTICULAR

## 2023-05-29 MED ORDER — METHYLPREDNISOLONE ACETATE 40 MG/ML INJ SUSP (RADIOLOG
80.0000 mg | Freq: Once | INTRAMUSCULAR | Status: AC
  Administered 2023-05-29: 80 mg via INTRA_ARTICULAR

## 2023-06-04 NOTE — Progress Notes (Signed)
Lumbar spine x-ray shows no acute fractures.  No new fractures are visible.

## 2023-06-05 NOTE — Progress Notes (Signed)
Right rib x-ray shows healed rib fractures.

## 2023-06-16 ENCOUNTER — Other Ambulatory Visit: Payer: Self-pay | Admitting: Adult Health

## 2023-06-16 DIAGNOSIS — N3000 Acute cystitis without hematuria: Secondary | ICD-10-CM

## 2023-06-17 NOTE — Telephone Encounter (Signed)
 Pt's pharmacy is requesting a refill on medication amoxicillin, which has expired off of pt's medication list. Would Dr. Bjorn Pippin like to refill this medication? Please address

## 2023-06-18 NOTE — Telephone Encounter (Signed)
 Called patient and she verbalized prescription sent to pharmacy for refill was sent in error. Patient verbalizes she does not have any problems with urinating and to disregard the prescription refill. Patient verbalizes she has an appointment for a physical with her PCP next week. Made patient aware to call office for any questions. Patient verbalized an understanding.

## 2023-06-21 ENCOUNTER — Other Ambulatory Visit (HOSPITAL_COMMUNITY): Payer: Self-pay | Admitting: Internal Medicine

## 2023-06-25 DIAGNOSIS — R946 Abnormal results of thyroid function studies: Secondary | ICD-10-CM | POA: Diagnosis not present

## 2023-08-12 ENCOUNTER — Ambulatory Visit: Payer: Medicare HMO

## 2023-08-14 DIAGNOSIS — H40013 Open angle with borderline findings, low risk, bilateral: Secondary | ICD-10-CM | POA: Diagnosis not present

## 2023-08-27 DIAGNOSIS — H40013 Open angle with borderline findings, low risk, bilateral: Secondary | ICD-10-CM | POA: Diagnosis not present

## 2023-08-28 ENCOUNTER — Ambulatory Visit: Payer: Self-pay

## 2023-08-28 NOTE — Telephone Encounter (Signed)
 Copied from CRM [id number]. Topic: Clinical - Red Word Triage >> Aug 28, 2023  1:34 PM Tyronne Galloway wrote: Red Word that prompted transfer to Nurse Triage: New patient not scheduled, mentioned shortness of breath off and on for about a year.  TRIAGE SUMMARY NOTE: Pt is poor historian. Pt reporting that she has been experiencing SOB intermittently for the past year, worsening over past couple weeks especially, SOB with exertion and sometimes at rest, feeling SOB daily, also BP is "up and down," and "kidneys sting" intermittently "like I gotta go pee" and hurting, cannot confirm or deny pain at urethra with urination. Pt reporting she is unable to take BP at this time and not been monitoring. Pt confirms no wheezing or abnormal breath sounds, no chest pain, fever, changes with heart rate, flank pain, or current SOB. Advised pt be examined in next 4 hours for her symptoms, no pulm availability for new pt today, advised call PCP and cardiologist for further recommendations and/or earlier appt, advised UC if no appt available with her docs today, advised ED if any worsening. Scheduled new pt pulm appt for 7/17 with Dr. Diania Fortes per pt preference, no pulm availability in next couple weeks. Confirmed appt/location info. Pt verbalized understanding.  E2C2 Pulmonary Triage - Initial Assessment Questions "Chief Complaint (e.g., cough, sob, wheezing, fever, chills, sweat or additional symptoms) *Go to specific symptom protocol after initial questions. SOB gotten worse, all I can do to get to the car Got lasix  pills, gotta take them more often, holding fluid too, maybe should take them every day SOB with exertion Every day, been feeling this way for while, past couple months Friday going to see doc Been taking BP meds, been up and down benazepril , amiodarone , plavix , metoprolol  No headache, blurred vision dizziness, weakness, chest pain Kidneys sting every once and a while, bladder infections over years, no burning with  urination, just kind of hurting a little bit like I gotta go pee, not flank pain No abnormal breath sounds or wheezing No changes with heart rate Last couple weeks sometimes even before get out of bed get SOB, relax then passes Has to stop every few steps or stairs to catch her breath No SOB right now, sitting at table No hx blood clot  Reason for Disposition  [1] MILD difficulty breathing (e.g., minimal/no SOB at rest, SOB with walking, pulse <100) AND [2] NEW-onset or WORSE than normal  Answer Assessment - Initial Assessment Questions 6. CARDIAC HISTORY: "Do you have any history of heart disease?" (e.g., heart attack, angina, bypass surgery, angioplasty)      significant 7. LUNG HISTORY: "Do you have any history of lung disease?"  (e.g., pulmonary embolus, asthma, emphysema)     significant  Protocols used: Breathing Difficulty-A-AH

## 2023-08-30 ENCOUNTER — Ambulatory Visit: Attending: Student | Admitting: Student

## 2023-08-30 ENCOUNTER — Encounter: Payer: Self-pay | Admitting: Student

## 2023-08-30 VITALS — BP 152/70 | HR 46 | Ht 63.0 in | Wt 171.6 lb

## 2023-08-30 DIAGNOSIS — I480 Paroxysmal atrial fibrillation: Secondary | ICD-10-CM

## 2023-08-30 DIAGNOSIS — Z95818 Presence of other cardiac implants and grafts: Secondary | ICD-10-CM | POA: Diagnosis not present

## 2023-08-30 DIAGNOSIS — Z79899 Other long term (current) drug therapy: Secondary | ICD-10-CM

## 2023-08-30 DIAGNOSIS — I10 Essential (primary) hypertension: Secondary | ICD-10-CM

## 2023-08-30 DIAGNOSIS — Z9189 Other specified personal risk factors, not elsewhere classified: Secondary | ICD-10-CM

## 2023-08-30 NOTE — Progress Notes (Signed)
  Electrophysiology Office Note:   Date:  08/30/2023  ID:  Lanina, [place]utt [birth year], MRN [id number]  Primary Cardiologist: Wendie Hamburg, MD Electrophysiologist: Boyce Byes, MD      History of Present Illness:   RASHEENA TRETTIN is a 83 y.o. female with h/o paroxysmal AF, long term amiodarone  use, and HTN seen today for routine electrophysiology followup.   Since last being seen in our clinic the patient reports doing very well. Overall,  she denies chest pain, palpitations, PND, orthopnea, nausea, vomiting, dizziness, syncope, edema, weight gain, or early satiety.  She has had some slightly progressive dyspnea over the past year. SOB with anything more than ADLs.  Review of systems complete and found to be negative unless listed in HPI.   EP Information / Studies Reviewed:    EKG is ordered today. Personal review as below.  EKG Interpretation Date/Time:  Friday Aug 30 2023 11:09:41 EDT Ventricular Rate:  46 PR Interval:  150 QRS Duration:  86 QT Interval:  480 QTC Calculation: 420 R Axis:   37  Text Interpretation: Sinus bradycardia When compared with ECG of 27-Feb-2023 15:23, Premature ventricular complexes are no longer Present Confirmed by Pilar Bridge [phone]) on 08/30/2023 11:21:31 AM    Arrhythmia/Device History S/p Watchman 02/14/2023   Physical Exam:   VS:  BP (!) 152/70   Pulse (!) 46   Ht 5\' 3"  (1.6 m)   Wt 171 lb 9.6 oz (77.8 kg)   SpO2 96%   BMI 30.40 kg/m    Wt Readings from Last 3 Encounters:  08/30/23 171 lb 9.6 oz (77.8 kg)  05/23/23 172 lb (78 kg)  03/18/23 171 lb (77.6 kg)     GEN: No acute distress NECK: No JVD; No carotid bruits CARDIAC: Regular rate and rhythm, no murmurs, rubs, gallops RESPIRATORY:  Clear to auscultation without rales, wheezing or rhonchi  ABDOMEN: Soft, non-tender, non-distended EXTREMITIES:  No edema; No deformity   ASSESSMENT AND PLAN:    Persistent AF EKG today shows NSR with stable  intervals Continue amiodarone  200 mg daily STOP Plavix  now s/p Watchman 01/2023 CT 04/2023 with no peridevice leak Surveillance labs before  Secondary hypercoagulable state Off OAC now s/p LAAO as above  HTN Stable on current regimen   DOE Likely multifactorial, including potential amiodarone  side effects She is scheduled to establish with Pulmonology in July. Will defer work up to pulmonary, given normal EF on prior TEE.  ? Need for High res CT. Likely at least PFTs.  Follow up with EP APP in 6 months  Signed, Tylene Galla, PA-C

## 2023-08-30 NOTE — Patient Instructions (Signed)
 Medication Instructions:  Stop plavix  *If you need a refill on your cardiac medications before your next appointment, please call your pharmacy*  Lab Work: CMET, TSH, FreeT4-TODAY If you have labs (blood work) drawn today and your tests are completely normal, you will receive your results only by: MyChart Message (if you have MyChart) OR A paper copy in the mail If you have any lab test that is abnormal or we need to change your treatment, we will call you to review the results.  Follow-Up: At [facility], you and your health needs are our priority.  As part of our continuing mission to provide you with exceptional heart care, our providers are all part of one team.  This team includes your primary Cardiologist (physician) and Advanced Practice Providers or APPs (Physician Assistants and Nurse Practitioners) who all work together to provide you with the care you need, when you need it.  Your next appointment:   6 month(s)  Provider:   You may see Boyce Byes, MD or one of the following Advanced Practice Providers on your designated Care Team:   Mertha Abrahams, [place] Joycelyn Noa" [place], [place] Creighton Doffing, NP   We recommend signing up for the patient portal called "MyChart".  Sign up information is provided on this After Visit Summary.  MyChart is used to connect with patients for Virtual Visits (Telemedicine).  Patients are able to view lab/test results, encounter notes, upcoming appointments, etc.  Non-urgent messages can be sent to your provider as well.   To learn more about what you can do with MyChart, go to [email or web address].

## 2023-08-31 LAB — COMPREHENSIVE METABOLIC PANEL WITH GFR
ALT: 8 IU/L (ref 0–32)
AST: 15 IU/L (ref 0–40)
Albumin: 4.3 g/dL (ref 3.7–4.7)
Alkaline Phosphatase: 73 IU/L (ref 44–121)
BUN/Creatinine Ratio: 15 (ref 12–28)
BUN: 16 mg/dL (ref 8–27)
Bilirubin Total: 0.4 mg/dL (ref 0.0–1.2)
CO2: 27 mmol/L (ref 20–29)
Calcium: 9.8 mg/dL (ref 8.7–10.3)
Chloride: 97 mmol/L (ref 96–106)
Creatinine, Ser: 1.08 mg/dL — ABNORMAL HIGH (ref 0.57–1.00)
Globulin, Total: 3.1 g/dL (ref 1.5–4.5)
Glucose: 90 mg/dL (ref 70–99)
Potassium: 5.1 mmol/L (ref 3.5–5.2)
Sodium: 140 mmol/L (ref 134–144)
Total Protein: 7.4 g/dL (ref 6.0–8.5)
eGFR: 51 mL/min/{1.73_m2} — ABNORMAL LOW (ref >59)

## 2023-08-31 LAB — TSH: TSH: 4.33 u[IU]/mL (ref 0.450–4.500)

## 2023-08-31 LAB — T4, FREE: Free T4: 1.23 ng/dL (ref 0.82–1.77)

## 2023-09-17 ENCOUNTER — Other Ambulatory Visit: Payer: Self-pay | Admitting: Cardiology

## 2023-09-26 ENCOUNTER — Ambulatory Visit (HOSPITAL_COMMUNITY): Payer: Medicare HMO | Admitting: Internal Medicine

## 2023-10-16 DIAGNOSIS — M79672 Pain in left foot: Secondary | ICD-10-CM | POA: Diagnosis not present

## 2023-10-16 DIAGNOSIS — L0390 Cellulitis, unspecified: Secondary | ICD-10-CM | POA: Diagnosis not present

## 2023-10-22 DIAGNOSIS — L0390 Cellulitis, unspecified: Secondary | ICD-10-CM | POA: Diagnosis not present

## 2023-11-06 DIAGNOSIS — I10 Essential (primary) hypertension: Secondary | ICD-10-CM | POA: Diagnosis not present

## 2023-11-07 ENCOUNTER — Encounter: Payer: Self-pay | Admitting: Pulmonary Disease

## 2023-11-07 ENCOUNTER — Ambulatory Visit: Admitting: Pulmonary Disease

## 2023-11-07 VITALS — BP 189/66 | HR 52 | Ht 63.0 in | Wt 166.6 lb

## 2023-11-07 DIAGNOSIS — J432 Centrilobular emphysema: Secondary | ICD-10-CM | POA: Diagnosis not present

## 2023-11-07 DIAGNOSIS — R0602 Shortness of breath: Secondary | ICD-10-CM

## 2023-11-07 DIAGNOSIS — I4891 Unspecified atrial fibrillation: Secondary | ICD-10-CM | POA: Diagnosis not present

## 2023-11-07 DIAGNOSIS — Z87891 Personal history of nicotine dependence: Secondary | ICD-10-CM

## 2023-11-07 MED ORDER — SPIRIVA RESPIMAT 2.5 MCG/ACT IN AERS: 2.0000 | INHALATION_SPRAY | Freq: Every day | RESPIRATORY_TRACT | 5 refills | Status: DC

## 2023-11-07 MED ORDER — ALBUTEROL SULFATE HFA 108 (90 BASE) MCG/ACT IN AERS: 2.0000 | INHALATION_SPRAY | Freq: Four times a day (QID) | RESPIRATORY_TRACT | 6 refills | Status: AC | PRN

## 2023-11-07 MED ORDER — SPIRIVA RESPIMAT 2.5 MCG/ACT IN AERS: INHALATION_SPRAY | RESPIRATORY_TRACT | Status: DC

## 2023-11-07 NOTE — Addendum Note (Signed)
 Addended byBETHA JESSICA BOUCHARD S on: 11/07/2023 03:09 PM   Modules accepted: Orders

## 2023-11-07 NOTE — Progress Notes (Signed)
 Synopsis: Referred in July 2024 for Shortness of breath  Subjective:   PATIENT ID: Bianca Wolf GENDER: female DOB: December 20, 1940, MRN: [id number]   HPI  Chief Complaint  Patient presents with   Consult    Consult to establish care. SOB with exertion and at rest.   Bianca Wolf is an 83 year old woman, former smoker with history of atrial fibrillation s/p ablation and watchman procedure, GERD and hypertension who is referred to pulmonary clinic for shortness of breath.  She experiences shortness of breath with minimal exertion, such as standing or walking into a store, and often uses a shopping cart for support. There is associated leg swelling and occasional coughing, attributed to mouth breathing and throat dryness. No wheezing is present, but there is some congestion.  She has a significant smoking history, having quit in 1979 after smoking for approximately 25 years. She was exposed to secondhand smoke during her upbringing and worked in a [employer] with dust exposure. She denies current smoking or use of e-cigarettes. She has not used inhalers recently but recalls past use without improvement.  She experiences sleep disturbances, waking with a dry mouth and drooling, and occasionally wakes herself with snorting sounds. She is unsure if she snores and has not undergone a sleep study, though family members have sleep apnea.  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 Cindie     Family History  Problem Relation Age of Onset   Hypertension Mother    Diabetes Mother    Heart disease Father    Hypertension Father    Atrial fibrillation Neg Hx      Social History   Socioeconomic History   Marital status: Widowed    Spouse name: Not on file   Number of children: Not on file    Years of education: Not on file   Highest education level: Not on file  Occupational History   Not on file  Tobacco Use   Smoking status: Former    Current packs/day: 0.00    Average packs/day: 1 pack/day for 25.0 years (25.0 ttl pk-yrs)    Types: Cigarettes    Start date: 65    Quit date: 67    Years since quitting: 45.5   Smokeless tobacco: Never  Vaping Use   Vaping status: Never Used  Substance and Sexual Activity   Alcohol use: No   Drug use: No   Sexual activity: Yes    Birth control/protection: None  Other Topics Concern   Not on file  Social History Narrative   Not on file   Social Drivers of Health   Financial Resource Strain: Not on file  Food Insecurity: Not on file  Transportation Needs: Not on file  Physical Activity: Not on file  Stress: Not on file  Social Connections: Not on file  Intimate Partner Violence: Not on file     No Known Allergies   Outpatient Medications Prior to Visit  Medication Sig Dispense Refill   acetaminophen  (TYLENOL ) 325 MG tablet Take 2 tablets (650 mg total) by mouth every 6 (six) hours as needed. (Patient taking differently: Take 650 mg by mouth as needed.) 30 tablet 0   amiodarone  (PACERONE ) 200 MG tablet Take 1 tablet (200 mg total) by mouth daily. 90 tablet  2   benazepril  (LOTENSIN ) 20 MG tablet Take 10 mg by mouth in the morning.     Calcium Citrate-Vitamin D  (CITRACAL + D PO) Take 1 tablet by mouth every morning.     cyanocobalamin  2000 MCG tablet Take 2,000 mcg by mouth daily.     estradiol (ESTRACE) 0.1 MG/GM vaginal cream Place 1 Applicatorful vaginally as needed.     furosemide  (LASIX ) 20 MG tablet TAKE 1 TABLET BY MOUTH ONCE DAILY AS NEEDED FOR  WEIGHT  INCREASE  OF  3LBS  OVERNIGHT  OR  5LBS  IN  1  WEEK 90 tablet 1   metoprolol  succinate (TOPROL -XL) 25 MG 24 hr tablet Take 1 tablet (25 mg total) by mouth daily. Take with or immediately following a meal. 90 tablet 3   No facility-administered medications prior  to visit.   Review of Systems  Constitutional:  Negative for chills, fever, malaise/fatigue and weight loss.  HENT:  Negative for congestion, sinus pain and sore throat.   Eyes: Negative.   Respiratory:  Positive for shortness of breath. Negative for cough, hemoptysis, sputum production and wheezing.   Cardiovascular:  Positive for leg swelling. Negative for chest pain, palpitations, orthopnea and claudication.  Gastrointestinal:  Negative for abdominal pain, heartburn, nausea and vomiting.  Genitourinary: Negative.   Musculoskeletal:  Negative for joint pain and myalgias.  Skin:  Negative for rash.  Neurological:  Negative for weakness.  Endo/Heme/Allergies: Negative.   Psychiatric/Behavioral: Negative.     Objective:   Vitals:   11/07/23 1350  BP: (!) 189/66  Pulse: (!) 52  SpO2: 91%  Weight: 166 lb 9.6 oz (75.6 kg)  Height: 5' 3 (1.6 m)   Physical Exam Constitutional:      General: She is not in acute distress.    Appearance: Normal appearance.  Eyes:     General: No scleral icterus.    Conjunctiva/sclera: Conjunctivae normal.  Cardiovascular:     Rate and Rhythm: Normal rate and regular rhythm.  Pulmonary:     Breath sounds: No wheezing, rhonchi or rales.  Musculoskeletal:     Right lower leg: No edema.     Left lower leg: No edema.  Skin:    General: Skin is warm and dry.  Neurological:     General: No focal deficit present.    CBC    Component Value Date/Time   WBC 6.8 02/08/2023 0959   WBC 7.7 09/20/2022 1156   RBC 4.25 02/08/2023 0959   RBC 4.04 09/20/2022 1156   HGB 12.0 02/08/2023 0959   HCT 39.2 02/08/2023 0959   PLT 220 02/08/2023 0959   MCV 92 02/08/2023 0959   MCH 28.2 02/08/2023 0959   MCH 29.2 09/20/2022 1156   MCHC 30.6 (L) 02/08/2023 0959   MCHC 31.6 09/20/2022 1156   RDW 13.0 02/08/2023 0959   LYMPHSABS 1.4 02/10/2022 0232   MONOABS 0.7 02/10/2022 0232   EOSABS 0.1 02/10/2022 0232   BASOSABS 0.0 02/10/2022 0232      Latest Ref  Rng & Units 08/30/2023   12:01 PM 03/18/2023   10:38 AM 02/27/2023    3:15 PM  BMP  Glucose 70 - 99 mg/dL 90  80  97   BUN 8 - 27 mg/dL 16  15  6    Creatinine 0.57 - 1.00 mg/dL 8.91  9.02  9.01   BUN/Creat Ratio 12 - 28 15  15     Sodium 134 - 144 mmol/L 140  139  141   Potassium 3.5 -  5.2 mmol/L 5.1  4.4  4.5   Chloride 96 - 106 mmol/L 97  99  103   CO2 20 - 29 mmol/L 27  26  26    Calcium 8.7 - 10.3 mg/dL 9.8  9.9  9.4    Chest imaging: CT Chest 11/26/22 Cardiovascular: Pulmonary artery is prominent suggestive of pulmonary hypertension.   Mediastinum/Nodes: There are no enlarged lymph nodes within the visualized mediastinum.   Lungs/Pleura: Emphysematous changes are noted. No focal infiltrate is seen. No sizable effusion is noted.  PFT:     No data to display          Labs:  Path:  Echo: LV EF 60-65%. RV size and function is normal. RA mildly dilated. Evidence of atrial level shunting on color flow doppler.  Heart Catheterization:    Assessment & Plan:   Centrilobular emphysema (HCC) - Plan: Tiotropium Bromide Monohydrate  (SPIRIVA  RESPIMAT) 2.5 MCG/ACT AERS, albuterol  (VENTOLIN  HFA) 108 (90 Base) MCG/ACT inhaler  Shortness of breath - Plan: Pulmonary Function Test, CT CHEST HIGH RESOLUTION  Former smoker  Atrial fibrillation, unspecified type (HCC)  Discussion: Bianca Wolf is an 83 year old woman, former smoker with history of atrial fibrillation s/p ablation and watchman procedure, GERD and hypertension who is referred to pulmonary clinic for shortness of breath.  Emphysema Chronic dyspnea likely due to emphysema, with history of secondhand smoke exposure, brief smoking, and occupational exposure. Differential includes amiodarone  lung toxicity, though not evident on previous scans. - Order high-resolution CT scan to evaluate lung changes and potential amiodarone  toxicity. - Order pulmonary function tests. - Prescribe Spiriva  inhaler with usage instructions. -  Prescribe albuterol  rescue inhaler for acute symptoms. - Evaluate inhaler response before considering sleep study.  Atrial Fibrillation Managed with Watchman procedure and amiodarone . Monitoring for amiodarone  lung toxicity, no evidence on previous scans. - Verify current amiodarone  use. - Continue monitoring for amiodarone -related lung toxicity.  Snoring - concern for sleep apnea, will consider sleep test after trial of inhaler therapy  Follow up in 2 months  Dorn Chill, MD [facility] Office: [phone]   Current Outpatient Medications:    acetaminophen  (TYLENOL ) 325 MG tablet, Take 2 tablets (650 mg total) by mouth every 6 (six) hours as needed. (Patient taking differently: Take 650 mg by mouth as needed.), Disp: 30 tablet, Rfl: 0   albuterol  (VENTOLIN  HFA) 108 (90 Base) MCG/ACT inhaler, Inhale 2 puffs into the lungs every 6 (six) hours as needed for wheezing or shortness of breath., Disp: 8 g, Rfl: 6   amiodarone  (PACERONE ) 200 MG tablet, Take 1 tablet (200 mg total) by mouth daily., Disp: 90 tablet, Rfl: 2   benazepril  (LOTENSIN ) 20 MG tablet, Take 10 mg by mouth in the morning., Disp: , Rfl:    Calcium Citrate-Vitamin D  (CITRACAL + D PO), Take 1 tablet by mouth every morning., Disp: , Rfl:    cyanocobalamin  2000 MCG tablet, Take 2,000 mcg by mouth daily., Disp: , Rfl:    estradiol (ESTRACE) 0.1 MG/GM vaginal cream, Place 1 Applicatorful vaginally as needed., Disp: , Rfl:    furosemide  (LASIX ) 20 MG tablet, TAKE 1 TABLET BY MOUTH ONCE DAILY AS NEEDED FOR  WEIGHT  INCREASE  OF  3LBS  OVERNIGHT  OR  5LBS  IN  1  WEEK, Disp: 90 tablet, Rfl: 1   metoprolol  succinate (TOPROL -XL) 25 MG 24 hr tablet, Take 1 tablet (25 mg total) by mouth daily. Take with or immediately following a meal., Disp: 90 tablet,  Rfl: 3   Tiotropium Bromide Monohydrate  (SPIRIVA  RESPIMAT) 2.5 MCG/ACT AERS, Inhale 2 puffs into the lungs daily., Disp: 1 g, Rfl: 5

## 2023-11-07 NOTE — Patient Instructions (Addendum)
 Your CT scans from last year show concern for Emphysema  Start spiriva  2.5mcg 2 puffs twice daily  Use albuterol  inhaler as needed  We will schedule you for CT Chest scan  Schedule pulmonary function tests at the front desk  Follow up in 2 months

## 2023-11-11 ENCOUNTER — Telehealth: Payer: Self-pay | Admitting: Pulmonary Disease

## 2023-11-11 DIAGNOSIS — Z0000 Encounter for general adult medical examination without abnormal findings: Secondary | ICD-10-CM | POA: Diagnosis not present

## 2023-11-11 DIAGNOSIS — J432 Centrilobular emphysema: Secondary | ICD-10-CM | POA: Diagnosis not present

## 2023-11-11 DIAGNOSIS — K219 Gastro-esophageal reflux disease without esophagitis: Secondary | ICD-10-CM | POA: Diagnosis not present

## 2023-11-11 DIAGNOSIS — E0590 Thyrotoxicosis, unspecified without thyrotoxic crisis or storm: Secondary | ICD-10-CM | POA: Diagnosis not present

## 2023-11-11 DIAGNOSIS — J45909 Unspecified asthma, uncomplicated: Secondary | ICD-10-CM | POA: Diagnosis not present

## 2023-11-11 DIAGNOSIS — M810 Age-related osteoporosis without current pathological fracture: Secondary | ICD-10-CM | POA: Diagnosis not present

## 2023-11-11 DIAGNOSIS — I10 Essential (primary) hypertension: Secondary | ICD-10-CM | POA: Diagnosis not present

## 2023-11-11 DIAGNOSIS — N3281 Overactive bladder: Secondary | ICD-10-CM | POA: Diagnosis not present

## 2023-11-11 DIAGNOSIS — I4819 Other persistent atrial fibrillation: Secondary | ICD-10-CM | POA: Diagnosis not present

## 2023-11-11 NOTE — Telephone Encounter (Signed)
 Patient is here dropping of forms to be filled out by Dr.Dewald to help her with her insurance payments. Please fax back to [phone]. It will be placed in Dr.Dewald's box.

## 2023-11-12 NOTE — Telephone Encounter (Signed)
 Noted paper work has been received will be signed and fax when JD back in clinic

## 2023-11-21 ENCOUNTER — Ambulatory Visit (HOSPITAL_BASED_OUTPATIENT_CLINIC_OR_DEPARTMENT_OTHER)

## 2023-11-25 ENCOUNTER — Ambulatory Visit (HOSPITAL_BASED_OUTPATIENT_CLINIC_OR_DEPARTMENT_OTHER)

## 2023-12-02 ENCOUNTER — Ambulatory Visit (HOSPITAL_BASED_OUTPATIENT_CLINIC_OR_DEPARTMENT_OTHER)
Admission: RE | Admit: 2023-12-02 | Discharge: 2023-12-02 | Disposition: A | Discharge status: Home / Self Care | Source: Ambulatory Visit | Attending: Pulmonary Disease | Admitting: Pulmonary Disease

## 2023-12-02 DIAGNOSIS — R0602 Shortness of breath: Secondary | ICD-10-CM | POA: Diagnosis not present

## 2023-12-02 DIAGNOSIS — R918 Other nonspecific abnormal finding of lung field: Secondary | ICD-10-CM | POA: Diagnosis not present

## 2023-12-02 DIAGNOSIS — J432 Centrilobular emphysema: Secondary | ICD-10-CM | POA: Diagnosis not present

## 2023-12-07 ENCOUNTER — Ambulatory Visit: Payer: Self-pay | Admitting: Pulmonary Disease

## 2023-12-09 NOTE — Telephone Encounter (Signed)
 Patient verbalized understanding    NFN-

## 2023-12-16 DIAGNOSIS — N390 Urinary tract infection, site not specified: Secondary | ICD-10-CM | POA: Diagnosis not present

## 2023-12-16 DIAGNOSIS — R300 Dysuria: Secondary | ICD-10-CM | POA: Diagnosis not present

## 2023-12-27 DIAGNOSIS — E0590 Thyrotoxicosis, unspecified without thyrotoxic crisis or storm: Secondary | ICD-10-CM | POA: Diagnosis not present

## 2023-12-30 DIAGNOSIS — L821 Other seborrheic keratosis: Secondary | ICD-10-CM | POA: Diagnosis not present

## 2023-12-31 DIAGNOSIS — I4819 Other persistent atrial fibrillation: Secondary | ICD-10-CM | POA: Diagnosis not present

## 2023-12-31 DIAGNOSIS — R531 Weakness: Secondary | ICD-10-CM | POA: Diagnosis not present

## 2023-12-31 DIAGNOSIS — I10 Essential (primary) hypertension: Secondary | ICD-10-CM | POA: Diagnosis not present

## 2024-01-07 ENCOUNTER — Other Ambulatory Visit (HOSPITAL_COMMUNITY): Payer: Self-pay

## 2024-01-14 DIAGNOSIS — M6281 Muscle weakness (generalized): Secondary | ICD-10-CM | POA: Diagnosis not present

## 2024-01-20 ENCOUNTER — Ambulatory Visit: Admitting: Pulmonary Disease

## 2024-01-20 ENCOUNTER — Encounter: Payer: Self-pay | Admitting: Pulmonary Disease

## 2024-01-20 ENCOUNTER — Ambulatory Visit

## 2024-01-20 VITALS — BP 195/64 | HR 53

## 2024-01-20 DIAGNOSIS — M6281 Muscle weakness (generalized): Secondary | ICD-10-CM | POA: Diagnosis not present

## 2024-01-20 DIAGNOSIS — R0602 Shortness of breath: Secondary | ICD-10-CM | POA: Diagnosis not present

## 2024-01-20 DIAGNOSIS — Z87891 Personal history of nicotine dependence: Secondary | ICD-10-CM

## 2024-01-20 DIAGNOSIS — J432 Centrilobular emphysema: Secondary | ICD-10-CM

## 2024-01-20 LAB — PULMONARY FUNCTION TEST
DL/VA % pred: 65 %
DL/VA: 2.69 ml/min/mmHg/L
DLCO unc % pred: 48 %
DLCO unc: 8.74 ml/min/mmHg
FEF 25-75 Post: 0.47 L/s
FEF 25-75 Pre: 0.32 L/s
FEF2575-%Change-Post: 48 %
FEF2575-%Pred-Post: 40 %
FEF2575-%Pred-Pre: 27 %
FEV1-%Change-Post: 15 %
FEV1-%Pred-Post: 50 %
FEV1-%Pred-Pre: 43 %
FEV1-Post: 0.85 L
FEV1-Pre: 0.74 L
FEV1FVC-%Change-Post: -1 %
FEV1FVC-%Pred-Pre: 64 %
FEV6-%Change-Post: 19 %
FEV6-%Pred-Post: 81 %
FEV6-%Pred-Pre: 68 %
FEV6-Post: 1.77 L
FEV6-Pre: 1.49 L
FEV6FVC-%Change-Post: 0 %
FEV6FVC-%Pred-Post: 102 %
FEV6FVC-%Pred-Pre: 102 %
FVC-%Change-Post: 17 %
FVC-%Pred-Post: 79 %
FVC-%Pred-Pre: 67 %
FVC-Post: 1.84 L
FVC-Pre: 1.56 L
Post FEV1/FVC ratio: 46 %
Post FEV6/FVC ratio: 97 %
Pre FEV1/FVC ratio: 47 %
Pre FEV6/FVC Ratio: 96 %
RV % pred: 145 %
RV: 3.52 L
TLC % pred: 112 %
TLC: 5.52 L

## 2024-01-20 MED ORDER — STIOLTO RESPIMAT 2.5-2.5 MCG/ACT IN AERS: 2.0000 | INHALATION_SPRAY | Freq: Every day | RESPIRATORY_TRACT | 5 refills | Status: DC

## 2024-01-20 NOTE — Progress Notes (Signed)
 Full pft performed today

## 2024-01-20 NOTE — Patient Instructions (Signed)
 Full pft performed today

## 2024-01-20 NOTE — Patient Instructions (Addendum)
 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   Follow up in 4 months

## 2024-01-20 NOTE — Progress Notes (Signed)
 Synopsis: Referred in July 2024 for Shortness of breath  Subjective:   PATIENT ID: Bianca Wolf GENDER: female DOB: [birth year], MRN: [id number]   HPI  Chief Complaint  Patient presents with   Medical Management of Chronic Issues    Post PFT    Bianca Wolf is an 83 year old woman, former smoker with history of atrial fibrillation s/p ablation and watchman procedure, GERD and hypertension who returns to pulmonary clinic for COPD.  Initial OV 11/07/23 She experiences shortness of breath with minimal exertion, such as standing or walking into a store, and often uses a shopping cart for support. There is associated leg swelling and occasional coughing, attributed to mouth breathing and throat dryness. No wheezing is present, but there is some congestion.  She has a significant smoking history, having quit in 1979 after smoking for approximately 25 years. She was exposed to secondhand smoke during her upbringing and worked in a [employer] with dust exposure. She denies current smoking or use of e-cigarettes. She has not used inhalers recently but recalls past use without improvement.  She experiences sleep disturbances, waking with a dry mouth and drooling, and occasionally wakes herself with snorting sounds. She is unsure if she snores and has not undergone a sleep study, though family members have sleep apnea.  OV 01/20/24  PFTs show moderately severe obstruction with significant bronchodilator response, hyperinflation and moderate diffusion defect.   Reviewed how her dyspnea is most likely from her COPD.   She did not note much benefit from Spiriva . Discussed other inhaler options and trying Stiolto next. Reviewed inhaler technique.  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 Cindie      Family History  Problem Relation Age of Onset   Hypertension Mother    Diabetes Mother    Heart disease Father    Hypertension Father    Atrial fibrillation Neg Hx      Social History   Socioeconomic History   Marital status: Widowed    Spouse name: Not on file   Number of children: Not on file   Years of education: Not on file   Highest education level: Not on file  Occupational History   Not on file  Tobacco Use   Smoking status: Former    Current packs/day: 0.00    Average packs/day: 1 pack/day for 25.0 years (25.0 ttl pk-yrs)    Types: Cigarettes    Start date: 23    Quit date: 75    Years since quitting: 45.7   Smokeless tobacco: Never  Vaping Use   Vaping status: Never Used  Substance and Sexual Activity   Alcohol use: No   Drug use: No   Sexual activity: Yes    Birth control/protection: None  Other Topics Concern   Not on file  Social History Narrative   Not on file   Social Drivers of Health   Financial Resource Strain: Not on file  Food Insecurity: Not on file  Transportation Needs: Not on file  Physical Activity: Not on file  Stress: Not on file  Social Connections: Not on file  Intimate Partner Violence: Not on file     No Known Allergies   Outpatient Medications Prior to Visit  Medication Sig Dispense Refill   acetaminophen  (TYLENOL )  325 MG tablet Take 2 tablets (650 mg total) by mouth every 6 (six) hours as needed. (Patient taking differently: Take 650 mg by mouth as needed.) 30 tablet 0   albuterol  (VENTOLIN  HFA) 108 (90 Base) MCG/ACT inhaler Inhale 2 puffs into the lungs every 6 (six) hours as needed for wheezing or shortness of breath. 8 g 6   amiodarone  (PACERONE ) 200 MG tablet Take 1 tablet (200 mg total) by mouth daily. 90 tablet 2   benazepril  (LOTENSIN ) 20 MG tablet Take 10 mg by mouth in the morning.     Calcium Citrate-Vitamin D  (CITRACAL + D PO) Take 1 tablet by mouth every morning.     cyanocobalamin  2000 MCG tablet Take  2,000 mcg by mouth daily.     estradiol (ESTRACE) 0.1 MG/GM vaginal cream Place 1 Applicatorful vaginally as needed.     furosemide  (LASIX ) 20 MG tablet TAKE 1 TABLET BY MOUTH ONCE DAILY AS NEEDED FOR  WEIGHT  INCREASE  OF  3LBS  OVERNIGHT  OR  5LBS  IN  1  WEEK 90 tablet 1   metoprolol  succinate (TOPROL -XL) 25 MG 24 hr tablet Take 1 tablet (25 mg total) by mouth daily. Take with or immediately following a meal. 90 tablet 3   Tiotropium Bromide Monohydrate  (SPIRIVA  RESPIMAT) 2.5 MCG/ACT AERS Inhale 2 puffs into the lungs daily. 1 g 5   Tiotropium Bromide Monohydrate  (SPIRIVA  RESPIMAT) 2.5 MCG/ACT AERS 2 samples     No facility-administered medications prior to visit.   Review of Systems  Constitutional:  Negative for chills, fever, malaise/fatigue and weight loss.  HENT:  Negative for congestion, sinus pain and sore throat.   Eyes: Negative.   Respiratory:  Positive for shortness of breath. Negative for cough, hemoptysis, sputum production and wheezing.   Cardiovascular:  Negative for chest pain, palpitations, orthopnea, claudication and leg swelling.  Gastrointestinal:  Negative for abdominal pain, heartburn, nausea and vomiting.  Genitourinary: Negative.   Musculoskeletal:  Negative for joint pain and myalgias.  Skin:  Negative for rash.  Neurological:  Negative for weakness.  Endo/Heme/Allergies: Negative.   Psychiatric/Behavioral: Negative.     Objective:   Vitals:   01/20/24 1514  BP: (!) 195/64  Pulse: (!) 53  SpO2: 95%    Physical Exam Constitutional:      General: She is not in acute distress.    Appearance: Normal appearance.  Eyes:     General: No scleral icterus.    Conjunctiva/sclera: Conjunctivae normal.  Cardiovascular:     Rate and Rhythm: Normal rate and regular rhythm.  Pulmonary:     Breath sounds: No wheezing, rhonchi or rales.  Musculoskeletal:     Right lower leg: No edema.     Left lower leg: No edema.  Skin:    General: Skin is warm and dry.   Neurological:     General: No focal deficit present.    CBC    Component Value Date/Time   WBC 6.8 02/08/2023 0959   WBC 7.7 09/20/2022 1156   RBC 4.25 02/08/2023 0959   RBC 4.04 09/20/2022 1156   HGB 12.0 02/08/2023 0959   HCT 39.2 02/08/2023 0959   PLT 220 02/08/2023 0959   MCV 92 02/08/2023 0959   MCH 28.2 02/08/2023 0959   MCH 29.2 09/20/2022 1156   MCHC 30.6 (L) 02/08/2023 0959   MCHC 31.6 09/20/2022 1156   RDW 13.0 02/08/2023 0959   LYMPHSABS 1.4 02/10/2022 0232   MONOABS 0.7 02/10/2022 0232   EOSABS 0.1  02/10/2022 0232   BASOSABS 0.0 02/10/2022 0232      Latest Ref Rng & Units 08/30/2023   12:01 PM 03/18/2023   10:38 AM 02/27/2023    3:15 PM  BMP  Glucose 70 - 99 mg/dL 90  80  97   BUN 8 - 27 mg/dL 16  15  6    Creatinine 0.57 - 1.00 mg/dL 8.91  9.02  9.01   BUN/Creat Ratio 12 - 28 15  15     Sodium 134 - 144 mmol/L 140  139  141   Potassium 3.5 - 5.2 mmol/L 5.1  4.4  4.5   Chloride 96 - 106 mmol/L 97  99  103   CO2 20 - 29 mmol/L 27  26  26    Calcium 8.7 - 10.3 mg/dL 9.8  9.9  9.4    Chest imaging: HRCT Chest 12/02/23 1. No evidence of interstitial lung disease or amiodarone  toxicity. 2. 4 mm left upper lobe nodule. Although likely benign, if the patient is high-risk, given the morphology and/or location of this nodule a non-contrast chest CT can be considered in 12 months.This recommendation follows the consensus statement: Guidelines for Management of Incidental Pulmonary Nodules Detected on CT Images: From the Fleischner Society 2017; Radiology 2017; 284:228-243. 3. Aortic atherosclerosis (ICD10-I70.0). Coronary artery calcification. 4. Markedly enlarged right pulmonary artery. 5.  Emphysema (ICD10-J43.9).  CT Chest 11/26/22 Cardiovascular: Pulmonary artery is prominent suggestive of pulmonary hypertension.   Mediastinum/Nodes: There are no enlarged lymph nodes within the visualized mediastinum.   Lungs/Pleura: Emphysematous changes are noted. No  focal infiltrate is seen. No sizable effusion is noted.  PFT:    Latest Ref Rng & Units 01/20/2024    1:24 PM  PFT Results  FVC-Pre L 1.56  P  FVC-Predicted Pre % 67  P  FVC-Post L 1.84  P  FVC-Predicted Post % 79  P  Pre FEV1/FVC % % 47  P  Post FEV1/FCV % % 46  P  FEV1-Pre L 0.74  P  FEV1-Predicted Pre % 43  P  FEV1-Post L 0.85  P  DLCO uncorrected ml/min/mmHg 8.74  P  DLCO UNC% % 48  P  DLVA Predicted % 65  P  TLC L 5.52  P  TLC % Predicted % 112  P  RV % Predicted % 145  P    P Preliminary result    Labs:  Path:  Echo: LV EF 60-65%. RV size and function is normal. RA mildly dilated. Evidence of atrial level shunting on color flow doppler.  Heart Catheterization:    Assessment & Plan:   Centrilobular emphysema (HCC) - Plan: Tiotropium Bromide-Olodaterol (STIOLTO RESPIMAT) 2.5-2.5 MCG/ACT AERS, Pulse oximetry, overnight  Discussion: Bianca Wolf is an 83 year old woman, former smoker with history of atrial fibrillation s/p ablation and watchman procedure, GERD and hypertension who returns to pulmonary clinic for COPD.  COPD/Emphysema PFTs show moderately severe obstruction with significant bronchodilator response, air trapping and moderate diffusion defect - HRCT chest without concern for amiodarone  toxicity - start stiolto inhaler 2 puffs daily - Will consider triple inhaler therapy in future if no benefit from stiolto - Consider adding ohtuvayre  nebs in the future as well - As needed albuterol  - check overnight oxygen  test on room air  Follow up in 4 months  Dorn Chill, MD [facility] Office: [phone]   Current Outpatient Medications:    acetaminophen  (TYLENOL ) 325 MG tablet, Take 2 tablets (650 mg total) by mouth every  6 (six) hours as needed. (Patient taking differently: Take 650 mg by mouth as needed.), Disp: 30 tablet, Rfl: 0   albuterol  (VENTOLIN  HFA) 108 (90 Base) MCG/ACT inhaler, Inhale 2 puffs into the lungs every  6 (six) hours as needed for wheezing or shortness of breath., Disp: 8 g, Rfl: 6   amiodarone  (PACERONE ) 200 MG tablet, Take 1 tablet (200 mg total) by mouth daily., Disp: 90 tablet, Rfl: 2   benazepril  (LOTENSIN ) 20 MG tablet, Take 10 mg by mouth in the morning., Disp: , Rfl:    Calcium Citrate-Vitamin D  (CITRACAL + D PO), Take 1 tablet by mouth every morning., Disp: , Rfl:    cyanocobalamin  2000 MCG tablet, Take 2,000 mcg by mouth daily., Disp: , Rfl:    estradiol (ESTRACE) 0.1 MG/GM vaginal cream, Place 1 Applicatorful vaginally as needed., Disp: , Rfl:    furosemide  (LASIX ) 20 MG tablet, TAKE 1 TABLET BY MOUTH ONCE DAILY AS NEEDED FOR  WEIGHT  INCREASE  OF  3LBS  OVERNIGHT  OR  5LBS  IN  1  WEEK, Disp: 90 tablet, Rfl: 1   metoprolol  succinate (TOPROL -XL) 25 MG 24 hr tablet, Take 1 tablet (25 mg total) by mouth daily. Take with or immediately following a meal., Disp: 90 tablet, Rfl: 3   Tiotropium Bromide-Olodaterol (STIOLTO RESPIMAT) 2.5-2.5 MCG/ACT AERS, Inhale 2 puffs into the lungs daily., Disp: 4 g, Rfl: 5

## 2024-01-21 ENCOUNTER — Ambulatory Visit: Payer: Self-pay | Admitting: Internal Medicine

## 2024-01-21 ENCOUNTER — Other Ambulatory Visit (HOSPITAL_COMMUNITY): Payer: Self-pay

## 2024-01-21 MED ORDER — STIOLTO RESPIMAT 2.5-2.5 MCG/ACT IN AERS: 2.0000 | INHALATION_SPRAY | Freq: Every day | RESPIRATORY_TRACT | 5 refills | Status: AC

## 2024-01-21 NOTE — Telephone Encounter (Signed)
 Copied from CRM #[id number]. Topic: Clinical - Prescription Issue >> Jan 21, 2024 11:40 AM Lavanda D wrote: Reason for CRM: Patient received a prescription yesterday, she cannot afford the medication that he has provided for her. Tiotropium Bromide-Olodaterol (STIOLTO RESPIMAT) 2.5-2.5 MCG/ACT AERS, requesting possible alternative that would be more affordable.

## 2024-01-21 NOTE — Addendum Note (Signed)
 Addended by: Yoshiye Kraft on: 01/21/2024 04:40 PM   Modules accepted: Orders

## 2024-01-21 NOTE — Telephone Encounter (Signed)
 Patient came in office today to get script sent to another pharmacy for cheaper this has been done.NFN

## 2024-01-22 ENCOUNTER — Other Ambulatory Visit (HOSPITAL_COMMUNITY): Payer: Self-pay

## 2024-01-22 DIAGNOSIS — M6281 Muscle weakness (generalized): Secondary | ICD-10-CM | POA: Diagnosis not present

## 2024-01-27 ENCOUNTER — Other Ambulatory Visit (HOSPITAL_COMMUNITY): Payer: Self-pay

## 2024-01-27 DIAGNOSIS — M6281 Muscle weakness (generalized): Secondary | ICD-10-CM | POA: Diagnosis not present

## 2024-01-27 NOTE — Telephone Encounter (Signed)
 Copied from CRM [id number]. Topic: Clinical - Prescription Issue >> Jan 27, 2024  9:48 AM Nathanel DEL wrote: Reason for CRM: Ileana w/ [employer] calling to have the dr update page 5 of the pt's application med  Tiotropium Bromide-Olodaterol (STIOLTO RESPIMAT) 2.5-2.5 MCG/ACT AERS.the directions and pcp NPI are missing from her application. Fax:  [phone]  C/b [phone]

## 2024-01-27 NOTE — Telephone Encounter (Signed)
 I returned the call and spoke with Curlee, he stated that the prescription was missing the instructions on how the patient is to take the inhaler.  I was transferred to the pharmacy and I spoke with [facility] (pharmacist), I provided the with the instructions (take 2 puffs twice daily).  I also provided them with Dr. Luann NIP # [phone].  Nothing further needed.

## 2024-01-29 DIAGNOSIS — M6281 Muscle weakness (generalized): Secondary | ICD-10-CM | POA: Diagnosis not present

## 2024-01-31 NOTE — Telephone Encounter (Addendum)
 Copied from CRM [id number]. Topic: Clinical - Prescription Issue >> Jan 27, 2024  9:48 AM Nathanel DEL wrote: Reason for CRM: Ileana w/ [employer] calling to have the dr update page 5 of the pt's application med  Tiotropium Bromide-Olodaterol (STIOLTO RESPIMAT) 2.5-2.5 MCG/ACT AERS.the directions and pcp NPI are missing from her application. Fax:  [phone] C/b [phone] >> Jan 29, 2024 10:53 AM Russell PARAS wrote: Pt is contacting clinic regarding the prescribed Stiolto. She spoke with [facility] today and is being advised they are still missing information to process the prescription. She reports speaking to a rep named [place]. Reviewed chart, which shows encounter by [place], stating missing information was provided to pharmacy on 10/6.   Requesting call back with update on status of missing information being sent to pharmacy  CB# [phone]  [place] and spoke with [place], they stated they did receive verbalization 10/6. Pharmacy stated they are going to process order today. Called and spoke with the pt and let her know this information. Pt is aware and will contact pharmacy for other concerns.

## 2024-01-31 NOTE — Telephone Encounter (Signed)
 Received another call from [employer], they stated the information I provided was incorrect. I called back to verify and they stated the pts income information is wrong and it is not meeting the criteria. Pt needs to contact program to fix information. Called and spoke with the pt and relayed this and provided phone number [phone] and the pt is going to contact them.

## 2024-01-31 NOTE — Telephone Encounter (Signed)
 Copied from CRM [id number]. Topic: Clinical - Prescription Issue >> Jan 31, 2024  9:23 AM [name] wrote: [place] from [employer] patient care assistant line [phone] needs to speak with Dr. Luann CMA Marcus, she gave the wrong information earlier to [facility] and want to provide the correct information. Per CAL, warm transferred to CMA.   This has been handled.

## 2024-02-03 DIAGNOSIS — M6281 Muscle weakness (generalized): Secondary | ICD-10-CM | POA: Diagnosis not present

## 2024-02-05 DIAGNOSIS — M6281 Muscle weakness (generalized): Secondary | ICD-10-CM | POA: Diagnosis not present

## 2024-02-10 DIAGNOSIS — E669 Obesity, unspecified: Secondary | ICD-10-CM | POA: Diagnosis not present

## 2024-02-10 DIAGNOSIS — E0590 Thyrotoxicosis, unspecified without thyrotoxic crisis or storm: Secondary | ICD-10-CM | POA: Diagnosis not present

## 2024-02-10 DIAGNOSIS — Z6831 Body mass index (BMI) 31.0-31.9, adult: Secondary | ICD-10-CM | POA: Diagnosis not present

## 2024-02-10 DIAGNOSIS — I110 Hypertensive heart disease with heart failure: Secondary | ICD-10-CM | POA: Diagnosis not present

## 2024-02-10 DIAGNOSIS — J309 Allergic rhinitis, unspecified: Secondary | ICD-10-CM | POA: Diagnosis not present

## 2024-02-10 DIAGNOSIS — J439 Emphysema, unspecified: Secondary | ICD-10-CM | POA: Diagnosis not present

## 2024-02-10 DIAGNOSIS — I509 Heart failure, unspecified: Secondary | ICD-10-CM | POA: Diagnosis not present

## 2024-02-10 DIAGNOSIS — I700 Atherosclerosis of aorta: Secondary | ICD-10-CM | POA: Diagnosis not present

## 2024-02-10 DIAGNOSIS — K219 Gastro-esophageal reflux disease without esophagitis: Secondary | ICD-10-CM | POA: Diagnosis not present

## 2024-02-10 DIAGNOSIS — Z87440 Personal history of urinary (tract) infections: Secondary | ICD-10-CM | POA: Diagnosis not present

## 2024-02-10 DIAGNOSIS — N952 Postmenopausal atrophic vaginitis: Secondary | ICD-10-CM | POA: Diagnosis not present

## 2024-02-10 DIAGNOSIS — M810 Age-related osteoporosis without current pathological fracture: Secondary | ICD-10-CM | POA: Diagnosis not present

## 2024-02-10 DIAGNOSIS — M5440 Lumbago with sciatica, unspecified side: Secondary | ICD-10-CM | POA: Diagnosis not present

## 2024-02-10 DIAGNOSIS — D6869 Other thrombophilia: Secondary | ICD-10-CM | POA: Diagnosis not present

## 2024-02-10 DIAGNOSIS — I429 Cardiomyopathy, unspecified: Secondary | ICD-10-CM | POA: Diagnosis not present

## 2024-02-10 DIAGNOSIS — Z823 Family history of stroke: Secondary | ICD-10-CM | POA: Diagnosis not present

## 2024-02-10 DIAGNOSIS — M4800 Spinal stenosis, site unspecified: Secondary | ICD-10-CM | POA: Diagnosis not present

## 2024-02-10 DIAGNOSIS — I4891 Unspecified atrial fibrillation: Secondary | ICD-10-CM | POA: Diagnosis not present

## 2024-02-10 DIAGNOSIS — I4892 Unspecified atrial flutter: Secondary | ICD-10-CM | POA: Diagnosis not present

## 2024-02-11 ENCOUNTER — Telehealth: Payer: Self-pay

## 2024-02-11 NOTE — Telephone Encounter (Signed)
 Called to check in with patient, who had LAAO on 02/14/2023. The patient reports doing well with no issues.  Scheduled the patient for 6 month follow-up on 03/02/2024. The patient understands to call with questions or concerns.  She was grateful for call and agreed with plan.

## 2024-02-12 DIAGNOSIS — M6281 Muscle weakness (generalized): Secondary | ICD-10-CM | POA: Diagnosis not present

## 2024-02-19 DIAGNOSIS — M6281 Muscle weakness (generalized): Secondary | ICD-10-CM | POA: Diagnosis not present

## 2024-02-26 DIAGNOSIS — M6281 Muscle weakness (generalized): Secondary | ICD-10-CM | POA: Diagnosis not present

## 2024-02-28 DIAGNOSIS — M810 Age-related osteoporosis without current pathological fracture: Secondary | ICD-10-CM | POA: Diagnosis not present

## 2024-03-02 ENCOUNTER — Ambulatory Visit: Attending: Student | Admitting: Cardiology

## 2024-03-02 ENCOUNTER — Encounter: Payer: Self-pay | Admitting: Student

## 2024-03-02 VITALS — BP 166/60 | HR 50 | Ht 63.0 in | Wt 158.4 lb

## 2024-03-02 DIAGNOSIS — Z95818 Presence of other cardiac implants and grafts: Secondary | ICD-10-CM

## 2024-03-02 DIAGNOSIS — R0609 Other forms of dyspnea: Secondary | ICD-10-CM | POA: Diagnosis not present

## 2024-03-02 DIAGNOSIS — Z9189 Other specified personal risk factors, not elsewhere classified: Secondary | ICD-10-CM | POA: Diagnosis not present

## 2024-03-02 DIAGNOSIS — I480 Paroxysmal atrial fibrillation: Secondary | ICD-10-CM

## 2024-03-02 DIAGNOSIS — Z79899 Other long term (current) drug therapy: Secondary | ICD-10-CM | POA: Diagnosis not present

## 2024-03-02 DIAGNOSIS — R410 Disorientation, unspecified: Secondary | ICD-10-CM | POA: Diagnosis not present

## 2024-03-02 NOTE — Progress Notes (Signed)
 Electrophysiology Office Note:   Date:  03/02/2024  ID:  Bianca, Wolf 1940/05/10, MRN [id number]  Primary Cardiologist: Lonni LITTIE Nanas, MD Electrophysiologist: OLE ONEIDA HOLTS, MD   Electrophysiologist:  OLE ONEIDA HOLTS, MD      History of Present Illness:   Bianca Wolf is a 83 y.o. female with h/o paroxysmal AF, long term amiodarone  use, and HTN  seen today for routine electrophysiology followup.   Since last being seen in our clinic the patient reports doing well overall. She saw pulmonology over the summer and had high res lung CT. No evidence of pulmonary Amiodarone  toxicity. Overall doing well, she denies chest pain, palpitations, PND, orthopnea, nausea, vomiting, dizziness, syncope, edema, weight gain, or early satiety. Continues with chronic/stable dyspnea with exertion beyond slow walking and activities of daily living.  Patient demonstrates confusion today, tells me that she drove to the office twice last night thinking it was Monday morning/time for her appointment. Admits to having more confusion regarding time recently but still drives herself around. Denies missing meals, struggling with ADLs, getting lost.   BP is elevated today, she has not taken morning meds yet. States sometimes I take them in the morning and sometimes at night, whenever I feel like it.  Review of systems complete and found to be negative unless listed in HPI.   EP Information / Studies Reviewed:    EKG is ordered today. Personal review as below.  EKG Interpretation Date/Time:  Monday March 02 2024 08:40:53 EST Ventricular Rate:  50 PR Interval:  182 QRS Duration:  90 QT Interval:  472 QTC Calculation: 430 R Axis:   53  Text Interpretation: Sinus bradycardia When compared with ECG of 30-Aug-2023 11:09, No significant change was found Confirmed by Trudy Birmingham [id number]) on 03/02/2024 8:49:32 AM    Arrhythmia/Device History S/p Watchman 02/14/2023   Physical Exam:   VS:   BP (!) 166/80   Pulse (!) 50   Ht 5' 3 (1.6 m)   Wt 158 lb 6.4 oz (71.8 kg)   SpO2 96%   BMI 28.06 kg/m    Wt Readings from Last 3 Encounters:  03/02/24 158 lb 6.4 oz (71.8 kg)  11/07/23 166 lb 9.6 oz (75.6 kg)  08/30/23 171 lb 9.6 oz (77.8 kg)     GEN: No acute distress NECK: No JVD; No carotid bruits CARDIAC: Regular rate and rhythm, no murmurs, rubs, gallops RESPIRATORY:  Clear to auscultation without rales, wheezing or rhonchi  ABDOMEN: Soft, non-tender, non-distended EXTREMITIES:  No edema; No deformity   ASSESSMENT AND PLAN:    Paroxysmal atrial fibrillation Secondary hypercoagulable state EKG today with stable intervals on Amiodarone , sinus bradycardia. Off OAC s/p LAAO. Patient without symptoms to suggest recurrent afib. Continue Amiodarone  200mg  daily. Update CMET, TSH/T4.  Hypertension BP elevated today, 166/33mmHg. Patient has not yet taken her medications today. Sometimes takes in the morning and other times at night. Encouraged consistent timing and close follow up with PCP.   Confusion Patient demonstrates some confusion today, reports driving to our clinic twice last night thinking it was Monday morning. Also reports inconsistent timing with medications. Given confusion, I had patient call her daughter today and I asked her to ensure patient had close follow up with PCP Dr. Clarice to discuss further.   Dyspnea on exertion Stable symptoms per patient. Has seen pulmonology with high res CT showing no evidence of Amiodarone  toxicity. Continue follow up with pulmonology.      Follow up with EP  Team in 6 months  Signed, Artist Pouch, PA-C

## 2024-03-02 NOTE — Patient Instructions (Signed)
 Medication Instructions:  Your physician recommends that you continue on your current medications as directed. Please refer to the Current Medication list given to you today.  *If you need a refill on your cardiac medications before your next appointment, please call your pharmacy*  Lab Work: TSH, FreeT4, CMET-TODAY If you have labs (blood work) drawn today and your tests are completely normal, you will receive your results only by: MyChart Message (if you have MyChart) OR A paper copy in the mail If you have any lab test that is abnormal or we need to change your treatment, we will call you to review the results.  Follow-Up: At [facility], you and your health needs are our priority.  As part of our continuing mission to provide you with exceptional heart care, our providers are all part of one team.  This team includes your primary Cardiologist (physician) and Advanced Practice Providers or APPs (Physician Assistants and Nurse Practitioners) who all work together to provide you with the care you need, when you need it.  Your next appointment:   6 month(s)  Provider:   You will see one of the following Advanced Practice Providers on your designated Care Team:   Charlies Arthur, [place] Ozell Jodie Passey, PA-C Suzann Riddle, NP Daphne Barrack, NP Artist Pouch, PA-C   We recommend signing up for the patient portal called MyChart.  Sign up information is provided on this After Visit Summary.  MyChart is used to connect with patients for Virtual Visits (Telemedicine).  Patients are able to view lab/test results, encounter notes, upcoming appointments, etc.  Non-urgent messages can be sent to your provider as well.   To learn more about what you can do with MyChart, go to [email or web address].

## 2024-03-03 ENCOUNTER — Ambulatory Visit: Payer: Self-pay | Admitting: Cardiology

## 2024-03-03 LAB — COMPREHENSIVE METABOLIC PANEL WITH GFR
ALT: 8 IU/L (ref 0–32)
AST: 13 IU/L (ref 0–40)
Albumin: 4.1 g/dL (ref 3.7–4.7)
Alkaline Phosphatase: 77 IU/L (ref 48–129)
BUN/Creatinine Ratio: 15 (ref 12–28)
BUN: 18 mg/dL (ref 8–27)
Bilirubin Total: 0.6 mg/dL (ref 0.0–1.2)
CO2: 26 mmol/L (ref 20–29)
Calcium: 9 mg/dL (ref 8.7–10.3)
Chloride: 98 mmol/L (ref 96–106)
Creatinine, Ser: 1.22 mg/dL — ABNORMAL HIGH (ref 0.57–1.00)
Globulin, Total: 2.8 g/dL (ref 1.5–4.5)
Glucose: 81 mg/dL (ref 70–99)
Potassium: 3.9 mmol/L (ref 3.5–5.2)
Sodium: 138 mmol/L (ref 134–144)
Total Protein: 6.9 g/dL (ref 6.0–8.5)
eGFR: 44 mL/min/1.73 — ABNORMAL LOW (ref >59)

## 2024-03-03 LAB — TSH: TSH: 1.92 u[IU]/mL (ref 0.450–4.500)

## 2024-03-03 LAB — T4, FREE: Free T4: 1.38 ng/dL (ref 0.82–1.77)

## 2024-03-03 NOTE — Progress Notes (Signed)
Left voicemail for pt to call back to discuss lab results

## 2024-03-04 DIAGNOSIS — M6281 Muscle weakness (generalized): Secondary | ICD-10-CM | POA: Diagnosis not present

## 2024-03-14 ENCOUNTER — Other Ambulatory Visit (HOSPITAL_COMMUNITY): Payer: Self-pay | Admitting: Internal Medicine

## 2024-03-24 DIAGNOSIS — M810 Age-related osteoporosis without current pathological fracture: Secondary | ICD-10-CM | POA: Diagnosis not present

## 2024-03-28 ENCOUNTER — Other Ambulatory Visit: Payer: Self-pay

## 2024-03-28 ENCOUNTER — Emergency Department (HOSPITAL_COMMUNITY)

## 2024-03-28 ENCOUNTER — Observation Stay (HOSPITAL_COMMUNITY)
Admission: EM | Admit: 2024-03-28 | Discharge: 2024-03-30 | DRG: 190 | Disposition: A | Attending: Internal Medicine | Admitting: Internal Medicine

## 2024-03-28 DIAGNOSIS — J9601 Acute respiratory failure with hypoxia: Secondary | ICD-10-CM

## 2024-03-28 DIAGNOSIS — R0902 Hypoxemia: Secondary | ICD-10-CM

## 2024-03-28 DIAGNOSIS — I4891 Unspecified atrial fibrillation: Secondary | ICD-10-CM | POA: Diagnosis present

## 2024-03-28 DIAGNOSIS — I480 Paroxysmal atrial fibrillation: Secondary | ICD-10-CM | POA: Diagnosis present

## 2024-03-28 DIAGNOSIS — S92311A Displaced fracture of first metatarsal bone, right foot, initial encounter for closed fracture: Secondary | ICD-10-CM

## 2024-03-28 DIAGNOSIS — R079 Chest pain, unspecified: Secondary | ICD-10-CM | POA: Diagnosis not present

## 2024-03-28 DIAGNOSIS — N1831 Chronic kidney disease, stage 3a: Secondary | ICD-10-CM | POA: Insufficient documentation

## 2024-03-28 DIAGNOSIS — S92511A Displaced fracture of proximal phalanx of right lesser toe(s), initial encounter for closed fracture: Secondary | ICD-10-CM | POA: Diagnosis not present

## 2024-03-28 DIAGNOSIS — J449 Chronic obstructive pulmonary disease, unspecified: Secondary | ICD-10-CM

## 2024-03-28 DIAGNOSIS — I10 Essential (primary) hypertension: Secondary | ICD-10-CM | POA: Diagnosis present

## 2024-03-28 DIAGNOSIS — J441 Chronic obstructive pulmonary disease with (acute) exacerbation: Principal | ICD-10-CM | POA: Diagnosis present

## 2024-03-28 DIAGNOSIS — R7989 Other specified abnormal findings of blood chemistry: Secondary | ICD-10-CM | POA: Insufficient documentation

## 2024-03-28 DIAGNOSIS — K219 Gastro-esophageal reflux disease without esophagitis: Secondary | ICD-10-CM | POA: Diagnosis present

## 2024-03-28 DIAGNOSIS — M7731 Calcaneal spur, right foot: Secondary | ICD-10-CM | POA: Diagnosis not present

## 2024-03-28 DIAGNOSIS — S92919A Unspecified fracture of unspecified toe(s), initial encounter for closed fracture: Secondary | ICD-10-CM

## 2024-03-28 DIAGNOSIS — Z041 Encounter for examination and observation following transport accident: Secondary | ICD-10-CM | POA: Diagnosis not present

## 2024-03-28 LAB — CBC
HCT: 33.6 % — ABNORMAL LOW (ref 36.0–46.0)
Hemoglobin: 10.7 g/dL — ABNORMAL LOW (ref 12.0–15.0)
MCH: 30.1 pg (ref 26.0–34.0)
MCHC: 31.8 g/dL (ref 30.0–36.0)
MCV: 94.4 fL (ref 80.0–100.0)
Platelets: 202 K/uL (ref 150–400)
RBC: 3.56 MIL/uL — ABNORMAL LOW (ref 3.87–5.11)
RDW: 13.8 % (ref 11.5–15.5)
WBC: 11.1 K/uL — ABNORMAL HIGH (ref 4.0–10.5)
nRBC: 0 % (ref 0.0–0.2)

## 2024-03-28 LAB — TROPONIN I (HIGH SENSITIVITY)
Troponin I (High Sensitivity): 31 ng/L — ABNORMAL HIGH (ref <18)
Troponin I (High Sensitivity): 33 ng/L — ABNORMAL HIGH (ref <18)

## 2024-03-28 LAB — BASIC METABOLIC PANEL WITH GFR
Anion gap: 7 (ref 5–15)
BUN: 10 mg/dL (ref 8–23)
CO2: 27 mmol/L (ref 22–32)
Calcium: 8.4 mg/dL — ABNORMAL LOW (ref 8.9–10.3)
Chloride: 98 mmol/L (ref 98–111)
Creatinine, Ser: 1.11 mg/dL — ABNORMAL HIGH (ref 0.44–1.00)
GFR, Estimated: 49 mL/min — ABNORMAL LOW (ref >60)
Glucose, Bld: 142 mg/dL — ABNORMAL HIGH (ref 70–99)
Potassium: 3.7 mmol/L (ref 3.5–5.1)
Sodium: 132 mmol/L — ABNORMAL LOW (ref 135–145)

## 2024-03-28 LAB — HEPATIC FUNCTION PANEL
ALT: 18 U/L (ref 0–44)
AST: 27 U/L (ref 15–41)
Albumin: 3.4 g/dL — ABNORMAL LOW (ref 3.5–5.0)
Alkaline Phosphatase: 60 U/L (ref 38–126)
Bilirubin, Direct: 0.3 mg/dL — ABNORMAL HIGH (ref 0.0–0.2)
Indirect Bilirubin: 1.5 mg/dL — ABNORMAL HIGH (ref 0.3–0.9)
Total Bilirubin: 1.8 mg/dL — ABNORMAL HIGH (ref 0.0–1.2)
Total Protein: 7.7 g/dL (ref 6.5–8.1)

## 2024-03-28 LAB — RESP PANEL BY RT-PCR (RSV, FLU A&B, COVID)  RVPGX2
Influenza A by PCR: NEGATIVE
Influenza B by PCR: NEGATIVE
Resp Syncytial Virus by PCR: NEGATIVE
SARS Coronavirus 2 by RT PCR: NEGATIVE

## 2024-03-28 MED ORDER — ACETAMINOPHEN 500 MG PO TABS: 500.0000 mg | ORAL_TABLET | Freq: Four times a day (QID) | ORAL | Status: DC | PRN

## 2024-03-28 MED ORDER — UMECLIDINIUM BROMIDE 62.5 MCG/ACT IN AEPB: 1.0000 | INHALATION_SPRAY | Freq: Every day | RESPIRATORY_TRACT | Status: DC

## 2024-03-28 MED ORDER — SODIUM CHLORIDE 0.9 % IV SOLN: 500.0000 mg | Freq: Once | INTRAVENOUS | Status: DC

## 2024-03-28 MED ORDER — SODIUM CHLORIDE 0.9 % IV SOLN
500.0000 mg | Freq: Once | INTRAVENOUS | Status: AC
  Administered 2024-03-28: 500 mg via INTRAVENOUS
  Filled 2024-03-28: qty 5

## 2024-03-28 MED ORDER — POLYETHYLENE GLYCOL 3350 17 G PO PACK: 17.0000 g | PACK | Freq: Every day | ORAL | Status: DC | PRN

## 2024-03-28 MED ORDER — IPRATROPIUM-ALBUTEROL 0.5-2.5 (3) MG/3ML IN SOLN
3.0000 mL | Freq: Once | RESPIRATORY_TRACT | Status: AC
  Administered 2024-03-28: 3 mL via RESPIRATORY_TRACT
  Filled 2024-03-28: qty 3

## 2024-03-28 MED ORDER — METHYLPREDNISOLONE SODIUM SUCC 40 MG IJ SOLR
40.0000 mg | Freq: Every day | INTRAMUSCULAR | Status: DC
  Administered 2024-03-28 – 2024-03-30 (×3): 40 mg via INTRAVENOUS
  Filled 2024-03-28 (×3): qty 1

## 2024-03-28 MED ORDER — ARFORMOTEROL TARTRATE 15 MCG/2ML IN NEBU
15.0000 ug | INHALATION_SOLUTION | Freq: Two times a day (BID) | RESPIRATORY_TRACT | Status: DC
  Administered 2024-03-29 – 2024-03-30 (×2): 15 ug via RESPIRATORY_TRACT
  Filled 2024-03-28 (×2): qty 2

## 2024-03-28 MED ORDER — SODIUM CHLORIDE 0.9 % IV SOLN
500.0000 mg | INTRAVENOUS | Status: DC
  Administered 2024-03-29: 500 mg via INTRAVENOUS
  Filled 2024-03-28 (×3): qty 5

## 2024-03-28 MED ORDER — PREDNISONE 20 MG PO TABS
60.0000 mg | ORAL_TABLET | Freq: Once | ORAL | Status: AC
  Administered 2024-03-28: 60 mg via ORAL
  Filled 2024-03-28: qty 3

## 2024-03-28 MED ORDER — OXYCODONE HCL 5 MG PO TABS
5.0000 mg | ORAL_TABLET | Freq: Four times a day (QID) | ORAL | Status: DC | PRN
  Administered 2024-03-29 – 2024-03-30 (×3): 5 mg via ORAL
  Filled 2024-03-28 (×3): qty 1

## 2024-03-28 MED ORDER — ENOXAPARIN SODIUM 40 MG/0.4ML IJ SOSY: 40.0000 mg | PREFILLED_SYRINGE | INTRAMUSCULAR | Status: DC

## 2024-03-28 MED ORDER — IOHEXOL 350 MG/ML SOLN
75.0000 mL | Freq: Once | INTRAVENOUS | Status: AC | PRN
  Administered 2024-03-28: 75 mL via INTRAVENOUS

## 2024-03-28 MED ORDER — MELATONIN 5 MG PO TABS: 5.0000 mg | ORAL_TABLET | Freq: Every evening | ORAL | Status: DC | PRN

## 2024-03-28 MED ORDER — SODIUM CHLORIDE 0.9 % IV SOLN: 500.0000 mg | INTRAVENOUS | Status: DC

## 2024-03-28 MED ORDER — HYDRALAZINE HCL 10 MG PO TABS
10.0000 mg | ORAL_TABLET | Freq: Three times a day (TID) | ORAL | Status: DC
  Administered 2024-03-28 – 2024-03-30 (×5): 10 mg via ORAL
  Filled 2024-03-28 (×7): qty 1

## 2024-03-28 MED ORDER — HYDRALAZINE HCL 20 MG/ML IJ SOLN
5.0000 mg | INTRAMUSCULAR | Status: AC
  Administered 2024-03-28: 5 mg via INTRAVENOUS
  Filled 2024-03-28: qty 1

## 2024-03-28 MED ORDER — IPRATROPIUM-ALBUTEROL 0.5-2.5 (3) MG/3ML IN SOLN
3.0000 mL | RESPIRATORY_TRACT | Status: DC
  Administered 2024-03-28 – 2024-03-30 (×9): 3 mL via RESPIRATORY_TRACT
  Filled 2024-03-28 (×9): qty 3

## 2024-03-28 MED ORDER — PROCHLORPERAZINE EDISYLATE 10 MG/2ML IJ SOLN: 5.0000 mg | Freq: Four times a day (QID) | INTRAMUSCULAR | Status: DC | PRN

## 2024-03-28 NOTE — ED Notes (Addendum)
 When ambulating to restroom patient's oxygen  saturation dropped to 93%. When ambulating back to room, patient's oxygen  saturation dropped to 87%. Patient had a shuffling gait throughout. When placed back in the bed, patient's work of breathing was notably increased and patient was short of breath.

## 2024-03-28 NOTE — Progress Notes (Signed)
 Orthopedic Tech Progress Note Patient Details:  Bianca Wolf July 21, 1940 [id number]  Ortho Devices Type of Ortho Device: Postop shoe/boot Ortho Device/Splint Interventions: Ordered, Application, Adjustment   Post Interventions Patient Tolerated: Well Instructions Provided: Care of device, Adjustment of device  Chandra Dorn PARAS 03/28/2024, 11:11 PM
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
## 2024-03-28 NOTE — ED Notes (Signed)
 Ambulated patient with pulse ox, O2 bumped between 85-88 while walking, at rest stayed around 88% in bed. Placed on 2L [place] and now at 94%
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
## 2024-03-29 DIAGNOSIS — N1831 Chronic kidney disease, stage 3a: Secondary | ICD-10-CM | POA: Insufficient documentation

## 2024-03-29 DIAGNOSIS — R7989 Other specified abnormal findings of blood chemistry: Secondary | ICD-10-CM | POA: Insufficient documentation

## 2024-03-29 DIAGNOSIS — J9601 Acute respiratory failure with hypoxia: Secondary | ICD-10-CM

## 2024-03-29 DIAGNOSIS — S92919A Unspecified fracture of unspecified toe(s), initial encounter for closed fracture: Secondary | ICD-10-CM

## 2024-03-29 DIAGNOSIS — J449 Chronic obstructive pulmonary disease, unspecified: Secondary | ICD-10-CM

## 2024-03-29 DIAGNOSIS — I4891 Unspecified atrial fibrillation: Secondary | ICD-10-CM | POA: Diagnosis not present

## 2024-03-29 DIAGNOSIS — J441 Chronic obstructive pulmonary disease with (acute) exacerbation: Secondary | ICD-10-CM | POA: Diagnosis not present

## 2024-03-29 LAB — RESPIRATORY PANEL BY PCR

## 2024-03-29 LAB — CBC
HCT: 34.6 % — ABNORMAL LOW (ref 36.0–46.0)
Hemoglobin: 11.1 g/dL — ABNORMAL LOW (ref 12.0–15.0)
MCH: 30.2 pg (ref 26.0–34.0)
MCHC: 32.1 g/dL (ref 30.0–36.0)
MCV: 94.3 fL (ref 80.0–100.0)
Platelets: 204 K/uL (ref 150–400)
RBC: 3.67 MIL/uL — ABNORMAL LOW (ref 3.87–5.11)
RDW: 14.1 % (ref 11.5–15.5)
WBC: 9.8 K/uL (ref 4.0–10.5)
nRBC: 0 % (ref 0.0–0.2)

## 2024-03-29 LAB — BASIC METABOLIC PANEL WITH GFR
Anion gap: 15 (ref 5–15)
BUN: 9 mg/dL (ref 8–23)
CO2: 27 mmol/L (ref 22–32)
Calcium: 9.3 mg/dL (ref 8.9–10.3)
Chloride: 98 mmol/L (ref 98–111)
Creatinine, Ser: 1.07 mg/dL — ABNORMAL HIGH (ref 0.44–1.00)
GFR, Estimated: 51 mL/min — ABNORMAL LOW (ref >60)
Glucose, Bld: 145 mg/dL — ABNORMAL HIGH (ref 70–99)
Potassium: 3.8 mmol/L (ref 3.5–5.1)
Sodium: 138 mmol/L (ref 135–145)

## 2024-03-29 LAB — PHOSPHORUS: Phosphorus: 3.4 mg/dL (ref 2.5–4.6)

## 2024-03-29 LAB — MAGNESIUM: Magnesium: 1.8 mg/dL (ref 1.7–2.4)

## 2024-03-29 MED ORDER — BUDESONIDE 0.25 MG/2ML IN SUSP
0.2500 mg | Freq: Two times a day (BID) | RESPIRATORY_TRACT | Status: DC
  Administered 2024-03-29 – 2024-03-30 (×2): 0.25 mg via RESPIRATORY_TRACT
  Filled 2024-03-29 (×2): qty 2

## 2024-03-29 MED ORDER — IPRATROPIUM-ALBUTEROL 0.5-2.5 (3) MG/3ML IN SOLN: 3.0000 mL | RESPIRATORY_TRACT | Status: DC | PRN

## 2024-03-29 MED ORDER — LABETALOL HCL 5 MG/ML IV SOLN: 10.0000 mg | INTRAVENOUS | Status: DC | PRN

## 2024-03-29 MED ORDER — HYDRALAZINE HCL 20 MG/ML IJ SOLN: 10.0000 mg | INTRAMUSCULAR | Status: DC | PRN

## 2024-03-29 MED ORDER — AMIODARONE HCL 200 MG PO TABS
200.0000 mg | ORAL_TABLET | Freq: Every day | ORAL | Status: DC
  Administered 2024-03-29 – 2024-03-30 (×2): 200 mg via ORAL
  Filled 2024-03-29 (×2): qty 1

## 2024-03-29 MED ORDER — IRBESARTAN 150 MG PO TABS
150.0000 mg | ORAL_TABLET | Freq: Every day | ORAL | Status: DC
  Administered 2024-03-29 – 2024-03-30 (×2): 150 mg via ORAL
  Filled 2024-03-29 (×2): qty 1
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
## 2024-03-29 NOTE — Progress Notes (Signed)
 Physical Therapy Treatment Note  (Full note to follow)  SATURATION QUALIFICATIONS: (This note is used to comply with regulatory documentation for home oxygen )  Patient Saturations on Room Air at Rest = 99%  Patient Saturations on Room Air while Ambulating = 85%  Patient Saturations on 2 Liters of oxygen  while Ambulating = 94%  Please briefly explain why patient needs home oxygen : Patient requires supplemental oxygen  to maintain oxygen  saturations at acceptable, safe levels with physical activity.   Silvano Currier, PT  Acute Rehabilitation Services Office [phone] Secure Chat welcomed
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
## 2024-03-29 NOTE — ED Notes (Signed)
 Pt was given incentive spirometer and Nurse educated on how to use it. Pt demonstrated how to use spirometer.

## 2024-03-29 NOTE — Progress Notes (Signed)
 Physical Therapy Note  PT eval complete with full note to follow;  Recommend Home with family assist for transition out of hospital;  We discussed HHPT, and pt is declining;  Rec RW for amb at this time to help with pain by lessening pressure through R foot in stance;  REc supplemental O2 at this time as well;   Silvano Currier, PT  Acute Rehabilitation Services Pager [phone] Office [phone]

## 2024-03-29 NOTE — Assessment & Plan Note (Signed)
-   No chronic home meds noted

## 2024-03-29 NOTE — Assessment & Plan Note (Signed)
-   patient has history of CKD3a. Baseline creat ~ 0.9 - 1.2, eGFR~ 57 - at baseline
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
## 2024-03-29 NOTE — ED Notes (Signed)
 Pt able to maintain O2 saturations at 100% on RA when resting

## 2024-03-29 NOTE — Assessment & Plan Note (Signed)
-   s/p Watchman LAA on 02/14/23; no longer on Eliquis  now - Follows closely with EP; recently seen on 03/02/2024 and doing well and stable - Remains on amiodarone  -Recent CT chest high-resolution 12/02/2023, no evidence of amiodarone  toxicity
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
## 2024-03-29 NOTE — Assessment & Plan Note (Signed)
-   SpO2 <= 88% on RA with clinical signs of respiratory distress including dyspnea, tachypnea, wheezing - Not on oxygen  at baseline - Continue O2 and wean off as able

## 2024-03-29 NOTE — ED Notes (Signed)
 Therapist with pt at this time

## 2024-03-29 NOTE — Assessment & Plan Note (Addendum)
-   Presents with worsening shortness of breath/dyspnea and worsening cough with production of some green sputum recently she says; not getting as much relief from her home inhalers therefore presented for further evaluation - No significant infiltrates noted on CXR - COVID, flu, RSV negative -Check RVP - Continue scheduled and as needed DuoNebs - Continue Pulmicort  and budesonide  - Continue Solu-Medrol  - Continue azithromycin  -Continue incentive spirometry

## 2024-03-29 NOTE — Assessment & Plan Note (Signed)
-   Recently changed from ACE inhibitor to ARB -On Benicar.  Now on Avapro  for hospital substitution
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
## 2024-03-29 NOTE — Assessment & Plan Note (Signed)
-   Follows with pulmonology, Dr. Kara -Recent high-resolution CT chest 12/02/2023 showing emphysema and no evidence of amiodarone  toxicity -Former smoker, quit around 1979 - Compliant on stiolto inhaler at home

## 2024-03-30 ENCOUNTER — Other Ambulatory Visit (HOSPITAL_COMMUNITY): Payer: Self-pay

## 2024-03-30 DIAGNOSIS — J9601 Acute respiratory failure with hypoxia: Secondary | ICD-10-CM

## 2024-03-30 MED ORDER — OXYCODONE HCL 5 MG PO TABS
5.0000 mg | ORAL_TABLET | Freq: Four times a day (QID) | ORAL | 0 refills | Status: DC | PRN
  Filled 2024-03-30: qty 20, 3d supply, fill #0

## 2024-03-30 MED ORDER — PREDNISONE 20 MG PO TABS
40.0000 mg | ORAL_TABLET | Freq: Every day | ORAL | 0 refills | Status: AC
  Filled 2024-03-30: qty 4, 2d supply, fill #0

## 2024-03-30 MED ORDER — AZITHROMYCIN 500 MG PO TABS
500.0000 mg | ORAL_TABLET | Freq: Every day | ORAL | 0 refills | Status: AC
  Filled 2024-03-30: qty 3, 3d supply, fill #0

## 2024-03-30 NOTE — Plan of Care (Signed)
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
## 2024-03-30 NOTE — Progress Notes (Addendum)
 SATURATION QUALIFICATIONS: (This note is used to comply with regulatory documentation for home oxygen )  Patient Saturations on Room Air at Rest = 84%  Patient Saturations on Room Air while Ambulating = 83%  Patient Saturations on 2 Liters of oxygen  while Ambulating = 88%  Please briefly explain why patient needs home oxygen : To maintain oxygen  saturations >/= 88% during rest and with activity  Aleck Daring, PT, DPT Acute Rehabilitation Services Office [phone]

## 2024-03-30 NOTE — Progress Notes (Signed)
    Durable Medical Equipment  (From admission, onward)           Start     Ordered   03/30/24 1119  For home use only DME 4 wheeled rolling walker with seat  Once       Question:  Patient needs a walker to treat with the following condition  Answer:  Generalized muscle weakness   03/30/24 1118   03/30/24 1119  For home use only DME Shower stool  Once        03/30/24 1118   03/30/24 1119  For home use only DME oxygen   Once       Question Answer Comment  Length of Need 6 Months   Mode or (Route) Nasal cannula   Liters per Minute 2   Frequency Continuous (stationary and portable oxygen  unit needed)   Oxygen  conserving device Yes   Oxygen  delivery system: Gas   Oxygen  delivery system: Portable concentrator (POC)      03/30/24 1118
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
## 2024-03-30 NOTE — Plan of Care (Signed)
 IV removed, patient and patient's daughter educated with no further questions, TOC meds given to patient. Will continue to monitor until discharged.   Problem: Education: Goal: Knowledge of General Education information will improve Description: Including pain rating scale, medication(s)/side effects and non-pharmacologic comfort measures 03/30/2024 1251 by Francia Tinnie BROCKS, RN Outcome: Adequate for Discharge 03/30/2024 0815 by Francia Tinnie BROCKS, RN Outcome: Progressing   Problem: Health Behavior/Discharge Planning: Goal: Ability to manage health-related needs will improve Outcome: Adequate for Discharge   Problem: Clinical Measurements: Goal: Ability to maintain clinical measurements within normal limits will improve Outcome: Adequate for Discharge Goal: Will remain free from infection Outcome: Adequate for Discharge Goal: Diagnostic test results will improve Outcome: Adequate for Discharge Goal: Respiratory complications will improve Outcome: Adequate for Discharge Goal: Cardiovascular complication will be avoided Outcome: Adequate for Discharge   Problem: Activity: Goal: Risk for activity intolerance will decrease 03/30/2024 1251 by Francia Tinnie BROCKS, RN Outcome: Adequate for Discharge 03/30/2024 0815 by Francia Tinnie BROCKS, RN Outcome: Progressing   Problem: Nutrition: Goal: Adequate nutrition will be maintained Outcome: Adequate for Discharge   Problem: Coping: Goal: Level of anxiety will decrease Outcome: Adequate for Discharge   Problem: Elimination: Goal: Will not experience complications related to bowel motility Outcome: Adequate for Discharge Goal: Will not experience complications related to urinary retention Outcome: Adequate for Discharge   Problem: Pain Managment: Goal: General experience of comfort will improve and/or be controlled 03/30/2024 1251 by Francia Tinnie BROCKS, RN Outcome: Adequate for Discharge 03/30/2024 0815 by Francia Tinnie BROCKS, RN Outcome:  Progressing   Problem: Safety: Goal: Ability to remain free from injury will improve 03/30/2024 1251 by Francia Tinnie BROCKS, RN Outcome: Adequate for Discharge 03/30/2024 0815 by Francia Tinnie BROCKS, RN Outcome: Progressing   Problem: Skin Integrity: Goal: Risk for impaired skin integrity will decrease 03/30/2024 1251 by Francia Tinnie BROCKS, RN Outcome: Adequate for Discharge 03/30/2024 0815 by Francia Tinnie BROCKS, RN Outcome: Progressing   Problem: Acute Rehab PT Goals(only PT should resolve) Goal: Pt Will Go Supine/Side To Sit Outcome: Adequate for Discharge Goal: Patient Will Transfer Sit To/From Stand Outcome: Adequate for Discharge Goal: Pt Will Ambulate Outcome: Adequate for Discharge Goal: Pt Will Go Up/Down Stairs Outcome: Adequate for Discharge   Problem: Acute Rehab OT Goals (only OT should resolve) Goal: Pt. Will Perform Grooming Outcome: Adequate for Discharge Goal: Pt. Will Perform Lower Body Dressing Outcome: Adequate for Discharge Goal: Pt. Will Transfer To Toilet Outcome: Adequate for Discharge
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
## 2024-03-30 NOTE — TOC Transition Note (Signed)
 Transition of Care [facility]) - Discharge Note   Patient Details  Name: Bianca Wolf MRN: [id number] Date of Birth: 10-29-1940  Transition [facility]) CM/SW Contact:  Rosalva Jon Bloch, RN Phone Number: 03/30/2024, 12:16 PM   Clinical Narrative:    Patient will DC to: home Anticipated DC date: 03/30/2024 Family notified: yes Transport by: car  Acute exacerbation of chronic obstructive pulmonary disease  Per MD patient ready for DC today. RN, patient, patient's  and  daughter Elyn notified of DC. Pt agreeable to home health services. Pt without provider preference. Referral made with [facility] and accepted. Referral made with [employer] for DME ( rollator, shower stool , oxygen ). Equipment will be delivered to beside prior to d/c. Pt without RX med concerns.  Daughter to provide transportation to home. Post hospital f/u noted on AVS.  RNCM will sign off for now as intervention is no longer needed. Please consult us  again if new needs arise.   Final next level of care: Home w Home Health Services Barriers to Discharge: No Barriers Identified   Patient Goals and CMS Choice     Choice offered to / list presented to : Patient, Adult Children      Discharge Placement                       Discharge Plan and Services Additional resources added to the After Visit Summary for                  DME Arranged: Oxygen , Walker rolling with seat, Shower stool DME Agency: [facility] Date DME Agency Contacted: 03/30/24 Time DME Agency Contacted: 1145 Representative spoke with at DME Agency: [facility] HH Arranged: PT, OT HH Agency: [facility] Date HH Agency Contacted: 03/30/24 Time HH Agency Contacted: 1146 Representative spoke with at [facility] Agency: [facility]  Social Drivers of Health (SDOH) Interventions SDOH Screenings   Food Insecurity: No Food Insecurity (03/29/2024)  Housing: Low Risk  (03/29/2024)  Transportation Needs: No Transportation Needs  (03/29/2024)  Utilities: Not At Risk (03/29/2024)  Social Connections: Moderately Isolated (03/29/2024)  Tobacco Use: Medium Risk (03/02/2024)     Readmission Risk Interventions     No data to display

## 2024-03-31 ENCOUNTER — Telehealth: Payer: Self-pay | Admitting: *Deleted

## 2024-03-31 DIAGNOSIS — J4521 Mild intermittent asthma with (acute) exacerbation: Secondary | ICD-10-CM | POA: Diagnosis not present

## 2024-03-31 NOTE — Transitions of Care (Post Inpatient/ED Visit) (Signed)
 03/31/2024  Name: Bianca Wolf MRN: [id number] DOB: April 22, 1941  Today's TOC FU Call Status: Today's TOC FU Call Status:: Successful TOC FU Call Completed TOC FU Call Complete Date: 03/31/24  Patient's Name and Date of Birth confirmed.    Transition Care Management Follow-up Telephone Call Date of Discharge: 03/30/24 Discharge Facility: [facility]) Type of Discharge: Inpatient Admission Primary Inpatient Discharge Diagnosis:: Acute exacerbation of chronic obstructive pulmonary disease How have you been since you were released from the hospital?: Better Any questions or concerns?: No  Items Reviewed: Did you receive and understand the discharge instructions provided?: No Medications obtained,verified, and reconciled?: Yes (Medications Reviewed) Any new allergies since your discharge?: No Dietary orders reviewed?: No Do you have support at home?: Yes People in Home [RPT]: alone Name of Support/Comfort Primary Source: daughter eva  Medications Reviewed Today: Medications Reviewed Today     Reviewed by Kennieth Cathlean DEL, RN (Case Manager) on 03/31/24 at [phone]  Med List Status: <None>   Medication Order Taking? Sig Documenting Provider Last Dose Status Informant  albuterol  (VENTOLIN  HFA) 108 (90 Base) MCG/ACT inhaler [id number] Yes Inhale 2 puffs into the lungs every 6 (six) hours as needed for wheezing or shortness of breath. Kara Dorn NOVAK, MD  Active Child, Pharmacy Records  amiodarone  (PACERONE ) 200 MG tablet [id number] Yes Take 1 tablet (200 mg total) by mouth daily. Terra Fairy PARAS, PA-C  Active Child, Pharmacy Records  azithromycin  (ZITHROMAX ) 500 MG tablet [id number] Yes Take 1 tablet (500 mg total) by mouth daily for 3 days. Patsy Lenis, MD  Active   cyanocobalamin  2000 MCG tablet [id number] Yes Take 2,000 mcg by mouth daily. [provider]  Active Child, Pharmacy Records  furosemide  (LASIX ) 20 MG tablet [id number] Yes TAKE 1 TABLET BY MOUTH ONCE DAILY AS  NEEDED FOR  WEIGHT  INCREASE  OF  3LBS  OVERNIGHT  OR  5LBS  IN  1  WEEK Kate Lonni CROME, MD  Active Child, Pharmacy Records  ibuprofen (ADVIL) 200 MG tablet [id number] Yes Take 400 mg by mouth daily as needed for mild pain (pain score 1-3). [provider]  Active Child, Pharmacy Records  metoprolol  succinate (TOPROL -XL) 25 MG 24 hr tablet [id number] Yes Take 1 tablet (25 mg total) by mouth daily. Take with or immediately following a meal Trudy Birmingham, PA-C  Active Child, Pharmacy Records  olmesartan (BENICAR) 20 MG tablet [id number] Yes Take 20 mg by mouth daily. [provider]  Active Child, Pharmacy Records  oxyCODONE  (OXY IR/ROXICODONE ) 5 MG immediate release tablet [id number] Yes Take 1-2 tablets (5-10 mg total) by mouth every 6 (six) hours as needed for moderate pain (pain score 4-6), breakthrough pain or severe pain (pain score 7-10). Patsy Lenis, MD  Active   predniSONE  (DELTASONE ) 20 MG tablet [id number] Yes Take 2 tablets (40 mg total) by mouth daily with breakfast for 2 days. Patsy Lenis, MD  Active   Tiotropium Bromide-Olodaterol (STIOLTO RESPIMAT ) 2.5-2.5 MCG/ACT AERS [id number] Yes Inhale 2 puffs into the lungs daily. Kara Dorn NOVAK, MD  Active Child, Pharmacy Records            Home Care and Equipment/Supplies: Were Home Health Services Ordered?: Yes [facility] of [facility] Agency[facility] Has Agency set up a time to come to your home?: No EMR reviewed for Home Health Orders: Orders present/patient has not received call (refer to CM for follow-up) (patient is awaiting call from agency) Any new equipment or medical supplies ordered?: Yes Name of Medical  supply agency?: [facility] Were you able to get the equipment/medical supplies?: Yes Do you have any questions related to the use of the equipment/supplies?: No  Functional Questionnaire: Do you need assistance with bathing/showering or dressing?: No Do you need assistance with meal preparation?:  Yes Do you need assistance with eating?: No Do you have difficulty maintaining continence: No Do you need assistance with getting out of bed/getting out of a chair/moving?: No Do you have difficulty managing or taking your medications?: No  Follow up appointments reviewed: PCP Follow-up appointment confirmed?: No MD Provider Line Number:[phone] Given: No (Patient will call for appt) Specialist Hospital Follow-up appointment confirmed?: No Reason Specialist Follow-Up Not Confirmed: Patient has Specialist Provider Number and will Call for Appointment (referral made to ortho for follow up) Do you need transportation to your follow-up appointment?: No Do you understand care options if your condition(s) worsen?: Yes-patient verbalized understanding  SDOH Interventions Today    Flowsheet Row Most Recent Value  SDOH Interventions   Food Insecurity Interventions Intervention Not Indicated  Housing Interventions Intervention Not Indicated  Transportation Interventions Intervention Not Indicated, Patient Resources (Friends/Family)  Utilities Interventions Intervention Not Indicated   Discussed and offered 30 day TOC program.  Patient  declined.  The patient has been provided with contact information for the care management team and has been advised to call with any health -related questions or concerns.  The patient verbalized understanding with current plan of care.  The patient is directed to their insurance card regarding availability of benefits coverage  Cathlean Headland BSN RN [facility] Care Management Coordinator [email or web address] Direct Dial: [phone]  Fax: [phone] Website: [email or web address]

## 2024-05-04 ENCOUNTER — Other Ambulatory Visit: Payer: Self-pay

## 2024-05-04 ENCOUNTER — Inpatient Hospital Stay (HOSPITAL_COMMUNITY)
Admission: EM | Admit: 2024-05-04 | Discharge: 2024-05-06 | DRG: 291 | Disposition: A | Discharge status: Home / Self Care | Attending: Internal Medicine | Admitting: Internal Medicine

## 2024-05-04 ENCOUNTER — Emergency Department (HOSPITAL_COMMUNITY)

## 2024-05-04 ENCOUNTER — Ambulatory Visit: Payer: Self-pay | Admitting: Pulmonary Disease

## 2024-05-04 ENCOUNTER — Encounter (HOSPITAL_COMMUNITY): Payer: Self-pay | Admitting: *Deleted

## 2024-05-04 DIAGNOSIS — Z6828 Body mass index (BMI) 28.0-28.9, adult: Secondary | ICD-10-CM | POA: Diagnosis not present

## 2024-05-04 DIAGNOSIS — Z8249 Family history of ischemic heart disease and other diseases of the circulatory system: Secondary | ICD-10-CM

## 2024-05-04 DIAGNOSIS — I10 Essential (primary) hypertension: Secondary | ICD-10-CM

## 2024-05-04 DIAGNOSIS — J449 Chronic obstructive pulmonary disease, unspecified: Secondary | ICD-10-CM | POA: Diagnosis present

## 2024-05-04 DIAGNOSIS — F32A Depression, unspecified: Secondary | ICD-10-CM | POA: Diagnosis present

## 2024-05-04 DIAGNOSIS — I5043 Acute on chronic combined systolic (congestive) and diastolic (congestive) heart failure: Secondary | ICD-10-CM | POA: Diagnosis present

## 2024-05-04 DIAGNOSIS — J9601 Acute respiratory failure with hypoxia: Secondary | ICD-10-CM | POA: Diagnosis present

## 2024-05-04 DIAGNOSIS — Z95818 Presence of other cardiac implants and grafts: Secondary | ICD-10-CM | POA: Diagnosis not present

## 2024-05-04 DIAGNOSIS — J9621 Acute and chronic respiratory failure with hypoxia: Secondary | ICD-10-CM | POA: Diagnosis present

## 2024-05-04 DIAGNOSIS — Z87891 Personal history of nicotine dependence: Secondary | ICD-10-CM | POA: Diagnosis not present

## 2024-05-04 DIAGNOSIS — I110 Hypertensive heart disease with heart failure: Secondary | ICD-10-CM | POA: Diagnosis present

## 2024-05-04 DIAGNOSIS — R011 Cardiac murmur, unspecified: Secondary | ICD-10-CM | POA: Diagnosis present

## 2024-05-04 DIAGNOSIS — I2510 Atherosclerotic heart disease of native coronary artery without angina pectoris: Secondary | ICD-10-CM | POA: Diagnosis present

## 2024-05-04 DIAGNOSIS — Z833 Family history of diabetes mellitus: Secondary | ICD-10-CM | POA: Diagnosis not present

## 2024-05-04 DIAGNOSIS — I160 Hypertensive urgency: Secondary | ICD-10-CM | POA: Diagnosis present

## 2024-05-04 DIAGNOSIS — I480 Paroxysmal atrial fibrillation: Secondary | ICD-10-CM | POA: Diagnosis present

## 2024-05-04 DIAGNOSIS — K219 Gastro-esophageal reflux disease without esophagitis: Secondary | ICD-10-CM | POA: Diagnosis present

## 2024-05-04 DIAGNOSIS — J432 Centrilobular emphysema: Secondary | ICD-10-CM

## 2024-05-04 DIAGNOSIS — Z1152 Encounter for screening for COVID-19: Secondary | ICD-10-CM | POA: Diagnosis not present

## 2024-05-04 DIAGNOSIS — R001 Bradycardia, unspecified: Secondary | ICD-10-CM | POA: Diagnosis present

## 2024-05-04 DIAGNOSIS — R0602 Shortness of breath: Secondary | ICD-10-CM | POA: Diagnosis present

## 2024-05-04 DIAGNOSIS — R0609 Other forms of dyspnea: Secondary | ICD-10-CM | POA: Diagnosis not present

## 2024-05-04 DIAGNOSIS — Z66 Do not resuscitate: Secondary | ICD-10-CM | POA: Diagnosis present

## 2024-05-04 DIAGNOSIS — Z9981 Dependence on supplemental oxygen: Secondary | ICD-10-CM

## 2024-05-04 DIAGNOSIS — Z79899 Other long term (current) drug therapy: Secondary | ICD-10-CM

## 2024-05-04 DIAGNOSIS — I509 Heart failure, unspecified: Principal | ICD-10-CM

## 2024-05-04 DIAGNOSIS — E876 Hypokalemia: Secondary | ICD-10-CM | POA: Diagnosis present

## 2024-05-04 DIAGNOSIS — E663 Overweight: Secondary | ICD-10-CM | POA: Diagnosis present

## 2024-05-04 DIAGNOSIS — D509 Iron deficiency anemia, unspecified: Secondary | ICD-10-CM | POA: Diagnosis present

## 2024-05-04 HISTORY — DX: Chronic obstructive pulmonary disease, unspecified: J44.9

## 2024-05-04 LAB — COMPREHENSIVE METABOLIC PANEL WITH GFR
ALT: 9 U/L (ref 0–44)
AST: 16 U/L (ref 15–41)
Albumin: 3.8 g/dL (ref 3.5–5.0)
Alkaline Phosphatase: 86 U/L (ref 38–126)
Anion gap: 11 (ref 5–15)
BUN: 8 mg/dL (ref 8–23)
CO2: 28 mmol/L (ref 22–32)
Calcium: 8.8 mg/dL — ABNORMAL LOW (ref 8.9–10.3)
Chloride: 103 mmol/L (ref 98–111)
Creatinine, Ser: 0.92 mg/dL (ref 0.44–1.00)
GFR, Estimated: >60 mL/min
Glucose, Bld: 90 mg/dL (ref 70–99)
Potassium: 3.3 mmol/L — ABNORMAL LOW (ref 3.5–5.1)
Sodium: 142 mmol/L (ref 135–145)
Total Bilirubin: 0.9 mg/dL (ref 0.0–1.2)
Total Protein: 7.1 g/dL (ref 6.5–8.1)

## 2024-05-04 LAB — CBC WITH DIFFERENTIAL/PLATELET
Abs Immature Granulocytes: 0.02 K/uL (ref 0.00–0.07)
Basophils Absolute: 0 K/uL (ref 0.0–0.1)
Basophils Relative: 0 %
Eosinophils Absolute: 0.1 K/uL (ref 0.0–0.5)
Eosinophils Relative: 1 %
HCT: 33 % — ABNORMAL LOW (ref 36.0–46.0)
Hemoglobin: 10.1 g/dL — ABNORMAL LOW (ref 12.0–15.0)
Immature Granulocytes: 0 %
Lymphocytes Relative: 11 %
Lymphs Abs: 0.7 K/uL (ref 0.7–4.0)
MCH: 29.7 pg (ref 26.0–34.0)
MCHC: 30.6 g/dL (ref 30.0–36.0)
MCV: 97.1 fL (ref 80.0–100.0)
Monocytes Absolute: 0.4 K/uL (ref 0.1–1.0)
Monocytes Relative: 7 %
Neutro Abs: 5.2 K/uL (ref 1.7–7.7)
Neutrophils Relative %: 81 %
Platelets: 222 K/uL (ref 150–400)
RBC: 3.4 MIL/uL — ABNORMAL LOW (ref 3.87–5.11)
RDW: 14.8 % (ref 11.5–15.5)
WBC: 6.5 K/uL (ref 4.0–10.5)
nRBC: 0 % (ref 0.0–0.2)

## 2024-05-04 LAB — RESP PANEL BY RT-PCR (RSV, FLU A&B, COVID)  RVPGX2
Influenza A by PCR: NEGATIVE
Influenza B by PCR: NEGATIVE
Resp Syncytial Virus by PCR: NEGATIVE
SARS Coronavirus 2 by RT PCR: NEGATIVE

## 2024-05-04 LAB — TROPONIN T, HIGH SENSITIVITY
Troponin T High Sensitivity: 22 ng/L — ABNORMAL HIGH (ref 0–19)
Troponin T High Sensitivity: 23 ng/L — ABNORMAL HIGH (ref 0–19)

## 2024-05-04 LAB — PRO BRAIN NATRIURETIC PEPTIDE: Pro Brain Natriuretic Peptide: 3051 pg/mL — ABNORMAL HIGH

## 2024-05-04 MED ORDER — ONDANSETRON HCL 4 MG PO TABS: 4.0000 mg | ORAL_TABLET | Freq: Four times a day (QID) | ORAL | Status: DC | PRN

## 2024-05-04 MED ORDER — FUROSEMIDE 10 MG/ML IJ SOLN
40.0000 mg | Freq: Once | INTRAMUSCULAR | Status: AC
  Administered 2024-05-04: 40 mg via INTRAVENOUS
  Filled 2024-05-04: qty 4

## 2024-05-04 MED ORDER — ACETAMINOPHEN 325 MG PO TABS: 650.0000 mg | ORAL_TABLET | Freq: Four times a day (QID) | ORAL | Status: DC | PRN

## 2024-05-04 MED ORDER — ENOXAPARIN SODIUM 40 MG/0.4ML IJ SOSY
40.0000 mg | PREFILLED_SYRINGE | INTRAMUSCULAR | Status: DC
  Administered 2024-05-04 – 2024-05-05 (×2): 40 mg via SUBCUTANEOUS
  Filled 2024-05-04 (×2): qty 0.4

## 2024-05-04 MED ORDER — HYDRALAZINE HCL 20 MG/ML IJ SOLN
10.0000 mg | Freq: Once | INTRAMUSCULAR | Status: AC
  Administered 2024-05-04: 10 mg via INTRAVENOUS
  Filled 2024-05-04: qty 1

## 2024-05-04 MED ORDER — POTASSIUM CHLORIDE CRYS ER 20 MEQ PO TBCR
40.0000 meq | EXTENDED_RELEASE_TABLET | Freq: Once | ORAL | Status: AC
  Administered 2024-05-04: 40 meq via ORAL
  Filled 2024-05-04: qty 2

## 2024-05-04 MED ORDER — ONDANSETRON HCL 4 MG/2ML IJ SOLN: 4.0000 mg | Freq: Four times a day (QID) | INTRAMUSCULAR | Status: DC | PRN

## 2024-05-04 MED ORDER — SENNOSIDES-DOCUSATE SODIUM 8.6-50 MG PO TABS: 1.0000 | ORAL_TABLET | Freq: Every evening | ORAL | Status: DC | PRN

## 2024-05-04 MED ORDER — BISACODYL 5 MG PO TBEC: 5.0000 mg | DELAYED_RELEASE_TABLET | Freq: Every day | ORAL | Status: DC | PRN

## 2024-05-04 MED ORDER — ACETAMINOPHEN 650 MG RE SUPP: 650.0000 mg | Freq: Four times a day (QID) | RECTAL | Status: DC | PRN

## 2024-05-04 NOTE — ED Notes (Signed)
 Patient wanted to ambulate to the bathroom with out o2. I assisted patient to the restroom right across the hall from her room. Patients o2 sat dropped down to 87%. Once back in the room patient was placed back on [place] at 2.5lpm and her o2 rose to 100%
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
## 2024-05-04 NOTE — H&P (Signed)
 " History and Physical  Bianca Wolf:[id number] DOB: [birth year] DOA: 05/04/2024  PCP: Bianca Nottingham, MD   Chief Complaint: Shortness of breath  HPI: Bianca Wolf is a 84 y.o. female with medical history significant for paroxysmal A-fib s/p ablation and Watchman procedure  not on OAC, HTN, COPD and GERD who presented to the ED for evaluation of shortness of breath.  Patient reports she was seen in the hospital for COPD exacerbation over a month ago and discharged home on oxygen .  She started feeling better so she send the oxygen  back.  Over the last 2 weeks, she has had gradual increase in shortness of breath with progressive dyspnea on exertion.  This morning, she woke up with significant shortness of breath and decided to come to the ED for further evaluation.  She endorses a mild cough and leg swelling, right more than left but denies any chest pain, nausea, vomiting, abdominal pain, fever, chills or dysuria.  ED Course: Initial vitals show patient afebrile, RR 14-22, HR 40-50s, SBP 170s-190s, SpO2 99% on 3 L [place]. Initial labs significant for K+ 3.3, proBNP 3000, troponin 22-23, Hgb 10.1, negative flu/RSV/COVID test. EKG shows junctional bradycardia. CXR shows bibasilar interstitial and airspace opacities with mild pulmonary edema. Pt received IV Lasix  40 mg x 1, IV hydralazine  10 mg x 1 and KCl 40 mEq x 1. TRH was consulted for admission.   Review of Systems: Please see HPI for pertinent positives and negatives. A complete 10 system review of systems are otherwise negative.  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 Cindie   Past Surgical History:  Procedure Laterality Date   BUBBLE STUDY  02/09/2022   Procedure: BUBBLE STUDY;   Surgeon: Bianca Soyla LABOR, MD;  Location: [facility] ENDOSCOPY;  Service: Cardiovascular;;   CARDIOVERSION N/A 02/09/2022   Procedure: CARDIOVERSION;  Surgeon: Bianca Soyla LABOR, MD;  Location: [facility] ENDOSCOPY;  Service: Cardiovascular;  Laterality: N/A;   CARDIOVERSION N/A 10/01/2022   Procedure: CARDIOVERSION;  Surgeon: Bianca Wilbert SAUNDERS, MD;  Location: [facility] INVASIVE CV LAB;  Service: Cardiovascular;  Laterality: N/A;   IR KYPHO LUMBAR INC FX REDUCE BONE BX UNI/BIL CANNULATION INC/IMAGING  09/21/2021   LEFT ATRIAL APPENDAGE OCCLUSION N/A 02/14/2023   Procedure: LEFT ATRIAL APPENDAGE OCCLUSION;  Surgeon: Bianca Ole DASEN, MD;  Location: [facility] INVASIVE CV LAB;  Service: Cardiovascular;  Laterality: N/A;   TEE WITHOUT CARDIOVERSION N/A 02/09/2022   Procedure: TRANSESOPHAGEAL ECHOCARDIOGRAM (TEE);  Surgeon: Bianca Soyla LABOR, MD;  Location: [facility] ENDOSCOPY;  Service: Cardiovascular;  Laterality: N/A;   TEE WITHOUT CARDIOVERSION N/A 02/14/2023   Procedure: TRANSESOPHAGEAL ECHOCARDIOGRAM;  Surgeon: Bianca Ole DASEN, MD;  Location: [facility] INVASIVE CV LAB;  Service: Cardiovascular;  Laterality: N/A;   Social History:  reports that she quit smoking about 46 years ago. Her smoking use included cigarettes. She started smoking about 71 years ago. She has a 25 pack-year smoking history. She has never used smokeless tobacco. She reports that she does not drink alcohol and does not use drugs.  Allergies[1]  Family History  Problem Relation Age of Onset   Hypertension Mother    Diabetes Mother    Heart disease Father    Hypertension Father    Atrial fibrillation Neg  Hx      Prior to Admission medications  Medication Sig Start Date End Date Taking? Authorizing Provider  albuterol  (VENTOLIN  HFA) 108 (90 Base) MCG/ACT inhaler Inhale 2 puffs into the lungs every 6 (six) hours as needed for wheezing or shortness of breath. 11/07/23   Bianca Dorn NOVAK, MD  amiodarone  (PACERONE ) 200 MG tablet Take 1 tablet (200 mg total) by  mouth daily. 06/21/23   Terra Fairy PARAS, PA-C  cyanocobalamin  2000 MCG tablet Take 2,000 mcg by mouth daily.    [provider]  furosemide  (LASIX ) 20 MG tablet TAKE 1 TABLET BY MOUTH ONCE DAILY AS NEEDED FOR  WEIGHT  INCREASE  OF  3LBS  OVERNIGHT  OR  5LBS  IN  1  WEEK 09/19/23   Bianca Lonni CROME, MD  ibuprofen (ADVIL) 200 MG tablet Take 400 mg by mouth daily as needed for mild pain (pain score 1-3).    [provider]  metoprolol  succinate (TOPROL -XL) 25 MG 24 hr tablet Take 1 tablet (25 mg total) by mouth daily. Take with or immediately following a meal 03/17/24   Bianca Birmingham, PA-C  olmesartan (BENICAR) 20 MG tablet Take 20 mg by mouth daily. 03/24/24   [provider]  oxyCODONE  (OXY IR/ROXICODONE ) 5 MG immediate release tablet Take 1-2 tablets (5-10 mg total) by mouth every 6 (six) hours as needed for moderate pain (pain score 4-6), breakthrough pain or severe pain (pain score 7-10). 03/30/24   Bianca Lenis, MD  Tiotropium Bromide-Olodaterol (STIOLTO RESPIMAT ) 2.5-2.5 MCG/ACT AERS Inhale 2 puffs into the lungs daily. 01/21/24   Bianca Dorn NOVAK, MD    Physical Exam: BP (!) 182/65   Pulse (!) 55   Temp 98.1 F (36.7 C) (Oral)   Resp 19   Ht 5' 3 (1.6 m)   Wt 71.8 kg   SpO2 100%   BMI 28.04 kg/m  General: Pleasant, well-appearing elderly woman laying in bed. No acute distress. HEENT: [place]/AT. Anicteric sclera CV: Bradycardic. Regular rhythm. No murmurs, rubs, or gallops. Pulmonary: 2.5 L [place]. Lungs CTAB. Normal effort. Bibasilar rales. Abdominal: Soft, nontender, nondistended. Normal bowel sounds. Extremities: 1+ BLE edema R>L. Palpable radial and DP pulses. Normal ROM. Skin: Warm and dry. No obvious rash or lesions. Neuro: A&Ox3. Moves all extremities. Normal sensation to light touch. No focal deficit. Psych: Normal mood and affect          Labs on Admission:  Basic Metabolic Panel: Recent Labs  Lab 05/04/24 1512  NA 142  K 3.3*  CL 103   CO2 28  GLUCOSE 90  BUN 8  CREATININE 0.92  CALCIUM 8.8*   Liver Function Tests: Recent Labs  Lab 05/04/24 1512  AST 16  ALT 9  ALKPHOS 86  BILITOT 0.9  PROT 7.1  ALBUMIN 3.8   No results for input(s): LIPASE, AMYLASE in the last 168 hours. No results for input(s): AMMONIA in the last 168 hours. CBC: Recent Labs  Lab 05/04/24 1512  WBC 6.5  NEUTROABS 5.2  HGB 10.1*  HCT 33.0*  MCV 97.1  PLT 222   Cardiac Enzymes: No results for input(s): CKTOTAL, CKMB, CKMBINDEX, TROPONINI in the last 168 hours. BNP (last 3 results) No results for input(s): BNP in the last 8760 hours.  ProBNP (last 3 results) Recent Labs    05/04/24 1704  PROBNP 3,051.0*    CBG: No results for input(s): GLUCAP in the last 168 hours.  Radiological Exams on Admission: DG Chest 2 View Result Date: 05/04/2024  EXAM: 2 VIEW(S) XRAY OF THE CHEST 05/04/2024 02:11:00 PM COMPARISON: 03/28/2024 CLINICAL HISTORY: SOB FINDINGS: LINES, TUBES AND DEVICES: Left atrial appendage occlusion device noted. LUNGS AND PLEURA: Bibasilar interstitial and airspace opacities. Mild pulmonary edema. Small bilateral pleural effusions. No pneumothorax. HEART AND MEDIASTINUM: Mild cardiomegaly. Aortic atherosclerosis. BONES AND SOFT TISSUES: Multilevel thoracic osteophytosis. Chronic right posterior rib fracture. IMPRESSION: 1. Bibasilar interstitial and airspace opacities with mild pulmonary edema and small bilateral pleural effusions. 2. Mild cardiomegaly. Electronically signed by: Waddell Calk MD MD 05/04/2024 03:42 PM EST RP Workstation: [id number]   Assessment/Plan SHAOLIN ARMAS is a 84 y.o. female with medical history significant for paroxysmal A-fib s/p ablation and Watchman procedure  not on OAC, HTN, COPD and GERD who presented to the ED for evaluation of shortness of breath and admitted for acute hypoxic respiratory failure  # Acute hypoxic respiratory failure - Patient previously on 2 L [place] now  presenting with progressive shortness of breath found to have new O2 requirement of up to 3 L [place] - This is in the setting of CHF exacerbation - Continue supplemental O2, wean as able  # Acute on chronic combined systolic and diastolic HF - Last TTE on 05/2022 shows EF improved to 65-60%, moderate LVH, G2DD, moderately elevated PASP and severely dilated LA - Pt presented with progressive shortness of breath, dyspnea on exertion  and lower extremity edema - Pt with clinical, radiological and laboratory signs of CHF exacerbation - Acute CHF likely 2/2 underdiuresing as patient has not been taking her prescribed as needed Lasix  versus worsening hypertension - Start IV lasix  40 mg daily - Continue olmesartan (Irbesartan  as substitute) - Follow up repeat echocardiogram - Strict I&O, daily weights - Maintain K+ > 4.0, Mag > 2.0 - Telemetry  # Paroxysmal A-fib - Status post ablation and Watchman procedure in 2024 - Currently in junctional rhythm, rate in the 40s to 50s - Continue amiodarone  and hold metoprolol  - Telemetry  # Bradycardia - Patient with persistent bradycardia with HR in the 40s and 50s since admission - She denies any dizziness, lightheadedness or recent syncope, troponin flat - Patient currently on Toprol  XL 25 mg daily, likely the cause of her bradycardia - Will hold her beta-blocker and check mag and TSH - Follow-up TTE - Consider cardiology consult if no further improvement after holding beta-blocker for at least 24 hours  # Hypertensive urgency - BP significantly elevated to SBP in the 170s to 190s - Continue olmesartan (Irbesartan  as substitute) - IV hydralazine  as needed for SBP > 180-  # COPD - No wheezing or increased productive cough to indicate an acute exacerbation - Resume home bronchodilator - As needed DuoNeb  # Hypokalemia - K+ low at 3.3 on admission, treated with KCl 40 mEq x 1 in the ED - F/u morning potassium and mag  DVT prophylaxis: Lovenox       Code Status: Limited: Do not attempt resuscitation (DNR) -DNR-LIMITED -Do Not Intubate/DNI   Consults called: None  Family Communication: No family at bedside  Severity of Illness: The appropriate patient status for this patient is INPATIENT. Inpatient status is judged to be reasonable and necessary in order to provide the required intensity of service to ensure the patient's safety. The patient's presenting symptoms, physical exam findings, and initial radiographic and laboratory data in the context of their chronic comorbidities is felt to place them at high risk for further clinical deterioration. Furthermore, it is not anticipated that the patient will be medically stable for  discharge from the hospital within 2 midnights of admission.   * I certify that at the point of admission it is my clinical judgment that the patient will require inpatient hospital care spanning beyond 2 midnights from the point of admission due to high intensity of service, high risk for further deterioration and high frequency of surveillance required.*  Level of care: Telemetry   I personally spent a total of 75 minutes in the care of the patient today including preparing to see the patient, getting/reviewing separately obtained history, performing a medically appropriate exam/evaluation, placing orders, and documenting clinical information in the EHR.   Lou Claretta HERO, MD 05/05/2024, 1:48 AM [facility] Pager: [phone] Isaiah 41:10   If 7PM-7AM, please contact night-coverage [email or web address] Password [email or web address]     [1] No Known Allergies  "

## 2024-05-04 NOTE — ED Triage Notes (Signed)
 Pt here for sob.  Was admitted and dx with COPD approx 1 month ago.  Was discharged home with O2.  After 2 weeks, pt began feeling better, so sent the O2 back.  Shortly after that, the sob came back.  She ordered more O2, but it has not arrived yet.  Sats of 88% on RA while sitting.  Breathing labored.  Pt denies chest pain, nausea.
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
## 2024-05-04 NOTE — Telephone Encounter (Signed)
 Noted.  Patient going to ED.  She will need an office visit and walk test in the office to qualify for home oxygen  and POC.  She has not been seen in our office since 01/19/25.

## 2024-05-04 NOTE — ED Provider Notes (Signed)
 " [place] EMERGENCY DEPARTMENT AT [facility] Provider Note   CSN: [id number] Arrival date & time: 05/04/24  1317     Patient presents with: Shortness of Breath   Bianca Wolf is a 84 y.o. female.   84 year old female presenting with shortness of breath.  Patient was discharged from the hospital on 12/8 for a COPD exacerbation, during that hospitalization she was found to be hypoxic and was placed on oxygen , she was discharged on oxygen  as well but admits that she only used this for several days at home before asking that her oxygen  supplies be returned, stating I did not feel that I needed it anymore.  Patient notes since that time she has become increasingly short of breath at rest and with exertion, reports that the shortness of breath woke me up out of my sleep this morning and is now associated with a mildly productive cough.  No fever at home, no chest pain.  Patient is on several medications for management of her COPD, reports I don't know if I am using them right.    Shortness of Breath      Prior to Admission medications  Medication Sig Start Date End Date Taking? Authorizing Provider  albuterol  (VENTOLIN  HFA) 108 (90 Base) MCG/ACT inhaler Inhale 2 puffs into the lungs every 6 (six) hours as needed for wheezing or shortness of breath. 11/07/23   Kara Dorn NOVAK, MD  amiodarone  (PACERONE ) 200 MG tablet Take 1 tablet (200 mg total) by mouth daily. 06/21/23   Terra Fairy PARAS, PA-C  cyanocobalamin  2000 MCG tablet Take 2,000 mcg by mouth daily.    [provider]  furosemide  (LASIX ) 20 MG tablet TAKE 1 TABLET BY MOUTH ONCE DAILY AS NEEDED FOR  WEIGHT  INCREASE  OF  3LBS  OVERNIGHT  OR  5LBS  IN  1  WEEK 09/19/23   Kate Lonni CROME, MD  ibuprofen (ADVIL) 200 MG tablet Take 400 mg by mouth daily as needed for mild pain (pain score 1-3).    [provider]  metoprolol  succinate (TOPROL -XL) 25 MG 24 hr tablet Take 1 tablet (25 mg total) by  mouth daily. Take with or immediately following a meal 03/17/24   Trudy Birmingham, PA-C  olmesartan (BENICAR) 20 MG tablet Take 20 mg by mouth daily. 03/24/24   [provider]  oxyCODONE  (OXY IR/ROXICODONE ) 5 MG immediate release tablet Take 1-2 tablets (5-10 mg total) by mouth every 6 (six) hours as needed for moderate pain (pain score 4-6), breakthrough pain or severe pain (pain score 7-10). 03/30/24   Patsy Lenis, MD  Tiotropium Bromide-Olodaterol (STIOLTO RESPIMAT ) 2.5-2.5 MCG/ACT AERS Inhale 2 puffs into the lungs daily. 01/21/24   Kara Dorn NOVAK, MD    Allergies: Patient has no known allergies.    Review of Systems  Respiratory:  Positive for shortness of breath.     Updated Vital Signs  Vitals:   05/04/24 1516 05/04/24 1638 05/04/24 1818 05/04/24 1836  BP: 113/76 (!) 182/125 (!) 163/89 (!) 230/206  Pulse: (!) 47 (!) 48 (!) 49   Resp: 18 20 18    Temp: 97.6 F (36.4 C)     TempSrc: Oral     SpO2: 97% 99% 100%   Weight:      Height:         Physical Exam Vitals and nursing note reviewed.  HENT:     Head: Normocephalic.     Mouth/Throat:     Mouth: Mucous membranes are moist.  Eyes:     Extraocular Movements: Extraocular movements intact.     Pupils: Pupils are equal, round, and reactive to light.  Cardiovascular:     Rate and Rhythm: Regular rhythm. Bradycardia present.  Pulmonary:     Effort: Pulmonary effort is normal. No respiratory distress.     Breath sounds: Normal breath sounds.     Comments: Speaking in full/clear sentences On 2L [place] Musculoskeletal:     Cervical back: Normal range of motion.     Right lower leg: Edema (trace pitting) present.     Left lower leg: Edema (trace pitting) present.     Comments: Moves all extremities spontaneously without difficulty  Skin:    General: Skin is warm and dry.  Neurological:     General: No focal deficit present.     Mental Status: She is alert and oriented to person, place, and time.     (all  labs ordered are listed, but only abnormal results are displayed) Labs Reviewed  CBC WITH DIFFERENTIAL/PLATELET - Abnormal; Notable for the following components:      Result Value   RBC 3.40 (*)    Hemoglobin 10.1 (*)    HCT 33.0 (*)    All other components within normal limits  COMPREHENSIVE METABOLIC PANEL WITH GFR - Abnormal; Notable for the following components:   Potassium 3.3 (*)    Calcium 8.8 (*)    All other components within normal limits  TROPONIN T, HIGH SENSITIVITY - Abnormal; Notable for the following components:   Troponin T High Sensitivity 22 (*)    All other components within normal limits  RESP PANEL BY RT-PCR (RSV, FLU A&B, COVID)  [id number]  TROPONIN T, HIGH SENSITIVITY    EKG: EKG Interpretation Date/Time:  Monday May 04 2024 13:45:17 EST Ventricular Rate:  46 PR Interval:    QRS Duration:  96 QT Interval:  386 QTC Calculation: 337 R Axis:   32  Text Interpretation: Junctional rhythm ST & T wave abnormality, consider lateral ischemia Abnormal ECG When compared with ECG of 28-Mar-2024 13:09, PREVIOUS ECG IS PRESENT Confirmed by Laurice Coy [phone]) on 05/04/2024 4:26:14 PM  Radiology: DG Chest 2 View Result Date: 05/04/2024 EXAM: 2 VIEW(S) XRAY OF THE CHEST 05/04/2024 02:11:00 PM COMPARISON: 03/28/2024 CLINICAL HISTORY: SOB FINDINGS: LINES, TUBES AND DEVICES: Left atrial appendage occlusion device noted. LUNGS AND PLEURA: Bibasilar interstitial and airspace opacities. Mild pulmonary edema. Small bilateral pleural effusions. No pneumothorax. HEART AND MEDIASTINUM: Mild cardiomegaly. Aortic atherosclerosis. BONES AND SOFT TISSUES: Multilevel thoracic osteophytosis. Chronic right posterior rib fracture. IMPRESSION: 1. Bibasilar interstitial and airspace opacities with mild pulmonary edema and small bilateral pleural effusions. 2. Mild cardiomegaly. Electronically signed by: Waddell Calk MD MD 05/04/2024 03:42 PM EST RP Workstation: [id number]     Procedures    Medications Ordered in the ED  furosemide  (LASIX ) injection 40 mg (40 mg Intravenous Given 05/04/24 1814)  hydrALAZINE  (APRESOLINE ) injection 10 mg (10 mg Intravenous Given 05/04/24 1836)  potassium chloride  SA (KLOR-CON  M) CR tablet 40 mEq (40 mEq Oral Given 05/04/24 1835)                                    Medical Decision Making This patient presents to the ED for concern of shortness of breath, this involves an extensive number of treatment options, and is a complaint that carries with it a high risk of complications and morbidity.  The differential  diagnosis includes COPD exacerbation, CHF exacerbation, acute hypoxic respiratory failure, medication noncompliance   Co morbidities that complicate the patient evaluation  CHF, COPD exacerbation   Additional history obtained:  Additional history obtained from record review External records from outside source obtained and reviewed including prior hospital discharge summary    Lab Tests:  I Ordered, and personally interpreted labs.  The pertinent results include: CBC unremarkable, no leukocytosis, hemoglobin of 10.1 is largely stable from previous.  CMP notable for borderline hypokalemia with potassium of 3.3, otherwise unremarkable. Initial troponin 22, repeat flat at 23.  BNP 3051, no baseline for comparison.   Imaging Studies ordered:  I ordered imaging studies including CXR  I independently visualized and interpreted imaging which showed 1. Bibasilar interstitial and airspace opacities with mild pulmonary edema and small bilateral pleural effusions. 2. Mild cardiomegaly.  I agree with the radiologist interpretation   Cardiac Monitoring: / EKG:  The patient was maintained on a cardiac monitor.  I personally viewed and interpreted the cardiac monitored which showed an underlying rhythm of: sinus bradycardia    Consultations Obtained:  I requested consultation with the hospitalist,  and discussed lab and imaging findings  as well as pertinent plan - they recommend: I spoke with Dr. Lou with the hospitalist service who agrees that this patient is appropriate for admission   Problem List / ED Course / Critical interventions / Medication management  I ordered medication including IV Lasix   for suspected CHF exacerbation/need for diuresis, IV hydralazine  for HTN, PO potassium to prevent worsening hypokalemia in the setting of IV diuresis  I have reviewed the patients home medicines and have made adjustments as needed   Social Determinants of Health:  Former tobacco use    Test / Admission - Considered:  Physical exam is notable as above, patient on 2L [place] which was supposed to be her baseline, however she discontinued her home oxygen  as she did not feel that she needed it, since that time she has been feeling short of breath at rest and with exertion.  Patient does not appear grossly fluid volume overloaded on exam, however x-ray imaging is notable as above for pulmonary edema/bilateral pleural effusions raising concern for CHF exacerbation needing IV diuresis.  Patient has a BNP over 3000, admits that she does not take Lasix  at home and was unaware that this medication was prescribed to her.  I suspect that patient's fluid volume overload may be contributing to her shortness of breath, patient was hypoxic at 88% on arrival but this has normalized since administration of oxygen .  Troponin elevated but flat at 22 and 23 respectively, likely secondary to demand ischemia. Patient also found to be significantly hypertensive in the emergency department today, with systolic BP readings as high as 230.  Unclear if patient is compliant with her home medications, as she has difficulty recalling the names of the medication she takes and what she is actually currently taking.  BP was checked manually by nursing staff after BP 230/206 was detected using automatic cuff, this is likely falsely elevated as manual BP reading was  204/110. IV hydralazine  given. Given need for oxygen  as well as evidence of CHF exacerbation and significantly elevated blood pressure, I do feel that this patient is appropriate for admission.  I discussed her case with the hospitalist service as above who is in agreement with this plan.     Risk Prescription drug management. Decision regarding hospitalization.        Final diagnoses:  Acute on chronic congestive heart failure, unspecified heart failure type (HCC)  Hypertension, unspecified type  Acute hypoxic respiratory failure [facility])    ED Discharge Orders     None          Bianca Wolf 05/04/24 1849    Laurice Maude BROCKS, MD 05/04/24 2104  "

## 2024-05-04 NOTE — ED Provider Triage Note (Signed)
 Emergency Medicine Provider Triage Evaluation Note  Bianca Wolf , a 84 y.o. female  was evaluated in triage.  Pt complains of SOB over the past few days.  Patient was admitted at the beginning of December for COPD exacerbation was discharged home on oxygen .  She started to feel better and did not like having to log the oxygen  around often so returned the oxygen .  She reports that she had to feel short of breath about 2 to 3 days later has not feeling short of breath for the past 2 to 3 days.  Denies any chest pain or fever.  Review of Systems  Positive:  Negative:   Physical Exam  SpO2 (!) 88%  Gen:   Awake, no distress   Resp:  Normal effort  MSK:   Moves extremities without difficulty  Other:  Lungs slightly diminished in the lower extremities  Medical Decision Making  Medically screening exam initiated at 1:43 PM.  Appropriate orders placed.  Bianca Wolf was informed that the remainder of the evaluation will be completed by another provider, this initial triage assessment does not replace that evaluation, and the importance of remaining in the ED until their evaluation is complete.  Now on O2. Does not appear in any acute distress. Basic workup initiatied, however patient may benefit from further workup.    Bianca Ernst, PA-C 05/04/24 1346

## 2024-05-05 ENCOUNTER — Inpatient Hospital Stay (HOSPITAL_COMMUNITY)

## 2024-05-05 DIAGNOSIS — E876 Hypokalemia: Secondary | ICD-10-CM

## 2024-05-05 DIAGNOSIS — R001 Bradycardia, unspecified: Secondary | ICD-10-CM

## 2024-05-05 DIAGNOSIS — I160 Hypertensive urgency: Secondary | ICD-10-CM

## 2024-05-05 DIAGNOSIS — R0609 Other forms of dyspnea: Secondary | ICD-10-CM

## 2024-05-05 DIAGNOSIS — I5043 Acute on chronic combined systolic (congestive) and diastolic (congestive) heart failure: Secondary | ICD-10-CM

## 2024-05-05 LAB — BASIC METABOLIC PANEL WITH GFR
Anion gap: 10 (ref 5–15)
BUN: 8 mg/dL (ref 8–23)
CO2: 31 mmol/L (ref 22–32)
Calcium: 8.6 mg/dL — ABNORMAL LOW (ref 8.9–10.3)
Chloride: 103 mmol/L (ref 98–111)
Creatinine, Ser: 0.91 mg/dL (ref 0.44–1.00)
GFR, Estimated: >60 mL/min
Glucose, Bld: 91 mg/dL (ref 70–99)
Potassium: 3.6 mmol/L (ref 3.5–5.1)
Sodium: 143 mmol/L (ref 135–145)

## 2024-05-05 LAB — CBC
HCT: 31.1 % — ABNORMAL LOW (ref 36.0–46.0)
Hemoglobin: 9.7 g/dL — ABNORMAL LOW (ref 12.0–15.0)
MCH: 30.4 pg (ref 26.0–34.0)
MCHC: 31.2 g/dL (ref 30.0–36.0)
MCV: 97.5 fL (ref 80.0–100.0)
Platelets: 204 K/uL (ref 150–400)
RBC: 3.19 MIL/uL — ABNORMAL LOW (ref 3.87–5.11)
RDW: 14.6 % (ref 11.5–15.5)
WBC: 7.5 K/uL (ref 4.0–10.5)
nRBC: 0 % (ref 0.0–0.2)

## 2024-05-05 LAB — ECHOCARDIOGRAM COMPLETE
AR max vel: 2.08 cm2
AV Area VTI: 2.1 cm2
AV Area mean vel: 2.07 cm2
AV Mean grad: 9 mmHg
AV Peak grad: 17.1 mmHg
Ao pk vel: 2.07 m/s
Area-P 1/2: 2.87 cm2
Height: 63 in
S' Lateral: 2.5 cm
Weight: 2532.64 [oz_av]

## 2024-05-05 LAB — TSH: TSH: 3.36 u[IU]/mL (ref 0.350–4.500)

## 2024-05-05 MED ORDER — IRBESARTAN 300 MG PO TABS
150.0000 mg | ORAL_TABLET | Freq: Every day | ORAL | Status: DC
  Administered 2024-05-05 – 2024-05-06 (×2): 150 mg via ORAL
  Filled 2024-05-05 (×2): qty 1

## 2024-05-05 MED ORDER — FUROSEMIDE 10 MG/ML IJ SOLN
40.0000 mg | Freq: Every day | INTRAMUSCULAR | Status: DC
  Administered 2024-05-05 – 2024-05-06 (×2): 40 mg via INTRAVENOUS
  Filled 2024-05-05 (×2): qty 4

## 2024-05-05 MED ORDER — AMIODARONE HCL 200 MG PO TABS
200.0000 mg | ORAL_TABLET | Freq: Every day | ORAL | Status: DC
  Administered 2024-05-05: 200 mg via ORAL
  Filled 2024-05-05: qty 1

## 2024-05-05 MED ORDER — ARFORMOTEROL TARTRATE 15 MCG/2ML IN NEBU
15.0000 ug | INHALATION_SOLUTION | Freq: Two times a day (BID) | RESPIRATORY_TRACT | Status: DC
  Administered 2024-05-05 – 2024-05-06 (×3): 15 ug via RESPIRATORY_TRACT
  Filled 2024-05-05 (×3): qty 2

## 2024-05-05 MED ORDER — UMECLIDINIUM BROMIDE 62.5 MCG/ACT IN AEPB
1.0000 | INHALATION_SPRAY | Freq: Every day | RESPIRATORY_TRACT | Status: DC
  Administered 2024-05-05 – 2024-05-06 (×2): 1 via RESPIRATORY_TRACT
  Filled 2024-05-05: qty 7

## 2024-05-05 MED ORDER — HYDRALAZINE HCL 20 MG/ML IJ SOLN
10.0000 mg | Freq: Four times a day (QID) | INTRAMUSCULAR | Status: DC | PRN
  Administered 2024-05-05: 10 mg via INTRAVENOUS
  Filled 2024-05-05: qty 1

## 2024-05-05 MED ORDER — IPRATROPIUM-ALBUTEROL 0.5-2.5 (3) MG/3ML IN SOLN: 3.0000 mL | Freq: Four times a day (QID) | RESPIRATORY_TRACT | Status: DC | PRN

## 2024-05-05 NOTE — Progress Notes (Signed)
 2D Echocardiogram performed.

## 2024-05-05 NOTE — ED Notes (Signed)
 Empty patient bedside toilet patient is now sitting up eating lunch patient has family at bedside and call bell in reach

## 2024-05-05 NOTE — Progress Notes (Signed)
 " PROGRESS NOTE    [name]  FMW:[id number] DOB: 1941/03/11 DOA: 05/04/2024 PCP: Clarice Nottingham, MD   Brief Narrative:  Bianca Wolf is a 84 y.o. female with medical history significant for paroxysmal A-fib s/p ablation and Watchman procedure  not on OAC, HTN, COPD and GERD who presented to the ED for evaluation of shortness of breath.  Patient reports she was seen in the hospital for COPD exacerbation over a month ago and discharged home on oxygen .  She started feeling better so she sent the oxygen  back.  Over the last 2 weeks, she has had gradual increase in shortness of breath with progressive dyspnea on exertion and woke up on the morning of 05/04/2024 with significant shortness of breath so decided come to the ED for further evaluation.  She is noted to be bradycardic in the ED and had to be placed on 3 L supplemental oxygen .  Initial labs were significant forr K+ 3.3, proBNP 3000, troponin 22-23, Hgb 10.1, negative flu/RSV/COVID test. EKG shows junctional bradycardia. CXR shows bibasilar interstitial and airspace opacities with mild pulmonary edema. Pt received IV Lasix  40 mg x 1, IV hydralazine  10 mg x 1 and KCl 40 mEq x 1. TRH excepted this patient for admission.    **Interim History: Echocardiogram has been ordered and performed and she is being continued on diuresis with daily Lasix .  Remains a little bradycardic we will continue to monitor.  She will need an ambulatory home O2 screen prior to discharge as well as evaluation by PT and OT.  Assessment and Plan:  Acute Respiratory Failure with Hypoxia:  Patient previously on 2 L [place] now presenting with progressive shortness of breath found to have new O2 requirement of up to 3 L [place]. This is most likley in the setting of CHF exacerbation. Continue supplemental O2, wean as able.  Repeat chest x-ray in a.m. and she will need an amatory home O2 screen prior to discharge.  Nebs and bronchodilators as below   Acute on chronic combined systolic  and diastolic HF: Last TTE on 05/2022 shows EF improved to 65-60%, moderate LVH, G2DD, moderately elevated PASP and severely dilated LA -Pt presented with progressive shortness of breath, dyspnea on exertion  and lower extremity edema and an elevated proBNP of 3051.0 -Pt with clinical, radiological and laboratory signs of CHF exacerbation -Acute CHF likely 2/2 underdiuresing as patient has not been taking her prescribed as needed Lasix  versus worsening hypertension -C/w IV lasix  40 mg daily - Continue olmesartan (Irbesartan  as substitute) - Follow up repeat echocardiogram continues to show recovered EF but showing G2DD, no RWMA , Moderate Concentric LV. -CTM Strict I&O, daily weights -Maintain K+ > 4.0, Mag > 2.0 -Telemetry   Paroxysmal A-fib: Status post ablation and Watchman procedure in 2024.  Currently in junctional rhythm, rate in the 40s to 50s. Discontinued Amiodarone  200 mg po Daily and Metoprolol . CTM on Telemetry  Bradycardia: Patient with persistent bradycardia with HR in the 40s and 50s since admission. She denies any dizziness, lightheadedness or recent syncope, troponin flat. - Patient currently on Toprol  XL 25 mg daily, likely the cause of her bradycardia and will continue to hold. TSH was 3.360. TTE showed LVEF of 60-65% and no Regional Wall Motion Abnormalities, Moderate Concentric LVH and G2DD. CTM on Telemetry. May need Cardiology consultation if no further improvement after holding beta-blocker for at least 24 hours.    Hypertensive Urgency: BP significantly elevated to SBP in the 170s to 190s on admission.  Continue olmesartan (Irbesartan  150 mg po Daily as substitute). Hold BB. C/w IV Hydralazine  10 mg q6hprn SBP >180 or DBP >120. CTM BP per Protocol. Last BP reading was 168/58   COPD: No wheezing or increased productive cough to indicate an acute exacerbation. Resume home bronchodilator with Brovana  plus Incruse Ellipta  and as needed DuoNebs  Normocytic Anemia: Hgb/Hct  Trend:  Recent Labs  Lab 05/04/24 1512 05/05/24 0237  HGB 10.1* 9.7*  HCT 33.0* 31.1*  MCV 97.1 97.5  -Check Anemia Panel in the AM. CTM for S/Sx of Bleeding; No overt bleeding noted. Repeat CBC in the AM   Hypokalemia: K+ improved and went from 3.3 ->3.6. CTM & Replete as Necessary. Repeat CMP in hte AM and order Mag Level   Overweight: Complicates overall prognosis and care. Estimated body mass index is 26.44 kg/m as calculated from the following:   Height as of this encounter: 5' 3 (1.6 m).   Weight as of this encounter: 67.7 kg. Weight Loss and Dietary Counseling given   DVT prophylaxis: enoxaparin  (LOVENOX ) injection 40 mg Start: 05/04/24 2100 Place TED hose Start: 05/04/24 2002    Code Status: Limited: Do not attempt resuscitation (DNR) -DNR-LIMITED -Do Not Intubate/DNI  Family Communication: D/w Family present @ bedside  Disposition Plan:  Level of care: Telemetry Status is: Inpatient Remains inpatient appropriate because: Needs further clinical improvement and continue diuresis and will need PT and OT evaluation and possible cardiology consult if bradycardia does not resolve   Consultants:  None  Procedures:  Echocardiogram  Antimicrobials:  Anti-infectives (From admission, onward)    None       Subjective: Seen and examined at bedside and thinks he is doing okay.  Continues to have some shortness of breath but feels better than yesterday.  No lightheadedness or dizziness.  Getting an echocardiogram now.  Asking when she can leave and go home.  No other concerns or complaints this time.  Objective: Vitals:   05/05/24 1407 05/05/24 1500 05/05/24 1920 05/05/24 1942  BP: (!) 114/91 (!) 186/62 (!) 168/58   Pulse: (!) 51 (!) 55 (!) 44   Resp: 20 20 20    Temp: 98.8 F (37.1 C) 98.2 F (36.8 C) 98 F (36.7 C)   TempSrc: Oral Oral Oral   SpO2: 93% 99% 97% 97%  Weight:  67.7 kg    Height:  5' 3 (1.6 m)      Intake/Output Summary (Last 24 hours) at  05/05/2024 2209 Last data filed at 05/05/2024 1700 Gross per 24 hour  Intake 60 ml  Output 300 ml  Net -240 ml   Filed Weights   05/04/24 1345 05/05/24 1500  Weight: 71.8 kg 67.7 kg   Examination: Physical Exam:  Constitutional: WN/WD elderly overweight Caucasian female who is chronically appearing in no acute distress Respiratory: Diminished to auscultation bilaterally with some coarse breath sounds and has some Crackles but No Appreciable Wheezing, Rales or Rhonchi.  Has a Normal respiratory effort and patient is not tachypenic. No accessory muscle use.  Wearing supplemental oxygen  nasal cannula Cardiovascular: Bradycardic rate, no murmurs / rubs / gallops. S1 and S2 auscultated. Mild 1+ lower extremity edema worse on the right compared to left Abdomen: Soft, non-tender, distended secondary but habitus. Bowel sounds positive.  GU: Deferred. Musculoskeletal: No clubbing / cyanosis of digits/nails. No joint deformity upper and lower extremities. Skin: No rashes, lesions, ulcers Skin evaluation. No induration; Warm and dry.  Neurologic: CN 2-12 grossly intact with no focal deficits. Romberg sign  c and erebellar reflexes not assessed.  Psychiatric: Normal judgment and insight. Alert and oriented x 3. Normal mood and appropriate affect.   Data Reviewed: I have personally reviewed following labs and imaging studies  CBC: Recent Labs  Lab 05/04/24 1512 05/05/24 0237  WBC 6.5 7.5  NEUTROABS 5.2  --   HGB 10.1* 9.7*  HCT 33.0* 31.1*  MCV 97.1 97.5  PLT 222 204   Basic Metabolic Panel: Recent Labs  Lab 05/04/24 1512 05/05/24 0237  NA 142 143  K 3.3* 3.6  CL 103 103  CO2 28 31  GLUCOSE 90 91  BUN 8 8  CREATININE 0.92 0.91  CALCIUM 8.8* 8.6*   GFR: Estimated Creatinine Clearance: 42.5 mL/min (by C-G formula based on SCr of 0.91 mg/dL). Liver Function Tests: Recent Labs  Lab 05/04/24 1512  AST 16  ALT 9  ALKPHOS 86  BILITOT 0.9  PROT 7.1  ALBUMIN 3.8   No results  for input(s): LIPASE, AMYLASE in the last 168 hours. No results for input(s): AMMONIA in the last 168 hours. Coagulation Profile: No results for input(s): INR, PROTIME in the last 168 hours. Cardiac Enzymes: No results for input(s): CKTOTAL, CKMB, CKMBINDEX, TROPONINI in the last 168 hours. BNP (last 3 results) Recent Labs    05/04/24 1704  PROBNP 3,051.0*   HbA1C: No results for input(s): HGBA1C in the last 72 hours. CBG: No results for input(s): GLUCAP in the last 168 hours. Lipid Profile: No results for input(s): CHOL, HDL, LDLCALC, TRIG, CHOLHDL, LDLDIRECT in the last 72 hours. Thyroid  Function Tests: Recent Labs    05/05/24 0237  TSH 3.360   Anemia Panel: No results for input(s): VITAMINB12, FOLATE, FERRITIN, TIBC, IRON, RETICCTPCT in the last 72 hours. Sepsis Labs: No results for input(s): PROCALCITON, LATICACIDVEN in the last 168 hours.  Recent Results (from the past 240 hours)  Resp panel by RT-PCR (RSV, Flu A&B, Covid) Anterior Nasal Swab     Status: None   Collection Time: 05/04/24  1:43 PM   Specimen: Anterior Nasal Swab  Result Value Ref Range Status   SARS Coronavirus 2 by RT PCR NEGATIVE NEGATIVE Final   Influenza A by PCR NEGATIVE NEGATIVE Final   Influenza B by PCR NEGATIVE NEGATIVE Final    Comment: (NOTE) The Xpert Xpress SARS-CoV-2/FLU/RSV plus assay is intended as an aid in the diagnosis of influenza from Nasopharyngeal swab specimens and should not be used as a sole basis for treatment. Nasal washings and aspirates are unacceptable for Xpert Xpress SARS-CoV-2/FLU/RSV testing.  Fact Sheet for Patients: bloggercourse.com  Fact Sheet for Healthcare Providers: seriousbroker.it  This test is not yet approved or cleared by the [place]  FDA and has been authorized for detection and/or diagnosis of SARS-CoV-2 by FDA under an Emergency Use  Authorization (EUA). This EUA will remain in effect (meaning this test can be used) for the duration of the COVID-19 declaration under Section 564(b)(1) of the Act, 21 U.S.C. section 360bbb-3(b)(1), unless the authorization is terminated or revoked.     Resp Syncytial Virus by PCR NEGATIVE NEGATIVE Final    Comment: (NOTE) Fact Sheet for Patients: bloggercourse.com  Fact Sheet for Healthcare Providers: seriousbroker.it  This test is not yet approved or cleared by the [place]  FDA and has been authorized for detection and/or diagnosis of SARS-CoV-2 by FDA under an Emergency Use Authorization (EUA). This EUA will remain in effect (meaning this test can be used) for the duration of the COVID-19 declaration under Section 564(b)(1) of  the Act, 21 U.S.C. section 360bbb-3(b)(1), unless the authorization is terminated or revoked.  Performed at [facility], [address] [place], [place] [address]     Radiology Studies: ECHOCARDIOGRAM COMPLETE Result Date: 05/05/2024    ECHOCARDIOGRAM REPORT   Patient Name:   Bianca Wolf Date of Exam: 05/05/2024 Medical Rec #:  [id number]      Height:       63.0 in Accession #:    [id number]     Weight:       158.3 lb Date of Birth:  01-27-1941      BSA:          1.751 m Patient Age:    83 years       BP:           172/71 mmHg Patient Gender: F              HR:           52 bpm. Exam Location:  Inpatient Procedure: 2D Echo, Cardiac Doppler and Color Doppler (Both Spectral and Color            Flow Doppler were utilized during procedure). Indications:    Dyspnea  History:        Patient has prior history of Echocardiogram examinations, most                 recent 02/07/2022. COPD, Arrythmias:Atrial Fibrillation; Risk                 Factors:Dyslipidemia and Hypertension.  Sonographer:    Carmelita Hartshorn RDCS, FE, PE Referring Phys: [phone] CLARETTA HERO AMPONSAH IMPRESSIONS  1. Left ventricular ejection  fraction, by estimation, is 60 to 65%. The left ventricle has normal function. The left ventricle has no regional wall motion abnormalities. There is moderate concentric left ventricular hypertrophy. Left ventricular diastolic parameters are consistent with Grade II diastolic dysfunction (pseudonormalization).  2. Right ventricular systolic function is normal. The right ventricular size is normal. There is mildly elevated pulmonary artery systolic pressure. The estimated right ventricular systolic pressure is 40.7 mmHg.  3. Left atrial size was moderately dilated.  4. The mitral valve is grossly normal. Mild mitral valve regurgitation. No evidence of mitral stenosis.  5. The aortic valve is grossly normal. Aortic valve regurgitation is not visualized. Aortic valve sclerosis is present, with no evidence of aortic valve stenosis.  6. The inferior vena cava is dilated in size with >50% respiratory variability, suggesting right atrial pressure of 8 mmHg. Comparison(s): Changes from prior study are noted. The left ventricular function has improved. FINDINGS  Left Ventricle: Left ventricular ejection fraction, by estimation, is 60 to 65%. The left ventricle has normal function. The left ventricle has no regional wall motion abnormalities. The left ventricular internal cavity size was normal in size. There is  moderate concentric left ventricular hypertrophy. Left ventricular diastolic parameters are consistent with Grade II diastolic dysfunction (pseudonormalization). Right Ventricle: The right ventricular size is normal. No increase in right ventricular wall thickness. Right ventricular systolic function is normal. There is mildly elevated pulmonary artery systolic pressure. The tricuspid regurgitant velocity is 2.86  m/s, and with an assumed right atrial pressure of 8 mmHg, the estimated right ventricular systolic pressure is 40.7 mmHg. Left Atrium: Left atrial size was moderately dilated. Right Atrium: Right atrial  size was normal in size. Pericardium: There is no evidence of pericardial effusion. Mitral Valve: The mitral valve is grossly normal. Mild mitral valve regurgitation.  No evidence of mitral valve stenosis. Tricuspid Valve: The tricuspid valve is grossly normal. Tricuspid valve regurgitation is mild . No evidence of tricuspid stenosis. Aortic Valve: The aortic valve is grossly normal. Aortic valve regurgitation is not visualized. Aortic valve sclerosis is present, with no evidence of aortic valve stenosis. Aortic valve mean gradient measures 9.0 mmHg. Aortic valve peak gradient measures 17.1 mmHg. Aortic valve area, by VTI measures 2.10 cm. Pulmonic Valve: The pulmonic valve was grossly normal. Pulmonic valve regurgitation is not visualized. No evidence of pulmonic stenosis. Aorta: The aortic root is normal in size and structure. Venous: The inferior vena cava is dilated in size with greater than 50% respiratory variability, suggesting right atrial pressure of 8 mmHg. IAS/Shunts: The atrial septum is grossly normal.  LEFT VENTRICLE PLAX 2D LVIDd:         3.70 cm   Diastology LVIDs:         2.50 cm   LV e' medial:    6.53 cm/s LV PW:         1.30 cm   LV E/e' medial:  16.5 LV IVS:        1.40 cm   LV e' lateral:   10.60 cm/s LVOT diam:     1.97 cm   LV E/e' lateral: 10.2 LV SV:         102 LV SV Index:   58 LVOT Area:     3.05 cm  RIGHT VENTRICLE             IVC RV Basal diam:  3.37 cm     IVC diam: 2.14 cm RV Mid diam:    2.72 cm RV S prime:     14.40 cm/s TAPSE (M-mode): 1.6 cm LEFT ATRIUM             Index        RIGHT ATRIUM           Index LA diam:        4.50 cm 2.57 cm/m   RA Area:     17.70 cm LA Vol (A2C):   62.8 ml 35.87 ml/m  RA Volume:   43.80 ml  25.02 ml/m LA Vol (A4C):   75.2 ml 42.95 ml/m LA Biplane Vol: 70.6 ml 40.33 ml/m  AORTIC VALVE AV Area (Vmax):    2.08 cm AV Area (Vmean):   2.07 cm AV Area (VTI):     2.10 cm AV Vmax:           207.00 cm/s AV Vmean:          134.000 cm/s AV VTI:             0.484 m AV Peak Grad:      17.1 mmHg AV Mean Grad:      9.0 mmHg LVOT Vmax:         141.00 cm/s LVOT Vmean:        90.800 cm/s LVOT VTI:          0.334 m LVOT/AV VTI ratio: 0.69  AORTA Ao Root diam: 2.79 cm MITRAL VALVE                TRICUSPID VALVE MV Area (PHT): 2.87 cm     TR Peak grad:   32.7 mmHg MV Decel Time: 264 msec     TR Vmax:        286.00 cm/s MV E velocity: 108.00 cm/s MV A velocity: 68.10 cm/s   SHUNTS MV E/A  ratio:  1.59         Systemic VTI:  0.33 m                             Systemic Diam: 1.97 cm Darryle Decent MD Electronically signed by Darryle Decent MD Signature Date/Time: 05/05/2024/12:28:29 PM    Final    DG Chest 2 View Result Date: 05/04/2024 EXAM: 2 VIEW(S) XRAY OF THE CHEST 05/04/2024 02:11:00 PM COMPARISON: 03/28/2024 CLINICAL HISTORY: SOB FINDINGS: LINES, TUBES AND DEVICES: Left atrial appendage occlusion device noted. LUNGS AND PLEURA: Bibasilar interstitial and airspace opacities. Mild pulmonary edema. Small bilateral pleural effusions. No pneumothorax. HEART AND MEDIASTINUM: Mild cardiomegaly. Aortic atherosclerosis. BONES AND SOFT TISSUES: Multilevel thoracic osteophytosis. Chronic right posterior rib fracture. IMPRESSION: 1. Bibasilar interstitial and airspace opacities with mild pulmonary edema and small bilateral pleural effusions. 2. Mild cardiomegaly. Electronically signed by: Waddell Calk MD MD 05/04/2024 03:42 PM EST RP Workstation: [id number]   Scheduled Meds:  arformoterol   15 mcg Nebulization BID   And   umeclidinium bromide   1 puff Inhalation Daily   enoxaparin  (LOVENOX ) injection  40 mg Subcutaneous Q24H   furosemide   40 mg Intravenous Daily   irbesartan   150 mg Oral Daily   Continuous Infusions:   LOS: 1 day   Alejandro Marker, DO [facility] Available via Epic secure chat 7am-7pm After these hours, please refer to coverage provider listed on [email or web address] 05/05/2024, 10:09 PM  "

## 2024-05-05 NOTE — Hospital Course (Signed)
 Bianca Wolf is a 84 y.o. female with medical history significant for paroxysmal A-fib s/p ablation and Watchman procedure  not on OAC, HTN, COPD and GERD who presented to the ED for evaluation of shortness of breath.  Patient reports she was seen in the hospital for COPD exacerbation over a month ago and discharged home on oxygen .  She started feeling better so she sent the oxygen  back.  Over the last 2 weeks, she has had gradual increase in shortness of breath with progressive dyspnea on exertion and woke up on the morning of 05/04/2024 with significant shortness of breath so decided come to the ED for further evaluation.  She is noted to be bradycardic in the ED and had to be placed on 3 L supplemental oxygen .  Initial labs were significant forr K+ 3.3, proBNP 3000, troponin 22-23, Hgb 10.1, negative flu/RSV/COVID test. EKG shows junctional bradycardia. CXR shows bibasilar interstitial and airspace opacities with mild pulmonary edema. Pt received IV Lasix  40 mg x 1, IV hydralazine  10 mg x 1 and KCl 40 mEq x 1. TRH excepted this patient for admission.    **Interim History: Echocardiogram has been ordered and performed and she is being continued on diuresis with daily Lasix .  Remains a little bradycardic we will continue to monitor.  She will need an ambulatory home O2 screen prior to discharge as well as evaluation by PT and OT.  Assessment and Plan:  Acute Respiratory Failure with Hypoxia:  Patient previously on 2 L [place] now presenting with progressive shortness of breath found to have new O2 requirement of up to 3 L [place]. This is most likley in the setting of CHF exacerbation. Continue supplemental O2, wean as able.  Repeat chest x-ray in a.m. and she will need an amatory home O2 screen prior to discharge.  Nebs and bronchodilators as below   Acute on chronic combined systolic and diastolic HF: Last TTE on 05/2022 shows EF improved to 65-60%, moderate LVH, G2DD, moderately elevated PASP and severely dilated  LA -Pt presented with progressive shortness of breath, dyspnea on exertion  and lower extremity edema and an elevated proBNP of 3051.0 -Pt with clinical, radiological and laboratory signs of CHF exacerbation -Acute CHF likely 2/2 underdiuresing as patient has not been taking her prescribed as needed Lasix  versus worsening hypertension -C/w IV lasix  40 mg daily - Continue olmesartan (Irbesartan  as substitute) - Follow up repeat echocardiogram continues to show recovered EF but showing G2DD, no RWMA , Moderate Concentric LV. -CTM Strict I&O, daily weights -Maintain K+ > 4.0, Mag > 2.0 -Telemetry   Paroxysmal A-fib: Status post ablation and Watchman procedure in 2024.  Currently in junctional rhythm, rate in the 40s to 50s. Discontinued Amiodarone  200 mg po Daily and Metoprolol . CTM on Telemetry  Bradycardia: Patient with persistent bradycardia with HR in the 40s and 50s since admission. She denies any dizziness, lightheadedness or recent syncope, troponin flat. - Patient currently on Toprol  XL 25 mg daily, likely the cause of her bradycardia and will continue to hold. TSH was 3.360. TTE showed LVEF of 60-65% and no Regional Wall Motion Abnormalities, Moderate Concentric LVH and G2DD. CTM on Telemetry. May need Cardiology consultation if no further improvement after holding beta-blocker for at least 24 hours.    Hypertensive Urgency: BP significantly elevated to SBP in the 170s to 190s on admission.  Continue olmesartan (Irbesartan  150 mg po Daily as substitute). Hold BB. C/w IV Hydralazine  10 mg q6hprn SBP >180 or DBP >120. CTM  BP per Protocol. Last BP reading was 168/58   COPD: No wheezing or increased productive cough to indicate an acute exacerbation. Resume home bronchodilator with Brovana  plus Incruse Ellipta  and as needed DuoNebs  Normocytic Anemia: Hgb/Hct Trend:  Recent Labs  Lab 05/04/24 1512 05/05/24 0237  HGB 10.1* 9.7*  HCT 33.0* 31.1*  MCV 97.1 97.5  -Check Anemia Panel in the  AM. CTM for S/Sx of Bleeding; No overt bleeding noted. Repeat CBC in the AM   Hypokalemia: K+ improved and went from 3.3 ->3.6. CTM & Replete as Necessary. Repeat CMP in hte AM and order Mag Level   Overweight: Complicates overall prognosis and care. Estimated body mass index is 26.44 kg/m as calculated from the following:   Height as of this encounter: 5' 3 (1.6 m).   Weight as of this encounter: 67.7 kg. Weight Loss and Dietary Counseling given

## 2024-05-06 ENCOUNTER — Inpatient Hospital Stay

## 2024-05-06 ENCOUNTER — Other Ambulatory Visit (HOSPITAL_COMMUNITY): Payer: Self-pay

## 2024-05-06 ENCOUNTER — Telehealth: Payer: Self-pay | Admitting: Physician Assistant

## 2024-05-06 ENCOUNTER — Inpatient Hospital Stay (HOSPITAL_COMMUNITY)

## 2024-05-06 DIAGNOSIS — Z95818 Presence of other cardiac implants and grafts: Secondary | ICD-10-CM

## 2024-05-06 DIAGNOSIS — J9601 Acute respiratory failure with hypoxia: Secondary | ICD-10-CM | POA: Diagnosis not present

## 2024-05-06 DIAGNOSIS — R001 Bradycardia, unspecified: Secondary | ICD-10-CM

## 2024-05-06 LAB — CBC WITH DIFFERENTIAL/PLATELET
Abs Immature Granulocytes: 0.02 K/uL (ref 0.00–0.07)
Basophils Absolute: 0 K/uL (ref 0.0–0.1)
Basophils Relative: 0 %
Eosinophils Absolute: 0.1 K/uL (ref 0.0–0.5)
Eosinophils Relative: 1 %
HCT: 32.5 % — ABNORMAL LOW (ref 36.0–46.0)
Hemoglobin: 10.5 g/dL — ABNORMAL LOW (ref 12.0–15.0)
Immature Granulocytes: 0 %
Lymphocytes Relative: 12 %
Lymphs Abs: 1.1 K/uL (ref 0.7–4.0)
MCH: 30.4 pg (ref 26.0–34.0)
MCHC: 32.3 g/dL (ref 30.0–36.0)
MCV: 94.2 fL (ref 80.0–100.0)
Monocytes Absolute: 0.7 K/uL (ref 0.1–1.0)
Monocytes Relative: 8 %
Neutro Abs: 6.9 K/uL (ref 1.7–7.7)
Neutrophils Relative %: 79 %
Platelets: 202 K/uL (ref 150–400)
RBC: 3.45 MIL/uL — ABNORMAL LOW (ref 3.87–5.11)
RDW: 14.9 % (ref 11.5–15.5)
WBC: 8.7 K/uL (ref 4.0–10.5)
nRBC: 0 % (ref 0.0–0.2)

## 2024-05-06 LAB — VITAMIN B12: Vitamin B-12: 229 pg/mL (ref 180–914)

## 2024-05-06 LAB — COMPREHENSIVE METABOLIC PANEL WITH GFR
ALT: 9 U/L (ref 0–44)
AST: 16 U/L (ref 15–41)
Albumin: 3.8 g/dL (ref 3.5–5.0)
Alkaline Phosphatase: 81 U/L (ref 38–126)
Anion gap: 11 (ref 5–15)
BUN: 12 mg/dL (ref 8–23)
CO2: 32 mmol/L (ref 22–32)
Calcium: 9.1 mg/dL (ref 8.9–10.3)
Chloride: 100 mmol/L (ref 98–111)
Creatinine, Ser: 1.01 mg/dL — ABNORMAL HIGH (ref 0.44–1.00)
GFR, Estimated: 55 mL/min — ABNORMAL LOW
Glucose, Bld: 88 mg/dL (ref 70–99)
Potassium: 3.8 mmol/L (ref 3.5–5.1)
Sodium: 142 mmol/L (ref 135–145)
Total Bilirubin: 1.1 mg/dL (ref 0.0–1.2)
Total Protein: 7.1 g/dL (ref 6.5–8.1)

## 2024-05-06 LAB — IRON AND TIBC
Iron: 48 ug/dL (ref 28–170)
Saturation Ratios: 18 % (ref 10.4–31.8)
TIBC: 267 ug/dL (ref 250–450)
UIBC: 219 ug/dL

## 2024-05-06 LAB — RETICULOCYTES
Immature Retic Fract: 14.9 % (ref 2.3–15.9)
RBC.: 3.46 MIL/uL — ABNORMAL LOW (ref 3.87–5.11)
Retic Count, Absolute: 65.4 K/uL (ref 19.0–186.0)
Retic Ct Pct: 1.9 % (ref 0.4–3.1)

## 2024-05-06 LAB — FERRITIN: Ferritin: 87 ng/mL (ref 11–307)

## 2024-05-06 LAB — MAGNESIUM: Magnesium: 1.7 mg/dL (ref 1.7–2.4)

## 2024-05-06 LAB — FOLATE: Folate: 13.1 ng/mL

## 2024-05-06 LAB — PHOSPHORUS: Phosphorus: 3 mg/dL (ref 2.5–4.6)

## 2024-05-06 MED ORDER — POTASSIUM CHLORIDE CRYS ER 20 MEQ PO TBCR
20.0000 meq | EXTENDED_RELEASE_TABLET | Freq: Once | ORAL | Status: AC
  Administered 2024-05-06: 20 meq via ORAL
  Filled 2024-05-06: qty 1

## 2024-05-06 MED ORDER — FUROSEMIDE 20 MG PO TABS: 20.0000 mg | ORAL_TABLET | Freq: Every day | ORAL | Status: AC

## 2024-05-06 MED ORDER — MAGNESIUM SULFATE 2 GM/50ML IV SOLN
2.0000 g | Freq: Once | INTRAVENOUS | Status: AC
  Administered 2024-05-06: 2 g via INTRAVENOUS
  Filled 2024-05-06: qty 50

## 2024-05-06 NOTE — TOC Transition Note (Addendum)
 Transition of Care [facility]) - Discharge Note   Patient Details  Name: Bianca Wolf MRN: [id number] Date of Birth: 06-03-1940  Transition of Care [facility]) CM/SW Contact:  Waddell Barnie Rama, RN Phone Number: 05/06/2024, 4:41 PM   Clinical Narrative:    For dc today, patient has transport home , [employer] to supply oxygen . Patient states she does not want the OP PT.         Patient Goals and CMS Choice            Discharge Placement                       Discharge Plan and Services Additional resources added to the After Visit Summary for                                       Social Drivers of Health (SDOH) Interventions SDOH Screenings   Food Insecurity: No Food Insecurity (05/06/2024)  Housing: Low Risk (05/06/2024)  Transportation Needs: No Transportation Needs (05/06/2024)  Utilities: Not At Risk (05/06/2024)  Depression (PHQ2-9): Low Risk (03/31/2024)  Social Connections: Unknown (05/06/2024)  Recent Concern: Social Connections - Moderately Isolated (03/29/2024)  Tobacco Use: Medium Risk (05/04/2024)     Readmission Risk Interventions    05/06/2024    4:21 PM  Readmission Risk Prevention Plan  Post Dischage Appt Complete  Medication Screening Complete  Transportation Screening Complete

## 2024-05-06 NOTE — TOC CM/SW Note (Signed)
 Transition of Care [facility]) - Inpatient Brief Assessment   Patient Details  Name: Bianca Wolf MRN: [id number] Date of Birth: Sep 01, 1940  Transition of Care [facility]) CM/SW Contact:    Waddell Barnie Rama, RN Phone Number: 05/06/2024, 4:24 PM   Clinical Narrative: From home with alone,  has PCP and insurance on file, states has no HH services in place at this time or DME at home.  States family member( daughter)  will transport them home at [employer] and family is support system, states gets medications from [place] on [address].  Pta self ambulatory.   She needs oxygen  , she has no preference of the agency,  NCM made referral to [facility].     Transition of Care Asessment: Insurance and Status: Insurance coverage has been reviewed Patient has primary care physician: Yes Home environment has been reviewed: home alone Prior level of function:: indep Prior/Current Home Services: No current home services Social Drivers of Health Review: SDOH reviewed no interventions necessary Readmission risk has been reviewed: Yes Transition of care needs: transition of care needs identified, TOC will continue to follow

## 2024-05-06 NOTE — Progress Notes (Signed)
 SATURATION QUALIFICATIONS: (This note is used to comply with regulatory documentation for home oxygen )  Patient Saturations on Room Air at Rest = 85-88%  Patient Saturations on 2 Liters of oxygen  while at rest= 95%  Patient Saturations on 2 Liters of oxygen  while at Ambulating = 90-95%  Warrick POUR OTR/L  Acute Rehab Services  [phone] office number

## 2024-05-06 NOTE — Discharge Summary (Signed)
 Physician Discharge Summary  Bianca Wolf FMW:[id number] DOB: [birth year] DOA: 05/04/2024  PCP: Clarice Nottingham, MD  Admit date: 05/04/2024 Discharge date: 05/06/2024  Time spent:45 minutes  Recommendations for Outpatient Follow-up:  Follow-up with Dr. 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)   Hypertensive urgency   Bradycardia   Hypokalemia   Discharge Condition: Improved  Diet recommendation: Low-sodium, heart healthy  Filed Weights   05/04/24 1345 05/05/24 1500 05/06/24 0717  Weight: 71.8 kg 67.7 kg 69.8 kg    History of present illness:  84/F, history of paroxysmal A-fib, prior ablation and watchman's device, COPD, GERD presented to the ED with shortness of breath, discharged on home oxygen  over a month ago, presented to the ED 1/12 with worsening dyspnea on exertion, in the ER she was noted to be bradycardic, labs noted proBNP 3000, troponin 22, EKG with junctional bradycardia, chest x-ray with interstitial airspace opacities, mild pulmonary edema. - Admitted, started on diuretics, metoprolol  held - Diuretics continued, persistent bradycardia, amiodarone  discontinued 1/13  Hospital Course:  Acute on chronic systolic and diastolic CHF - Last echo with improved EF 10/24 to 60-65%, moderate LVH, grade 2 DD, moderately elevated PASP - Was on Lasix  as needed prior to admission - Improving with diuresis, cards following, switch to Lasix  20 mg daily, continue olmesartan - Metoprolol  discontinued, see below   Paroxysmal atrial fibrillation Sinus bradycardia -Prior ablation and watchman's device in 2025 - Concern for junctional rhythm on admission, metoprolol  was discontinued - Persistent sinus bradycardia, amiodarone  discontinued yesterday by my  partner - TSH is normal, echo largely unchanged from prior -Appreciate cardiology input, okay for discharge home, they recommended a 14-day monitor   Hypertensive urgency - Improved, meds as above   COPD - Continue home bronchodilators, Brovana , Incruse Ellipta  - Supposed to be on 2 L home O2, following recent discharge, scented back recently - Check ambulatory O2 sats, may need home O2 at DC again   Chronic anemia - Anemia panel with mild iron deficiency, add iron at DC   Hypokalemia Repleted   Overweight  Discharge Exam: Vitals:   05/06/24 1135 05/06/24 1434  BP: (!) 124/54 (!) 151/72  Pulse: (!) 52 (!) 52  Resp: 18 18  Temp: 98 F (36.7 C) 98.1 F (36.7 C)  SpO2: 96%     General exam: Appears calm and comfortable  Respiratory system: Coarse breath sounds, occasional crackles Cardiovascular system: S1 & S2 heard, RRR.  Bradycardic Abd: nondistended, soft and nontender.Normal bowel sounds heard. Central nervous system: Alert and oriented. No focal neurological deficits. Extremities: Trace edema Skin: No rashes Psychiatry:  Mood & affect appropriate.    Discharge Instructions   Discharge Instructions     Increase activity slowly   Complete by: As directed       Allergies as of 05/06/2024   No Known Allergies      Medication List     STOP taking these medications    amiodarone  200 MG tablet Commonly known as: PACERONE    ibuprofen 200 MG tablet Commonly known as: ADVIL   metoprolol  succinate 25 MG 24 hr tablet Commonly known as: TOPROL -XL       TAKE these medications    albuterol  108 (90 Base) MCG/ACT inhaler Commonly known as: VENTOLIN  HFA Inhale 2 puffs into the lungs  every 6 (six) hours as needed for wheezing or shortness of breath.   Cholecalciferol 125 MCG (5000 UT) Tabs Take 5,000 Units by mouth in the morning.   Citracal Maximum 315-6.25 MG-MCG Tabs Generic drug: Calcium Citrate-Vitamin D3 Take 1 tablet by mouth in the morning.    cyanocobalamin  2000 MCG tablet Take 2,000 mcg by mouth in the morning.   furosemide  20 MG tablet Commonly known as: LASIX  Take 1 tablet (20 mg total) by mouth daily. What changed: See the new instructions.   olmesartan 20 MG tablet Commonly known as: BENICAR Take 20 mg by mouth daily.   omeprazole 20 MG capsule Commonly known as: PRILOSEC TAKE 1 CAPSULE BY MOUTH ONCE DAILY ONE-HALF  TO  ONE  HOUR  BEFORE  MORNING  MEAL; Duration: 90   Stiolto Respimat  2.5-2.5 MCG/ACT Aers Generic drug: Tiotropium Bromide-Olodaterol Inhale 2 puffs into the lungs daily.       Allergies[1]  Follow-up Information     Kate Lonni CROME, MD Follow up.   Specialties: Cardiology, Radiology Why: [name] - cardiology appointment on Thursday May 14, 2024 at 11:20 AM (Arrive by 11:00 AM).  Cardiology office will mail you a heart monitor to wear for 14 days. It will come with instructions how to put on, take off, and mail back. Call the monitor company on the box if you have any questions or call Dr. Alvan office if they are unable to help. Contact information: [address] [place] [address] [phone]                  The results of significant diagnostics from this hospitalization (including imaging, microbiology, ancillary and laboratory) are listed below for reference.    Significant Diagnostic Studies: DG CHEST PORT 1 VIEW Result Date: 05/06/2024 EXAM: 1 VIEW(S) XRAY OF THE CHEST 05/06/2024 06:41:00 AM COMPARISON: 05/04/2024 CLINICAL HISTORY: The patient is experiencing shortness of breath (SOB). FINDINGS: LINES, TUBES AND DEVICES: Left atrial appendage occlusion device. LUNGS AND PLEURA: Small bilateral pleural effusions and bibasilar atelectasis, decreased compared to prior. Chronic interstitial markings. Mild pulmonary edema. No pneumothorax. HEART AND MEDIASTINUM: Cardiomegaly. Aortic atherosclerosis. BONES AND SOFT TISSUES: Chronic right rib fracture.  IMPRESSION: 1. Mild pulmonary edema. 2. Small bilateral pleural effusions and bibasilar atelectasis, decreased compared to prior. 3. Chronic interstitial coarsening, similar . 4. Cardiomegaly. Electronically signed by: Waddell Calk MD 05/06/2024 07:48 AM EST RP Workstation: [id number]   ECHOCARDIOGRAM COMPLETE Result Date: 05/05/2024    ECHOCARDIOGRAM REPORT   Patient Name:   Bianca Wolf Date of Exam: 05/05/2024 Medical Rec #:  [id number]      Height:       63.0 in Accession #:    [id number]     Weight:       158.3 lb Date of Birth:  31-Mar-1941      BSA:          1.751 m Patient Age:    83 years       BP:           172/71 mmHg Patient Gender: F              HR:           52 bpm. Exam Location:  Inpatient Procedure: 2D Echo, Cardiac Doppler and Color Doppler (Both Spectral and Color            Flow Doppler were utilized during procedure). Indications:    Dyspnea  History:  Patient has prior history of Echocardiogram examinations, most                 recent 02/07/2022. COPD, Arrythmias:Atrial Fibrillation; Risk                 Factors:Dyslipidemia and Hypertension.  Sonographer:    Carmelita Hartshorn RDCS, FE, PE Referring Phys: [phone] CLARETTA HERO AMPONSAH IMPRESSIONS  1. Left ventricular ejection fraction, by estimation, is 60 to 65%. The left ventricle has normal function. The left ventricle has no regional wall motion abnormalities. There is moderate concentric left ventricular hypertrophy. Left ventricular diastolic parameters are consistent with Grade II diastolic dysfunction (pseudonormalization).  2. Right ventricular systolic function is normal. The right ventricular size is normal. There is mildly elevated pulmonary artery systolic pressure. The estimated right ventricular systolic pressure is 40.7 mmHg.  3. Left atrial size was moderately dilated.  4. The mitral valve is grossly normal. Mild mitral valve regurgitation. No evidence of mitral stenosis.  5. The aortic valve is grossly normal. Aortic valve  regurgitation is not visualized. Aortic valve sclerosis is present, with no evidence of aortic valve stenosis.  6. The inferior vena cava is dilated in size with >50% respiratory variability, suggesting right atrial pressure of 8 mmHg. Comparison(s): Changes from prior study are noted. The left ventricular function has improved. FINDINGS  Left Ventricle: Left ventricular ejection fraction, by estimation, is 60 to 65%. The left ventricle has normal function. The left ventricle has no regional wall motion abnormalities. The left ventricular internal cavity size was normal in size. There is  moderate concentric left ventricular hypertrophy. Left ventricular diastolic parameters are consistent with Grade II diastolic dysfunction (pseudonormalization). Right Ventricle: The right ventricular size is normal. No increase in right ventricular wall thickness. Right ventricular systolic function is normal. There is mildly elevated pulmonary artery systolic pressure. The tricuspid regurgitant velocity is 2.86  m/s, and with an assumed right atrial pressure of 8 mmHg, the estimated right ventricular systolic pressure is 40.7 mmHg. Left Atrium: Left atrial size was moderately dilated. Right Atrium: Right atrial size was normal in size. Pericardium: There is no evidence of pericardial effusion. Mitral Valve: The mitral valve is grossly normal. Mild mitral valve regurgitation. No evidence of mitral valve stenosis. Tricuspid Valve: The tricuspid valve is grossly normal. Tricuspid valve regurgitation is mild . No evidence of tricuspid stenosis. Aortic Valve: The aortic valve is grossly normal. Aortic valve regurgitation is not visualized. Aortic valve sclerosis is present, with no evidence of aortic valve stenosis. Aortic valve mean gradient measures 9.0 mmHg. Aortic valve peak gradient measures 17.1 mmHg. Aortic valve area, by VTI measures 2.10 cm. Pulmonic Valve: The pulmonic valve was grossly normal. Pulmonic valve regurgitation  is not visualized. No evidence of pulmonic stenosis. Aorta: The aortic root is normal in size and structure. Venous: The inferior vena cava is dilated in size with greater than 50% respiratory variability, suggesting right atrial pressure of 8 mmHg. IAS/Shunts: The atrial septum is grossly normal.  LEFT VENTRICLE PLAX 2D LVIDd:         3.70 cm   Diastology LVIDs:         2.50 cm   LV e' medial:    6.53 cm/s LV PW:         1.30 cm   LV E/e' medial:  16.5 LV IVS:        1.40 cm   LV e' lateral:   10.60 cm/s LVOT diam:     1.97 cm  LV E/e' lateral: 10.2 LV SV:         102 LV SV Index:   58 LVOT Area:     3.05 cm  RIGHT VENTRICLE             IVC RV Basal diam:  3.37 cm     IVC diam: 2.14 cm RV Mid diam:    2.72 cm RV S prime:     14.40 cm/s TAPSE (M-mode): 1.6 cm LEFT ATRIUM             Index        RIGHT ATRIUM           Index LA diam:        4.50 cm 2.57 cm/m   RA Area:     17.70 cm LA Vol (A2C):   62.8 ml 35.87 ml/m  RA Volume:   43.80 ml  25.02 ml/m LA Vol (A4C):   75.2 ml 42.95 ml/m LA Biplane Vol: 70.6 ml 40.33 ml/m  AORTIC VALVE AV Area (Vmax):    2.08 cm AV Area (Vmean):   2.07 cm AV Area (VTI):     2.10 cm AV Vmax:           207.00 cm/s AV Vmean:          134.000 cm/s AV VTI:            0.484 m AV Peak Grad:      17.1 mmHg AV Mean Grad:      9.0 mmHg LVOT Vmax:         141.00 cm/s LVOT Vmean:        90.800 cm/s LVOT VTI:          0.334 m LVOT/AV VTI ratio: 0.69  AORTA Ao Root diam: 2.79 cm MITRAL VALVE                TRICUSPID VALVE MV Area (PHT): 2.87 cm     TR Peak grad:   32.7 mmHg MV Decel Time: 264 msec     TR Vmax:        286.00 cm/s MV E velocity: 108.00 cm/s MV A velocity: 68.10 cm/s   SHUNTS MV E/A ratio:  1.59         Systemic VTI:  0.33 m                             Systemic Diam: 1.97 cm Darryle Decent MD Electronically signed by Darryle Decent MD Signature Date/Time: 05/05/2024/12:28:29 PM    Final    DG Chest 2 View Result Date: 05/04/2024 EXAM: 2 VIEW(S) XRAY OF THE CHEST 05/04/2024  02:11:00 PM COMPARISON: 03/28/2024 CLINICAL HISTORY: SOB FINDINGS: LINES, TUBES AND DEVICES: Left atrial appendage occlusion device noted. LUNGS AND PLEURA: Bibasilar interstitial and airspace opacities. Mild pulmonary edema. Small bilateral pleural effusions. No pneumothorax. HEART AND MEDIASTINUM: Mild cardiomegaly. Aortic atherosclerosis. BONES AND SOFT TISSUES: Multilevel thoracic osteophytosis. Chronic right posterior rib fracture. IMPRESSION: 1. Bibasilar interstitial and airspace opacities with mild pulmonary edema and small bilateral pleural effusions. 2. Mild cardiomegaly. Electronically signed by: Waddell Calk MD MD 05/04/2024 03:42 PM EST RP Workstation: [id number]    Microbiology: Recent Results (from the past 240 hours)  Resp panel by RT-PCR (RSV, Flu A&B, Covid) Anterior Nasal Swab     Status: None   Collection Time: 05/04/24  1:43 PM   Specimen: Anterior Nasal Swab  Result Value Ref Range Status  SARS Coronavirus 2 by RT PCR NEGATIVE NEGATIVE Final   Influenza A by PCR NEGATIVE NEGATIVE Final   Influenza B by PCR NEGATIVE NEGATIVE Final    Comment: (NOTE) The Xpert Xpress SARS-CoV-2/FLU/RSV plus assay is intended as an aid in the diagnosis of influenza from Nasopharyngeal swab specimens and should not be used as a sole basis for treatment. Nasal washings and aspirates are unacceptable for Xpert Xpress SARS-CoV-2/FLU/RSV testing.  Fact Sheet for Patients: bloggercourse.com  Fact Sheet for Healthcare Providers: seriousbroker.it  This test is not yet approved or cleared by the [place]  FDA and has been authorized for detection and/or diagnosis of SARS-CoV-2 by FDA under an Emergency Use Authorization (EUA). This EUA will remain in effect (meaning this test can be used) for the duration of the COVID-19 declaration under Section 564(b)(1) of the Act, 21 U.S.C. section 360bbb-3(b)(1), unless the authorization is terminated  or revoked.     Resp Syncytial Virus by PCR NEGATIVE NEGATIVE Final    Comment: (NOTE) Fact Sheet for Patients: bloggercourse.com  Fact Sheet for Healthcare Providers: seriousbroker.it  This test is not yet approved or cleared by the [place]  FDA and has been authorized for detection and/or diagnosis of SARS-CoV-2 by FDA under an Emergency Use Authorization (EUA). This EUA will remain in effect (meaning this test can be used) for the duration of the COVID-19 declaration under Section 564(b)(1) of the Act, 21 U.S.C. section 360bbb-3(b)(1), unless the authorization is terminated or revoked.  Performed at [facility], [address] [place], [place] [address]      Labs: Basic Metabolic Panel: Recent Labs  Lab 05/04/24 1512 05/05/24 0237 05/06/24 0318  NA 142 143 142  K 3.3* 3.6 3.8  CL 103 103 100  CO2 28 31 32  GLUCOSE 90 91 88  BUN 8 8 12   CREATININE 0.92 0.91 1.01*  CALCIUM 8.8* 8.6* 9.1  MG  --   --  1.7  PHOS  --   --  3.0   Liver Function Tests: Recent Labs  Lab 05/04/24 1512 05/06/24 0318  AST 16 16  ALT 9 9  ALKPHOS 86 81  BILITOT 0.9 1.1  PROT 7.1 7.1  ALBUMIN 3.8 3.8   No results for input(s): LIPASE, AMYLASE in the last 168 hours. No results for input(s): AMMONIA in the last 168 hours. CBC: Recent Labs  Lab 05/04/24 1512 05/05/24 0237 05/06/24 0318  WBC 6.5 7.5 8.7  NEUTROABS 5.2  --  6.9  HGB 10.1* 9.7* 10.5*  HCT 33.0* 31.1* 32.5*  MCV 97.1 97.5 94.2  PLT 222 204 202   Cardiac Enzymes: No results for input(s): CKTOTAL, CKMB, CKMBINDEX, TROPONINI in the last 168 hours. BNP: BNP (last 3 results) No results for input(s): BNP in the last 8760 hours.  ProBNP (last 3 results) Recent Labs    05/04/24 1704  PROBNP 3,051.0*    CBG: No results for input(s): GLUCAP in the last 168 hours.     Signed:  Sigurd Pac MD.  [facility] 05/06/2024, 3:45 PM       [1] No Known Allergies

## 2024-05-06 NOTE — Progress Notes (Signed)
 SATURATION QUALIFICATIONS: (This note is used to comply with regulatory documentation for home oxygen )   Patient Saturations on Room Air at Rest = 85%   Patient Saturations on 2 Liters of oxygen  while at rest= 95%   Patient Saturations on 2 Liters of oxygen  while at Ambulating = 95%

## 2024-05-06 NOTE — Evaluation (Signed)
 Physical Therapy Evaluation Patient Details Name: Bianca Wolf MRN: [id number] DOB: 02-Apr-1941 Today's Date: 05/06/2024  History of Present Illness  Pt is a 84 y.o. female who presented 05/04/24 due to SOB. Pt was admitted due to acute hypoxic respiatory faluire in the setting of CHF.  PMH paroxysmal A-fib s/p ablation and Watchman procedure, osteoporosis, HTN, GERD, COPD   Clinical Impression  Prior to admittance, pt is independent with mobility and ADLs. Pt presents to evaluation with deficits in mobility, activity tolerance, and balance. Pt was able to ambulate without AD and no physical assistance but does demonstrate impaired balance, displaying occasional lateral LOB, reaching for hallway railings for improved stability. Therapist encouraged pt to use SPC at home for improved stability. PT will continue to treat pt while she is admitted. Recommending OPPT at discharge to address remaining mobility deficits and reduce risk of falls.         If plan is discharge home, recommend the following: Assist for transportation   Can travel by private vehicle        Equipment Recommendations None recommended by PT  Recommendations for Other Services       Functional Status Assessment Patient has had a recent decline in their functional status and demonstrates the ability to make significant improvements in function in a reasonable and predictable amount of time.     Precautions / Restrictions Precautions Precautions: Fall Recall of Precautions/Restrictions: Intact Restrictions Weight Bearing Restrictions Per Provider Order: No      Mobility  Bed Mobility Overal bed mobility: Modified Independent             General bed mobility comments: pt received sitting EOB and returned to sitting EOB    Transfers Overall transfer level: Needs assistance Equipment used: None Transfers: Sit to/from Stand Sit to Stand: Supervision           General transfer comment: Pt completed  STS from EOB, pushing up from surface with BUE and then requiring increased time to stabilize once standing.    Ambulation/Gait Ambulation/Gait assistance: Contact guard assist Gait Distance (Feet): 200 Feet Assistive device: None Gait Pattern/deviations: Step-through pattern, Decreased stride length, Drifts right/left, Narrow base of support Gait velocity: reduced Gait velocity interpretation: <1.8 ft/sec, indicate of risk for recurrent falls   General Gait Details: Pt demonstrating reciprocal gait pattern with reduced step width, drifts R/L, and reaches for hallway railings for improved stability. Pt was encouraged to use SPC at home for improved stability.  Stairs            Wheelchair Mobility     Tilt Bed    Modified Rankin (Stroke Patients Only)       Balance Overall balance assessment: Needs assistance Sitting-balance support: No upper extremity supported, Feet supported Sitting balance-Leahy Scale: Good Sitting balance - Comments: seated EOB with no LOB   Standing balance support: No upper extremity supported, During functional activity Standing balance-Leahy Scale: Fair Standing balance comment: pt able to shift weight throughout gait and stand without UE support, but is reaching for hallway railings for improved stability with several mild lateral LOBs; does not require physical assistance for maintaining upright.                             Pertinent Vitals/Pain Pain Assessment Pain Assessment: No/denies pain    Home Living Family/patient expects to be discharged to:: Other (Comment) (condo)  Additional Comments: family can help PRN. tub-shower, tub-bench, elevated toilet, indep with mobility, indep with ADLs    Prior Function Prior Level of Function : Independent/Modified Independent             Mobility Comments: indep with mobility, drives ADLs Comments: indep with ADLs     Extremity/Trunk Assessment    Upper Extremity Assessment Upper Extremity Assessment: Defer to OT evaluation    Lower Extremity Assessment Lower Extremity Assessment: Generalized weakness;LLE deficits/detail LLE Deficits / Details: hip flexion 4-/5, knee flexion and extension 4/5       Communication   Communication Communication: No apparent difficulties    Cognition Arousal: Alert Behavior During Therapy: WFL for tasks assessed/performed   PT - Cognitive impairments: No apparent impairments                         Following commands: Intact       Cueing Cueing Techniques: Verbal cues, Gestural cues     General Comments General comments (skin integrity, edema, etc.): RA 96%, dropped to 86% requiring 2L to maintain above 90% while ambulating. BP: 139/57, HR: 79 bpm    Exercises     Assessment/Plan    PT Assessment Patient needs continued PT services  PT Problem List Decreased strength;Decreased activity tolerance;Decreased balance;Decreased mobility;Decreased knowledge of use of DME       PT Treatment Interventions DME instruction;Gait training;Functional mobility training;Therapeutic activities;Therapeutic exercise;Balance training;Patient/family education;Manual techniques;Modalities    PT Goals (Current goals can be found in the Care Plan section)  Acute Rehab PT Goals Patient Stated Goal: to go home PT Goal Formulation: With patient Time For Goal Achievement: 05/20/24 Potential to Achieve Goals: Good Additional Goals Additional Goal #1: pt will score > 19 on DGI demonstrating improved balance and reduced risk of falls    Frequency Min 1X/week     Co-evaluation               AM-PAC PT 6 Clicks Mobility  Outcome Measure Help needed turning from your back to your side while in a flat bed without using bedrails?: None Help needed moving from lying on your back to sitting on the side of a flat bed without using bedrails?: None Help needed moving to and from a bed to a  chair (including a wheelchair)?: A Little Help needed standing up from a chair using your arms (e.g., wheelchair or bedside chair)?: A Little Help needed to walk in hospital room?: A Little Help needed climbing 3-5 steps with a railing? : Total 6 Click Score: 18    End of Session Equipment Utilized During Treatment: Gait belt;Oxygen  Activity Tolerance: Patient tolerated treatment well Patient left: in bed;with call bell/phone within reach Nurse Communication: Mobility status PT Visit Diagnosis: Unsteadiness on feet (R26.81);Other abnormalities of gait and mobility (R26.89);Muscle weakness (generalized) (M62.81)    Time: 9193-9170 PT Time Calculation (min) (ACUTE ONLY): 23 min   Charges:   PT Evaluation $PT Eval Low Complexity: 1 Low   PT General Charges $$ ACUTE PT VISIT: 1 Visit         Leontine Hilt DPT Acute Rehab Services [phone] Prefer contact via chat   Leontine NOVAK Ivah Girardot 05/06/2024, 9:22 AM

## 2024-05-06 NOTE — Progress Notes (Unsigned)
Enrolled patient for a 14 day Zio XT monitor to be mailed to patients home  Bianca Wolf to read

## 2024-05-06 NOTE — Progress Notes (Signed)
 Reviewed AVS, patient expressed understanding of medications, MD follow up reviewed.   Patient states all belongings brought to the hospital at time of admission are accounted for and packed to take home.  Patient waiting on DME and daughter to come soon to transport home.  Primary nurse aware.

## 2024-05-06 NOTE — Progress Notes (Signed)
 Heart Failure Navigator Progress Note  Assessed for Heart & Vascular TOC clinic readiness.  Patient does not meet criteria due to EF 60-65%, has a scheduled [facility] appointment on 05/14/2024. No HF TOC. .   Navigator will sign off at this time.   Stephane Haddock, BSN, Scientist, Clinical (histocompatibility And Immunogenetics) Only

## 2024-05-06 NOTE — Evaluation (Signed)
 Occupational Therapy Evaluation Patient Details Name: Bianca Wolf MRN: [id number] DOB: Sep 13, 1940 Today's Date: 05/06/2024   History of Present Illness   Pt is a 84 y.o. female who presented 05/04/24 due to SOB. Pt was admitted due to acute hypoxic respiatory faluire in the setting of CHF.  PMH paroxysmal A-fib s/p ablation and Watchman procedure, osteoporosis, HTN, GERD, COPD     Clinical Impressions Pt reported at PLOF they live alone and did not use any DME. Pt at this time used 4WW in session and was able to ambulate with distant supervision as just cueing for pacing. Pt was mod I to supervision for ADLS. Recommendation for OP PT once discharge from hospital with Acute Occupational Therapy to follow.      If plan is discharge home, recommend the following:   Assistance with cooking/housework     Functional Status Assessment   Patient has had a recent decline in their functional status and demonstrates the ability to make significant improvements in function in a reasonable and predictable amount of time.     Equipment Recommendations    [phone])     Recommendations for Other Services         Precautions/Restrictions   Precautions Precautions: Fall Recall of Precautions/Restrictions: Intact Precaution/Restrictions Comments: watch o2 Restrictions Weight Bearing Restrictions Per Provider Order: No     Mobility Bed Mobility               General bed mobility comments: pt presented atr EOB    Transfers Overall transfer level: Needs assistance   Transfers: Sit to/from Stand Sit to Stand: Supervision                  Balance Overall balance assessment: Needs assistance Sitting-balance support: Feet supported Sitting balance-Leahy Scale: Good     Standing balance support: No upper extremity supported, Bilateral upper extremity supported Standing balance-Leahy Scale: Fair Standing balance comment: does beter with [id number]                            ADL either performed or assessed with clinical judgement   ADL Overall ADL's : Needs assistance/impaired Eating/Feeding: Independent;Sitting   Grooming: Wash/dry hands;Wash/dry face;Modified independent;Supervision/safety;Sitting;Standing   Upper Body Bathing: Supervision/ safety;Sitting   Lower Body Bathing: Supervison/ safety;Sit to/from stand   Upper Body Dressing : Supervision/safety;Sitting   Lower Body Dressing: Supervision/safety;Sit to/from stand   Toilet Transfer: Supervision/safety   Toileting- Architect and Hygiene: Supervision/safety;Sit to/from stand       Functional mobility during ADLs: Supervision/safety;Rolling walker (2 wheels);Cueing for safety;Cueing for sequencing       Vision Baseline Vision/History: 0 No visual deficits Ability to See in Adequate Light: 0 Adequate Patient Visual Report: No change from baseline       Perception         Praxis         Pertinent Vitals/Pain Pain Assessment Pain Assessment: No/denies pain     Extremity/Trunk Assessment Upper Extremity Assessment Upper Extremity Assessment: Overall WFL for tasks assessed   Lower Extremity Assessment Lower Extremity Assessment: Defer to PT evaluation   Cervical / Trunk Assessment Cervical / Trunk Assessment: Kyphotic   Communication Communication Communication: No apparent difficulties   Cognition Arousal: Alert Behavior During Therapy: WFL for tasks assessed/performed Cognition: No apparent impairments  Following commands: Intact       Cueing  General Comments   Cueing Techniques: Verbal cues;Gestural cues      Exercises     Shoulder Instructions      Home Living Family/patient expects to be discharged to:: Private residence [facility]) Living Arrangements: Alone Available Help at Discharge: Family Type of Home: Apartment       Home Layout: One level     Bathroom Shower/Tub:  Chief Strategy Officer: Standard     Home Equipment: Cane - single point          Prior Functioning/Environment Prior Level of Function : Independent/Modified Independent             Mobility Comments: indep with mobility, drives ADLs Comments: indep with ADLs    OT Problem List: Decreased strength;Decreased activity tolerance;Impaired balance (sitting and/or standing);Decreased safety awareness;Decreased knowledge of use of DME or AE   OT Treatment/Interventions: Self-care/ADL training;Therapeutic exercise;DME and/or AE instruction;Therapeutic activities;Patient/family education;Balance training      OT Goals(Current goals can be found in the care plan section)   Acute Rehab OT Goals Patient Stated Goal: to go home OT Goal Formulation: With patient Time For Goal Achievement: 05/20/24 Potential to Achieve Goals: Fair   OT Frequency:  Min 2X/week    Co-evaluation              AM-PAC OT 6 Clicks Daily Activity     Outcome Measure Help from another person eating meals?: None Help from another person taking care of personal grooming?: None Help from another person toileting, which includes using toliet, bedpan, or urinal?: None Help from another person bathing (including washing, rinsing, drying)?: A Little Help from another person to put on and taking off regular upper body clothing?: None Help from another person to put on and taking off regular lower body clothing?: A Little 6 Click Score: 22   End of Session Equipment Utilized During Treatment: Gait belt;Rolling walker (2 wheels) Nurse Communication: Mobility status  Activity Tolerance: Patient tolerated treatment well Patient left: in bed;with call bell/phone within reach  OT Visit Diagnosis: Unsteadiness on feet (R26.81);Other abnormalities of gait and mobility (R26.89);Muscle weakness (generalized) (M62.81)                Time: 8793-8772 OT Time Calculation (min): 21 min Charges:  OT  General Charges $OT Visit: 1 Visit OT Evaluation $OT Eval Low Complexity: 1 Low  Warrick POUR OTR/L  Acute Rehab Services  [phone] office number   Warrick Berber 05/06/2024, 12:39 PM
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
## 2024-05-06 NOTE — Progress Notes (Signed)
 " PROGRESS NOTE    Bianca Wolf  FMW:[id number] DOB: 05-Nov-1940 DOA: 05/04/2024 PCP: Clarice Nottingham, MD  83/F, history of paroxysmal A-fib, prior ablation and watchman's device, COPD, GERD presented to the ED with shortness of breath, discharged on home oxygen  over a month ago, presented to the ED 1/12 with worsening dyspnea on exertion, in the ER she was noted to be bradycardic, labs noted proBNP 3000, troponin 22, EKG with junctional bradycardia, chest x-ray with interstitial airspace opacities, mild pulmonary edema. - Admitted, started on diuretics, metoprolol  held - Diuretics continued, persistent bradycardia, amiodarone  discontinued 1/13  Subjective: Feels better, wants to go home, heart rate 40-50 range Dr.Sheikh's note reviewed   Assessment and Plan:  Acute on chronic systolic and diastolic CHF - Last echo with improved EF 10/24 to 60-65%, moderate LVH, grade 2 DD, moderately elevated PASP - Questionable compliance, rhythm changes could be contributing as well - Improving with diuresis, continue IV Lasix  1 more day, continue ARB - Will request cards eval with bradycardia  Paroxysmal atrial fibrillation Sinus bradycardia -Prior ablation and watchman's device in 2025 - Junctional rhythm on admission, metoprolol  was discontinued - Persistent sinus bradycardia, amiodarone  discontinued yesterday by my partner - Will request cards input, - TSH is normal, echo largely unchanged from prior  Hypertensive urgency - Improved, meds as above  COPD - Continue home bronchodilators, Brovana , Incruse Ellipta  - Supposed to be on 2 L home O2, following recent discharge, scented back recently - Check ambulatory O2 sats, may need home O2 at DC again  Chronic anemia - Anemia panel with mild iron deficiency, add iron at DC  Hypokalemia Repleted  Overweight  DVT prophylaxis: Code Status:  Family Communication: Disposition Plan:   Consultants:    Procedures:   Antimicrobials:     Objective: Vitals:   05/06/24 0424 05/06/24 0717 05/06/24 0756 05/06/24 0758  BP:      Pulse:      Resp:      Temp:      TempSrc:      SpO2: 95%  96% 96%  Weight:  69.8 kg    Height:        Intake/Output Summary (Last 24 hours) at 05/06/2024 1302 Last data filed at 05/06/2024 0600 Gross per 24 hour  Intake 60 ml  Output 800 ml  Net -740 ml   Filed Weights   05/04/24 1345 05/05/24 1500 05/06/24 0717  Weight: 71.8 kg 67.7 kg 69.8 kg    Examination:  General exam: Appears calm and comfortable  Respiratory system: Coarse breath sounds, occasional crackles Cardiovascular system: S1 & S2 heard, RRR.  Bradycardic Abd: nondistended, soft and nontender.Normal bowel sounds heard. Central nervous system: Alert and oriented. No focal neurological deficits. Extremities: Trace edema Skin: No rashes Psychiatry:  Mood & affect appropriate.     Data Reviewed:   CBC: Recent Labs  Lab 05/04/24 1512 05/05/24 0237 05/06/24 0318  WBC 6.5 7.5 8.7  NEUTROABS 5.2  --  6.9  HGB 10.1* 9.7* 10.5*  HCT 33.0* 31.1* 32.5*  MCV 97.1 97.5 94.2  PLT 222 204 202   Basic Metabolic Panel: Recent Labs  Lab 05/04/24 1512 05/05/24 0237 05/06/24 0318  NA 142 143 142  K 3.3* 3.6 3.8  CL 103 103 100  CO2 28 31 32  GLUCOSE 90 91 88  BUN 8 8 12   CREATININE 0.92 0.91 1.01*  CALCIUM 8.8* 8.6* 9.1  MG  --   --  1.7  PHOS  --   --  3.0   GFR: Estimated Creatinine Clearance: 38.9 mL/min (A) (by C-G formula based on SCr of 1.01 mg/dL (H)). Liver Function Tests: Recent Labs  Lab 05/04/24 1512 05/06/24 0318  AST 16 16  ALT 9 9  ALKPHOS 86 81  BILITOT 0.9 1.1  PROT 7.1 7.1  ALBUMIN 3.8 3.8   No results for input(s): LIPASE, AMYLASE in the last 168 hours. No results for input(s): AMMONIA in the last 168 hours. Coagulation Profile: No results for input(s): INR, PROTIME in the last 168 hours. Cardiac Enzymes: No results for input(s): CKTOTAL, CKMB, CKMBINDEX,  TROPONINI in the last 168 hours. BNP (last 3 results) Recent Labs    05/04/24 1704  PROBNP 3,051.0*   HbA1C: No results for input(s): HGBA1C in the last 72 hours. CBG: No results for input(s): GLUCAP in the last 168 hours. Lipid Profile: No results for input(s): CHOL, HDL, LDLCALC, TRIG, CHOLHDL, LDLDIRECT in the last 72 hours. Thyroid  Function Tests: Recent Labs    05/05/24 0237  TSH 3.360   Anemia Panel: Recent Labs    05/06/24 0318  VITAMINB12 229  FOLATE 13.1  FERRITIN 87  TIBC 267  IRON 48  RETICCTPCT 1.9   Urine analysis:    Component Value Date/Time   APPEARANCEUR Clear 10/15/2022 1044   GLUCOSEU Negative 10/15/2022 1044   BILIRUBINUR Negative 10/15/2022 1044   PROTEINUR 1+ (A) 10/15/2022 1044   NITRITE Negative 10/15/2022 1044   LEUKOCYTESUR Negative 10/15/2022 1044   Sepsis Labs: @LABRCNTIP (procalcitonin:4,lacticidven:4)  ) Recent Results (from the past 240 hours)  Resp panel by RT-PCR (RSV, Flu A&B, Covid) Anterior Nasal Swab     Status: None   Collection Time: 05/04/24  1:43 PM   Specimen: Anterior Nasal Swab  Result Value Ref Range Status   SARS Coronavirus 2 by RT PCR NEGATIVE NEGATIVE Final   Influenza A by PCR NEGATIVE NEGATIVE Final   Influenza B by PCR NEGATIVE NEGATIVE Final    Comment: (NOTE) The Xpert Xpress SARS-CoV-2/FLU/RSV plus assay is intended as an aid in the diagnosis of influenza from Nasopharyngeal swab specimens and should not be used as a sole basis for treatment. Nasal washings and aspirates are unacceptable for Xpert Xpress SARS-CoV-2/FLU/RSV testing.  Fact Sheet for Patients: bloggercourse.com  Fact Sheet for Healthcare Providers: seriousbroker.it  This test is not yet approved or cleared by the [place]  FDA and has been authorized for detection and/or diagnosis of SARS-CoV-2 by FDA under an Emergency Use Authorization (EUA). This EUA will  remain in effect (meaning this test can be used) for the duration of the COVID-19 declaration under Section 564(b)(1) of the Act, 21 U.S.C. section 360bbb-3(b)(1), unless the authorization is terminated or revoked.     Resp Syncytial Virus by PCR NEGATIVE NEGATIVE Final    Comment: (NOTE) Fact Sheet for Patients: bloggercourse.com  Fact Sheet for Healthcare Providers: seriousbroker.it  This test is not yet approved or cleared by the [place]  FDA and has been authorized for detection and/or diagnosis of SARS-CoV-2 by FDA under an Emergency Use Authorization (EUA). This EUA will remain in effect (meaning this test can be used) for the duration of the COVID-19 declaration under Section 564(b)(1) of the Act, 21 U.S.C. section 360bbb-3(b)(1), unless the authorization is terminated or revoked.  Performed at [facility], [address], [place], [place] [address]      Radiology Studies: DG CHEST PORT 1 VIEW Result Date: 05/06/2024 EXAM: 1 VIEW(S) XRAY OF THE CHEST 05/06/2024 06:41:00 AM COMPARISON: 05/04/2024 CLINICAL  HISTORY: The patient is experiencing shortness of breath (SOB). FINDINGS: LINES, TUBES AND DEVICES: Left atrial appendage occlusion device. LUNGS AND PLEURA: Small bilateral pleural effusions and bibasilar atelectasis, decreased compared to prior. Chronic interstitial markings. Mild pulmonary edema. No pneumothorax. HEART AND MEDIASTINUM: Cardiomegaly. Aortic atherosclerosis. BONES AND SOFT TISSUES: Chronic right rib fracture. IMPRESSION: 1. Mild pulmonary edema. 2. Small bilateral pleural effusions and bibasilar atelectasis, decreased compared to prior. 3. Chronic interstitial coarsening, similar . 4. Cardiomegaly. Electronically signed by: Waddell Calk MD 05/06/2024 07:48 AM EST RP Workstation: [id number]   ECHOCARDIOGRAM COMPLETE Result Date: 05/05/2024    ECHOCARDIOGRAM REPORT   Patient Name:   Bianca Wolf  Date of Exam: 05/05/2024 Medical Rec #:  [id number]      Height:       63.0 in Accession #:    [id number]     Weight:       158.3 lb Date of Birth:  04/12/1941      BSA:          1.751 m Patient Age:    83 years       BP:           172/71 mmHg Patient Gender: F              HR:           52 bpm. Exam Location:  Inpatient Procedure: 2D Echo, Cardiac Doppler and Color Doppler (Both Spectral and Color            Flow Doppler were utilized during procedure). Indications:    Dyspnea  History:        Patient has prior history of Echocardiogram examinations, most                 recent 02/07/2022. COPD, Arrythmias:Atrial Fibrillation; Risk                 Factors:Dyslipidemia and Hypertension.  Sonographer:    Carmelita Hartshorn RDCS, FE, PE Referring Phys: [id number] CLARETTA HERO AMPONSAH IMPRESSIONS  1. Left ventricular ejection fraction, by estimation, is 60 to 65%. The left ventricle has normal function. The left ventricle has no regional wall motion abnormalities. There is moderate concentric left ventricular hypertrophy. Left ventricular diastolic parameters are consistent with Grade II diastolic dysfunction (pseudonormalization).  2. Right ventricular systolic function is normal. The right ventricular size is normal. There is mildly elevated pulmonary artery systolic pressure. The estimated right ventricular systolic pressure is 40.7 mmHg.  3. Left atrial size was moderately dilated.  4. The mitral valve is grossly normal. Mild mitral valve regurgitation. No evidence of mitral stenosis.  5. The aortic valve is grossly normal. Aortic valve regurgitation is not visualized. Aortic valve sclerosis is present, with no evidence of aortic valve stenosis.  6. The inferior vena cava is dilated in size with >50% respiratory variability, suggesting right atrial pressure of 8 mmHg. Comparison(s): Changes from prior study are noted. The left ventricular function has improved. FINDINGS  Left Ventricle: Left ventricular ejection fraction, by  estimation, is 60 to 65%. The left ventricle has normal function. The left ventricle has no regional wall motion abnormalities. The left ventricular internal cavity size was normal in size. There is  moderate concentric left ventricular hypertrophy. Left ventricular diastolic parameters are consistent with Grade II diastolic dysfunction (pseudonormalization). Right Ventricle: The right ventricular size is normal. No increase in right ventricular wall thickness. Right ventricular systolic function is normal. There is mildly elevated pulmonary artery systolic pressure. The tricuspid  regurgitant velocity is 2.86  m/s, and with an assumed right atrial pressure of 8 mmHg, the estimated right ventricular systolic pressure is 40.7 mmHg. Left Atrium: Left atrial size was moderately dilated. Right Atrium: Right atrial size was normal in size. Pericardium: There is no evidence of pericardial effusion. Mitral Valve: The mitral valve is grossly normal. Mild mitral valve regurgitation. No evidence of mitral valve stenosis. Tricuspid Valve: The tricuspid valve is grossly normal. Tricuspid valve regurgitation is mild . No evidence of tricuspid stenosis. Aortic Valve: The aortic valve is grossly normal. Aortic valve regurgitation is not visualized. Aortic valve sclerosis is present, with no evidence of aortic valve stenosis. Aortic valve mean gradient measures 9.0 mmHg. Aortic valve peak gradient measures 17.1 mmHg. Aortic valve area, by VTI measures 2.10 cm. Pulmonic Valve: The pulmonic valve was grossly normal. Pulmonic valve regurgitation is not visualized. No evidence of pulmonic stenosis. Aorta: The aortic root is normal in size and structure. Venous: The inferior vena cava is dilated in size with greater than 50% respiratory variability, suggesting right atrial pressure of 8 mmHg. IAS/Shunts: The atrial septum is grossly normal.  LEFT VENTRICLE PLAX 2D LVIDd:         3.70 cm   Diastology LVIDs:         2.50 cm   LV e'  medial:    6.53 cm/s LV PW:         1.30 cm   LV E/e' medial:  16.5 LV IVS:        1.40 cm   LV e' lateral:   10.60 cm/s LVOT diam:     1.97 cm   LV E/e' lateral: 10.2 LV SV:         102 LV SV Index:   58 LVOT Area:     3.05 cm  RIGHT VENTRICLE             IVC RV Basal diam:  3.37 cm     IVC diam: 2.14 cm RV Mid diam:    2.72 cm RV S prime:     14.40 cm/s TAPSE (M-mode): 1.6 cm LEFT ATRIUM             Index        RIGHT ATRIUM           Index LA diam:        4.50 cm 2.57 cm/m   RA Area:     17.70 cm LA Vol (A2C):   62.8 ml 35.87 ml/m  RA Volume:   43.80 ml  25.02 ml/m LA Vol (A4C):   75.2 ml 42.95 ml/m LA Biplane Vol: 70.6 ml 40.33 ml/m  AORTIC VALVE AV Area (Vmax):    2.08 cm AV Area (Vmean):   2.07 cm AV Area (VTI):     2.10 cm AV Vmax:           207.00 cm/s AV Vmean:          134.000 cm/s AV VTI:            0.484 m AV Peak Grad:      17.1 mmHg AV Mean Grad:      9.0 mmHg LVOT Vmax:         141.00 cm/s LVOT Vmean:        90.800 cm/s LVOT VTI:          0.334 m LVOT/AV VTI ratio: 0.69  AORTA Ao Root diam: 2.79 cm MITRAL VALVE  TRICUSPID VALVE MV Area (PHT): 2.87 cm     TR Peak grad:   32.7 mmHg MV Decel Time: 264 msec     TR Vmax:        286.00 cm/s MV E velocity: 108.00 cm/s MV A velocity: 68.10 cm/s   SHUNTS MV E/A ratio:  1.59         Systemic VTI:  0.33 m                             Systemic Diam: 1.97 cm Darryle Decent MD Electronically signed by Darryle Decent MD Signature Date/Time: 05/05/2024/12:28:29 PM    Final    DG Chest 2 View Result Date: 05/04/2024 EXAM: 2 VIEW(S) XRAY OF THE CHEST 05/04/2024 02:11:00 PM COMPARISON: 03/28/2024 CLINICAL HISTORY: SOB FINDINGS: LINES, TUBES AND DEVICES: Left atrial appendage occlusion device noted. LUNGS AND PLEURA: Bibasilar interstitial and airspace opacities. Mild pulmonary edema. Small bilateral pleural effusions. No pneumothorax. HEART AND MEDIASTINUM: Mild cardiomegaly. Aortic atherosclerosis. BONES AND SOFT TISSUES: Multilevel thoracic  osteophytosis. Chronic right posterior rib fracture. IMPRESSION: 1. Bibasilar interstitial and airspace opacities with mild pulmonary edema and small bilateral pleural effusions. 2. Mild cardiomegaly. Electronically signed by: Waddell Calk MD MD 05/04/2024 03:42 PM EST RP Workstation: [id number]     Scheduled Meds:  arformoterol   15 mcg Nebulization BID   And   umeclidinium bromide   1 puff Inhalation Daily   enoxaparin  (LOVENOX ) injection  40 mg Subcutaneous Q24H   furosemide   40 mg Intravenous Daily   irbesartan   150 mg Oral Daily   Continuous Infusions:   LOS: 2 days    Time spent:    Sigurd Pac, MD [facility]   05/06/2024, 1:02 PM    "

## 2024-05-06 NOTE — Progress Notes (Signed)
 Nyle Breaker, OT  Occupational Therapist Specialty: Occupational Therapy   Progress Notes     Signed   Date of Service: 05/06/2024 12:43 PM   Signed      SATURATION QUALIFICATIONS: (This note is used to comply with regulatory documentation for home oxygen )   Patient Saturations on Room Air at Rest = 85-88%   Patient Saturations on 2 Liters of oxygen  while at rest= 95%   Patient Saturations on 2 Liters of oxygen  while at Ambulating = 90-95%   Breaker POUR OTR/L  Acute Rehab Services  [phone] office number

## 2024-05-06 NOTE — Telephone Encounter (Signed)
" ° °  Hello monitor team, Dr. Delford has requested 14 day non live Zio monitor on this patient for diagnosis of bradycardia, Dr. Kate (primary cardiologist) to read. Patient aware it will be mailed to their house - Dr. Delford spoke with daughter as well. Thank you!  "

## 2024-05-07 ENCOUNTER — Telehealth: Payer: Self-pay

## 2024-05-07 NOTE — Transitions of Care (Post Inpatient/ED Visit) (Signed)
" ° °  05/07/2024  Name: DORISTINE SHEHAN MRN: [id number] DOB: 04-12-1941  Today's TOC FU Call Status: Today's TOC FU Call Status:: Unsuccessful Call (1st Attempt) Unsuccessful Call (1st Attempt) Date: 05/07/24  Attempted to reach the patient regarding the most recent Inpatient/ED visit. Left VM  Follow Up Plan: Additional outreach attempts will be made to reach the patient to complete the Transitions of Care (Post Inpatient/ED visit) call.   Alan Ee, RN, BSN, CEN Population Health- Transition of Care Team.  [facility] [phone]  "

## 2024-05-08 ENCOUNTER — Telehealth: Payer: Self-pay

## 2024-05-08 NOTE — Transitions of Care (Post Inpatient/ED Visit) (Signed)
" ° °  05/08/2024  Name: ISSABELLE MCRANEY MRN: [id number] DOB: 05/06/1940  Today's TOC FU Call Status: Today's TOC FU Call Status:: Unsuccessful Call (2nd Attempt) Unsuccessful Call (2nd Attempt) Date: 05/08/24  Attempted to reach the patient regarding the most recent Inpatient/ED visit.  Follow Up Plan: Additional outreach attempts will be made to reach the patient to complete the Transitions of Care (Post Inpatient/ED visit) call.   Maimouna Rondeau J. Kemet Nijjar RN, MSN [facility], [facility] RN Care Manager Direct Dial: [phone]  Fax: [phone] Website: [email or web address]   "

## 2024-05-11 ENCOUNTER — Telehealth: Payer: Self-pay

## 2024-05-11 NOTE — Transitions of Care (Post Inpatient/ED Visit) (Signed)
" ° °  05/11/2024  Name: Bianca Wolf MRN: [id number] DOB: 08-11-1940  Today's TOC FU Call Status: Today's TOC FU Call Status:: Unsuccessful Call (3rd Attempt) Unsuccessful Call (3rd Attempt) Date: 05/11/24  Attempted to reach the patient regarding the most recent Inpatient/ED visit.  Follow Up Plan: No further outreach attempts will be made at this time. We have been unable to contact the patient.  Rinoa Garramone J. December Hedtke RN, MSN [facility], [facility] RN Care Manager Direct Dial: [phone]  Fax: [phone] Website: [email or web address]   "

## 2024-05-11 NOTE — Progress Notes (Unsigned)
 " Cardiology Office Note:    Date:  05/14/2024   ID:  Bianca, Wolf March 27, 1941, MRN [id number]  PCP:  Clarice Nottingham, MD  Cardiologist:  Lonni LITTIE Nanas, MD  Electrophysiologist:  None   Referring MD: Clarice Nottingham, MD   Chief Complaint  Patient presents with   Atrial Fibrillation    History of Present Illness:    Bianca Wolf is a 84 y.o. female with a hx of persistent atrial fibrillation, hypertension who presents for follow-up.  She was admitted 02/06/2022 with palpitations, found to have new onset atrial fibrillation with RVR.  Echocardiogram showed EF 45 to 50%, mild RV systolic dysfunction.  She was started on Eliquis  and metoprolol .  Underwent successful TEE/DCCV on 02/09/2022.  Echocardiogram 06/05/2022 showed EF 65 to 70%, moderate LVH, grade 2 diastolic dysfunction, normal RV function, moderately elevated pulmonary pressures, severe left atrial enlargement, mild right atrial enlargement.  Pre-Watchman CT showed calcium score 256 (63rd percentile).  Underwent Watchman 01/2023.  Plavix  discontinued 08/2023.   She was admitted with hypoxia likely due to COPD and acute on chronic diastolic heart failure 04/2024.  Also noted to be bradycardic during admission metoprolol  and amiodarone  were discontinued.  Echocardiogram 05/05/2024 showed EF 60 to 65%, normal RV function, no significant valvular disease.  She was discharged on p.o. Lasix  and with cardiac monitor.  Since discharge from hospital, she reports she is doing well.  Reports dyspnea and lower extremity edema have improved. Denies any chest pain, lightheadedness, syncope, or palpitations.    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 Cindie    Past Surgical  History:  Procedure Laterality Date   BUBBLE STUDY  02/09/2022   Procedure: BUBBLE STUDY;  Surgeon: Loni Soyla LABOR, MD;  Location: [facility] ENDOSCOPY;  Service: Cardiovascular;;   CARDIOVERSION N/A 02/09/2022   Procedure: CARDIOVERSION;  Surgeon: Loni Soyla LABOR, MD;  Location: [facility] ENDOSCOPY;  Service: Cardiovascular;  Laterality: N/A;   CARDIOVERSION N/A 10/01/2022   Procedure: CARDIOVERSION;  Surgeon: Shlomo Wilbert SAUNDERS, MD;  Location: [facility] INVASIVE CV LAB;  Service: Cardiovascular;  Laterality: N/A;   IR KYPHO LUMBAR INC FX REDUCE BONE BX UNI/BIL CANNULATION INC/IMAGING  09/21/2021   LEFT ATRIAL APPENDAGE OCCLUSION N/A 02/14/2023   Procedure: LEFT ATRIAL APPENDAGE OCCLUSION;  Surgeon: Cindie Ole DASEN, MD;  Location: [facility] INVASIVE CV LAB;  Service: Cardiovascular;  Laterality: N/A;   TEE WITHOUT CARDIOVERSION N/A 02/09/2022   Procedure: TRANSESOPHAGEAL ECHOCARDIOGRAM (TEE);  Surgeon: Loni Soyla LABOR, MD;  Location: [facility] ENDOSCOPY;  Service: Cardiovascular;  Laterality: N/A;   TEE WITHOUT CARDIOVERSION N/A 02/14/2023   Procedure: TRANSESOPHAGEAL ECHOCARDIOGRAM;  Surgeon: Cindie Ole DASEN, MD;  Location: [facility] INVASIVE CV LAB;  Service: Cardiovascular;  Laterality: N/A;    Current Medications: Current Meds  Medication Sig   albuterol  (VENTOLIN  HFA) 108 (90 Base) MCG/ACT inhaler Inhale 2 puffs into the lungs every 6 (six) hours as needed for wheezing or shortness of breath.   Calcium Citrate-Vitamin D3 (CITRACAL MAXIMUM) 315-6.25 MG-MCG TABS Take 1 tablet by mouth in the morning.   Cholecalciferol 125 MCG (5000 UT) TABS Take 5,000 Units by mouth in the morning.   cyanocobalamin  2000 MCG tablet Take 2,000 mcg by mouth in the morning.   furosemide  (  LASIX ) 20 MG tablet Take 1 tablet (20 mg total) by mouth daily.   olmesartan (BENICAR) 20 MG tablet Take 20 mg by mouth daily.   omeprazole (PRILOSEC) 20 MG capsule TAKE 1 CAPSULE BY MOUTH ONCE DAILY ONE-HALF  TO  ONE  HOUR  BEFORE  MORNING  MEAL;  Duration: 90   Tiotropium Bromide-Olodaterol (STIOLTO RESPIMAT ) 2.5-2.5 MCG/ACT AERS Inhale 2 puffs into the lungs daily.     Allergies:   Patient has no known allergies.   Social History   Socioeconomic History   Marital status: Widowed    Spouse name: Not on file   Number of children: Not on file   Years of education: Not on file   Highest education level: Not on file  Occupational History   Not on file  Tobacco Use   Smoking status: Former    Current packs/day: 0.00    Average packs/day: 1 pack/day for 25.0 years (25.0 ttl pk-yrs)    Types: Cigarettes    Start date: 96    Quit date: 48    Years since quitting: 46.0   Smokeless tobacco: Never  Vaping Use   Vaping status: Never Used  Substance and Sexual Activity   Alcohol use: No   Drug use: No   Sexual activity: Yes    Birth control/protection: None  Other Topics Concern   Not on file  Social History Narrative   Not on file   Social Drivers of Health   Tobacco Use: Medium Risk (05/14/2024)   Patient History    Smoking Tobacco Use: Former    Smokeless Tobacco Use: Never    Passive Exposure: Not on Actuary Strain: Not on file  Food Insecurity: No Food Insecurity (05/06/2024)   Epic    Worried About Programme Researcher, Broadcasting/film/video in the Last Year: Never true    Ran Out of Food in the Last Year: Never true  Transportation Needs: No Transportation Needs (05/06/2024)   Epic    Lack of Transportation (Medical): No    Lack of Transportation (Non-Medical): No  Physical Activity: Not on file  Stress: Not on file  Social Connections: Unknown (05/06/2024)   Social Connection and Isolation Panel    Frequency of Communication with Friends and Family: More than three times a week    Frequency of Social Gatherings with Friends and Family: More than three times a week    Attends Religious Services: Not on Marketing Executive or Organizations: Not on file    Attends Banker Meetings: Not on  file    Marital Status: Not on file  Recent Concern: Social Connections - Moderately Isolated (03/29/2024)   Social Connection and Isolation Panel    Frequency of Communication with Friends and Family: More than three times a week    Frequency of Social Gatherings with Friends and Family: More than three times a week    Attends Religious Services: More than 4 times per year    Active Member of [employer] or Organizations: No    Attends Banker Meetings: Never    Marital Status: Widowed  Depression (PHQ2-9): Low Risk (03/31/2024)   Depression (PHQ2-9)    PHQ-2 Score: 0  Alcohol Screen: Not on file  Housing: Low Risk (05/06/2024)   Epic    Unable to Pay for Housing in the Last Year: No    Number of Times Moved in the Last Year: 0    Homeless in the  Last Year: No  Utilities: Not At Risk (05/06/2024)   Epic    Threatened with loss of utilities: No  Health Literacy: Not on file     Family History: The patient's family history includes Diabetes in her mother; Heart disease in her father; Hypertension in her father and mother. There is no history of Atrial fibrillation.  ROS:   Please see the history of present illness.     All other systems reviewed and are negative.  EKGs/Labs/Other Studies Reviewed:    The following studies were reviewed today:   EKG:   05/14/2024: Sinus rhythm, PACs, rate 60 08/23/22: Afib, rate 101 05/14/2022: Sinus rhythm with PACs, rate 60 02/15/22: Sinus bradycardia, rate 54, no ST abnormalities  Recent Labs: 05/04/2024: Pro Brain Natriuretic Peptide 3,051.0 05/05/2024: TSH 3.360 05/06/2024: ALT 9; BUN 12; Creatinine, Ser 1.01; Hemoglobin 10.5; Magnesium  1.7; Platelets 202; Potassium 3.8; Sodium 142  Recent Lipid Panel No results found for: CHOL, TRIG, HDL, CHOLHDL, VLDL, LDLCALC, LDLDIRECT  Physical Exam:    VS:  BP (!) 150/64 (BP Location: Left Arm, Patient Position: Sitting, Cuff Size: Normal)   Pulse 62   Ht 5' 3 (1.6 m)   Wt  157 lb (71.2 kg)   SpO2 93%   BMI 27.81 kg/m     Wt Readings from Last 3 Encounters:  05/14/24 157 lb (71.2 kg)  05/06/24 153 lb 14.1 oz (69.8 kg)  03/02/24 158 lb 6.4 oz (71.8 kg)     GEN:  in no acute distress HEENT: Normal NECK: No JVD; No carotid bruits CARDIAC: bradycardic, regular, no murmurs, rubs, gallops RESPIRATORY:  Clear to auscultation without rales, wheezing or rhonchi  ABDOMEN: Soft, non-tender, non-distended MUSCULOSKELETAL:  trace edema; No deformity  SKIN: Warm and dry NEUROLOGIC:  Alert and oriented x 3 PSYCHIATRIC:  Normal affect   ASSESSMENT:    1. Persistent atrial fibrillation (HCC)   2. Chronic diastolic heart failure (HCC)   3. Coronary artery disease involving native coronary artery of native heart without angina pectoris   4. Bradycardia   5. Essential hypertension     PLAN:    Persistent atrial fibrillation: She was admitted 02/06/2022 with palpitations, found to have new onset atrial fibrillation with RVR.  Echocardiogram showed EF 45 to 50%, mild RV systolic dysfunction.  She was started on Eliquis  and metoprolol .  Underwent successful TEE/DCCV on 02/09/2022.  CHA2DS2-VASc score 5 (CHF, hypertension, age x2, female).  At follow-up visit in 08/2022 she was back in atrial fibrillation.  Referred to A-fib clinic and started on amiodarone , converted to sinus rhythm.  Status post Watchman 01/2023. - Metoprolol  and amiodarone  discontinued during admission 04/2024 due to bradycardia.  Discharged with cardiac monitor; she brought monitor with her today, has not worn.  Will have monitor placed in clinic today  Chronic diastolic heart failure: EF 45 to 50% on echo 02/07/2022.  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  Echocardiogram 06/05/2022 showed EF 65 to 70%, moderate LVH, grade 2 diastolic dysfunction, normal RV function, moderately elevated pulmonary pressures, severe left atrial enlargement, mild right atrial enlargement.  Admitted with acute on chronic  diastolic heart failure 04/2024, discharged on p.o. Lasix  -Metoprolol  discontinued due to bradycardia -Continue olmesartan 20 mg daily -Continue Lasix  20 mg daily.  Appears euvolemic.  Check BMET, magnesium   CAD: Calcium score 256 (63rd percentile) on prewatchman CT 11/2022.  Has declined statin  Hypertension: Continue olmesartan 20 mg daily.  BP elevated in clinic today, asked to check BP twice daily for next week  and let us  know results.  Daytime somnolence: Recommended [place] sleep study but patient did not have done.  STOPBANG 4.  She is declining sleep study at this time   RTC in 3 months   Medication Adjustments/Labs and Tests Ordered: Current medicines are reviewed at length with the patient today.  Concerns regarding medicines are outlined above.  Orders Placed This Encounter  Procedures   Basic Metabolic Panel (BMET)   Magnesium    EKG 12-Lead   No orders of the defined types were placed in this encounter.   Patient Instructions  Medication Instructions:  Your physician recommends that you continue on your current medications as directed. Please refer to the Current Medication list given to you today.  *If you need a refill on your cardiac medications before your next appointment, please call your pharmacy*  Lab Work: Bmet, mg today If you have labs (blood work) drawn today and your tests are completely normal, you will receive your results only by: MyChart Message (if you have MyChart) OR A paper copy in the mail If you have any lab test that is abnormal or we need to change your treatment, we will call you to review the results.  Testing/Procedures: none  Follow-Up: At [facility], you and your health needs are our priority.  As part of our continuing mission to provide you with exceptional heart care, our providers are all part of one team.  This team includes your primary Cardiologist (physician) and Advanced Practice Providers or APPs (Physician  Assistants and Nurse Practitioners) who all work together to provide you with the care you need, when you need it.  Your next appointment:   3 months  Provider:   Dr. Kate  We recommend signing up for the patient portal called MyChart.  Sign up information is provided on this After Visit Summary.  MyChart is used to connect with patients for Virtual Visits (Telemedicine).  Patients are able to view lab/test results, encounter notes, upcoming appointments, etc.  Non-urgent messages can be sent to your provider as well.   To learn more about what you can do with MyChart, go to [email or web address].   Other Instructions Please check blood pressure twice a day for one week and send those reading            Signed, Lonni LITTIE Kate, MD  05/14/2024 12:57 PM    [facility] "

## 2024-05-14 ENCOUNTER — Encounter: Payer: Self-pay | Admitting: Cardiology

## 2024-05-14 ENCOUNTER — Ambulatory Visit: Attending: Cardiology | Admitting: Cardiology

## 2024-05-14 VITALS — BP 150/64 | HR 62 | Ht 63.0 in | Wt 157.0 lb

## 2024-05-14 DIAGNOSIS — I2510 Atherosclerotic heart disease of native coronary artery without angina pectoris: Secondary | ICD-10-CM | POA: Diagnosis not present

## 2024-05-14 DIAGNOSIS — I5032 Chronic diastolic (congestive) heart failure: Secondary | ICD-10-CM

## 2024-05-14 DIAGNOSIS — R001 Bradycardia, unspecified: Secondary | ICD-10-CM | POA: Diagnosis not present

## 2024-05-14 DIAGNOSIS — I10 Essential (primary) hypertension: Secondary | ICD-10-CM | POA: Diagnosis not present

## 2024-05-14 DIAGNOSIS — I4819 Other persistent atrial fibrillation: Secondary | ICD-10-CM | POA: Diagnosis not present

## 2024-05-14 LAB — BASIC METABOLIC PANEL WITH GFR
BUN/Creatinine Ratio: 12 (ref 12–28)
BUN: 11 mg/dL (ref 8–27)
CO2: 24 mmol/L (ref 20–29)
Calcium: 9.2 mg/dL (ref 8.7–10.3)
Chloride: 98 mmol/L (ref 96–106)
Creatinine, Ser: 0.92 mg/dL (ref 0.57–1.00)
Glucose: 82 mg/dL (ref 70–99)
Potassium: 4 mmol/L (ref 3.5–5.2)
Sodium: 140 mmol/L (ref 134–144)
eGFR: 61 mL/min/1.73

## 2024-05-14 LAB — MAGNESIUM: Magnesium: 1.9 mg/dL (ref 1.6–2.3)

## 2024-05-14 NOTE — Patient Instructions (Signed)
 Medication Instructions:  Your physician recommends that you continue on your current medications as directed. Please refer to the Current Medication list given to you today.  *If you need a refill on your cardiac medications before your next appointment, please call your pharmacy*  Lab Work: Bmet, mg today If you have labs (blood work) drawn today and your tests are completely normal, you will receive your results only by: MyChart Message (if you have MyChart) OR A paper copy in the mail If you have any lab test that is abnormal or we need to change your treatment, we will call you to review the results.  Testing/Procedures: none  Follow-Up: At [facility], you and your health needs are our priority.  As part of our continuing mission to provide you with exceptional heart care, our providers are all part of one team.  This team includes your primary Cardiologist (physician) and Advanced Practice Providers or APPs (Physician Assistants and Nurse Practitioners) who all work together to provide you with the care you need, when you need it.  Your next appointment:   3 months  Provider:   Dr. Kate  We recommend signing up for the patient portal called MyChart.  Sign up information is provided on this After Visit Summary.  MyChart is used to connect with patients for Virtual Visits (Telemedicine).  Patients are able to view lab/test results, encounter notes, upcoming appointments, etc.  Non-urgent messages can be sent to your provider as well.   To learn more about what you can do with MyChart, go to [email or web address].   Other Instructions Please check blood pressure twice a day for one week and send those reading

## 2024-05-15 ENCOUNTER — Ambulatory Visit: Payer: Self-pay | Admitting: Cardiology

## 2024-05-21 LAB — LAB REPORT - SCANNED
EGFR: 52
Free T4: 1.11 ng/dL
TSH: 3.37 (ref 0.41–5.90)

## 2024-05-22 ENCOUNTER — Telehealth: Payer: Self-pay | Admitting: Cardiology

## 2024-05-22 NOTE — Telephone Encounter (Signed)
 Reached operator with [employer], sent to [facility] and was told I could leave a detailed message. Updated Heron that this pt was getting Lasix  20mg  daily. Left [facility] number for any further questions.  Closing encounter

## 2024-05-22 NOTE — Telephone Encounter (Signed)
 Pt c/o medication issue:  1. Name of Medication:   furosemide  (LASIX ) 20 MG tablet    2. How are you currently taking this medication (dosage and times per day)?   3. Are you having a reaction (difficulty breathing--STAT)?   4. What is your medication issue? Heron with [facility] calling for clarification on this medications dosage. Please advise.

## 2024-07-20 ENCOUNTER — Ambulatory Visit: Admitting: Pulmonary Disease

## 2024-08-17 ENCOUNTER — Ambulatory Visit: Admitting: Cardiology
